# Patient Record
Sex: Female | Born: 1960 | Race: Black or African American | Hispanic: No | Marital: Married
Health system: Northeastern US, Academic
[De-identification: ages and names within clinical notes are randomized; demographics above are authoritative.]

## PROBLEM LIST (undated history)

## (undated) DIAGNOSIS — I10 Essential (primary) hypertension: Secondary | ICD-10-CM

## (undated) DIAGNOSIS — E119 Type 2 diabetes mellitus without complications: Secondary | ICD-10-CM

## (undated) DIAGNOSIS — G43909 Migraine, unspecified, not intractable, without status migrainosus: Secondary | ICD-10-CM

## (undated) DIAGNOSIS — J45909 Unspecified asthma, uncomplicated: Secondary | ICD-10-CM

## (undated) DIAGNOSIS — R0600 Dyspnea, unspecified: Secondary | ICD-10-CM

## (undated) HISTORY — PX: ABDOMINAL HYSTERECTOMY: SHX81

## (undated) HISTORY — PX: COLONOSCOPY: SHX5424

## (undated) HISTORY — PX: TOTAL ABDOMINAL HYSTERECTOMY: SHX209

## (undated) HISTORY — PX: CYSTECTOMY: SUR359

---

## 2014-08-25 ENCOUNTER — Encounter: Payer: Self-pay | Admitting: Emergency Medicine

## 2014-08-25 ENCOUNTER — Emergency Department
Admission: EM | Admit: 2014-08-25 | Discharge: 2014-08-25 | Disposition: A | Discharge status: Home / Self Care | Payer: 59 | Attending: Emergency Medicine | Admitting: Emergency Medicine

## 2014-08-25 ENCOUNTER — Emergency Department: Payer: 59

## 2014-08-25 ENCOUNTER — Other Ambulatory Visit: Payer: Self-pay

## 2014-08-25 DIAGNOSIS — R079 Chest pain, unspecified: Secondary | ICD-10-CM | POA: Diagnosis not present

## 2014-08-25 DIAGNOSIS — F329 Major depressive disorder, single episode, unspecified: Secondary | ICD-10-CM | POA: Diagnosis not present

## 2014-08-25 DIAGNOSIS — Z79899 Other long term (current) drug therapy: Secondary | ICD-10-CM | POA: Insufficient documentation

## 2014-08-25 DIAGNOSIS — I10 Essential (primary) hypertension: Secondary | ICD-10-CM | POA: Insufficient documentation

## 2014-08-25 DIAGNOSIS — E119 Type 2 diabetes mellitus without complications: Secondary | ICD-10-CM | POA: Diagnosis not present

## 2014-08-25 DIAGNOSIS — Z87891 Personal history of nicotine dependence: Secondary | ICD-10-CM | POA: Diagnosis not present

## 2014-08-25 HISTORY — DX: Type 2 diabetes mellitus without complications: E11.9

## 2014-08-25 HISTORY — DX: Essential (primary) hypertension: I10

## 2014-08-25 HISTORY — DX: Migraine, unspecified, not intractable, without status migrainosus: G43.909

## 2014-08-25 LAB — CBC
HEMATOCRIT: 36.7 % (ref 35.0–47.0)
Hemoglobin: 11.6 g/dL — ABNORMAL LOW (ref 12.0–16.0)
MCH: 26.8 pg (ref 26.0–34.0)
MCHC: 31.6 g/dL — ABNORMAL LOW (ref 32.0–36.0)
MCV: 84.7 fL (ref 80.0–100.0)
Platelets: 257 10*3/uL (ref 150–440)
RBC: 4.34 MIL/uL (ref 3.80–5.20)
RDW: 14.8 % — AB (ref 11.5–14.5)
WBC: 6.7 10*3/uL (ref 3.6–11.0)

## 2014-08-25 LAB — BASIC METABOLIC PANEL
ANION GAP: 8 (ref 5–15)
BUN: 19 mg/dL (ref 6–20)
CO2: 27 mmol/L (ref 22–32)
Calcium: 9 mg/dL (ref 8.9–10.3)
Chloride: 105 mmol/L (ref 101–111)
Creatinine, Ser: 0.99 mg/dL (ref 0.44–1.00)
GFR calc non Af Amer: >60 mL/min (ref >60)
Glucose, Bld: 167 mg/dL — ABNORMAL HIGH (ref 65–99)
POTASSIUM: 3.1 mmol/L — AB (ref 3.5–5.1)
Sodium: 140 mmol/L (ref 135–145)

## 2014-08-25 LAB — TROPONIN I

## 2014-08-25 MED ORDER — METOPROLOL TARTRATE 25 MG PO TABS
ORAL_TABLET | ORAL | Status: AC
  Administered 2014-08-25: 25 mg via ORAL
  Filled 2014-08-25: qty 1

## 2014-08-25 MED ORDER — METOPROLOL TARTRATE 25 MG PO TABS
25.0000 mg | ORAL_TABLET | Freq: Once | ORAL | Status: AC
  Administered 2014-08-25: 25 mg via ORAL
  Filled 2014-08-25: qty 1

## 2014-08-25 MED ORDER — METOPROLOL TARTRATE 25 MG PO TABS: 25.0000 mg | ORAL_TABLET | Freq: Two times a day (BID) | ORAL | Status: DC

## 2014-08-25 NOTE — ED Notes (Signed)
Pt reports that she developed mid-sternal chest pain yesterday. She states that it feels like an elephant sitting on her chest. Denies N/V, SOB or Diaphoresis. She has had problems with her K+.

## 2014-08-25 NOTE — Discharge Instructions (Signed)
No certain cause was found for your chest pain however your exam and evaluation are reassuring. Potassium is slightly low sodium, continue your potassium supplementation. Due to the you not being able to tolerate the metoprolol dose at 50 mg, you're being prescribed the metoprolol 25 mg twice a day dose. Discussed this with your current primary care doctor, or your new primary care doctor and you're referred to see the [facility]. With resected chest pain, I have recommended calling the cardiologist office here, or your previous cardiologist for appointment in 1-2 days for reevaluation for chest pain. Return to the emergency room for any new or worsening chest pain, nausea, shortness breath, trouble breathing, weakness, numbness, passing out, or any other symptoms concerning to you.  Chest Pain (Nonspecific) It is often hard to give a specific diagnosis for the cause of chest pain. There is always a chance that your pain could be related to something serious, such as a heart attack or a blood clot in the lungs. You need to follow up with your health care provider for further evaluation. CAUSES   Heartburn.  Pneumonia or bronchitis.  Anxiety or stress.  Inflammation around your heart (pericarditis) or lung (pleuritis or pleurisy).  A blood clot in the lung.  A collapsed lung (pneumothorax). It can develop suddenly on its own (spontaneous pneumothorax) or from trauma to the chest.  Shingles infection (herpes zoster virus). The chest wall is composed of bones, muscles, and cartilage. Any of these can be the source of the pain.  The bones can be bruised by injury.  The muscles or cartilage can be strained by coughing or overwork.  The cartilage can be affected by inflammation and become sore (costochondritis). DIAGNOSIS  Lab tests or other studies may be needed to find the cause of your pain. Your health care provider may have you take a test called an ambulatory electrocardiogram (ECG).  An ECG records your heartbeat patterns over a 24-hour period. You may also have other tests, such as:  Transthoracic echocardiogram (TTE). During echocardiography, sound waves are used to evaluate how blood flows through your heart.  Transesophageal echocardiogram (TEE).  Cardiac monitoring. This allows your health care provider to monitor your heart rate and rhythm in real time.  Holter monitor. This is a portable device that records your heartbeat and can help diagnose heart arrhythmias. It allows your health care provider to track your heart activity for several days, if needed.  Stress tests by exercise or by giving medicine that makes the heart beat faster. TREATMENT   Treatment depends on what may be causing your chest pain. Treatment may include:  Acid blockers for heartburn.  Anti-inflammatory medicine.  Pain medicine for inflammatory conditions.  Antibiotics if an infection is present.  You may be advised to change lifestyle habits. This includes stopping smoking and avoiding alcohol, caffeine, and chocolate.  You may be advised to keep your head raised (elevated) when sleeping. This reduces the chance of acid going backward from your stomach into your esophagus. Most of the time, nonspecific chest pain will improve within 2-3 days with rest and mild pain medicine.  HOME CARE INSTRUCTIONS   If antibiotics were prescribed, take them as directed. Finish them even if you start to feel better.  For the next few days, avoid physical activities that bring on chest pain. Continue physical activities as directed.  Do not use any tobacco products, including cigarettes, chewing tobacco, or electronic cigarettes.  Avoid drinking alcohol.  Only take medicine as  directed by your health care provider.  Follow your health care provider's suggestions for further testing if your chest pain does not go away.  Keep any follow-up appointments you made. If you do not go to an  appointment, you could develop lasting (chronic) problems with pain. If there is any problem keeping an appointment, call to reschedule. SEEK MEDICAL CARE IF:   Your chest pain does not go away, even after treatment.  You have a rash with blisters on your chest.  You have a fever. SEEK IMMEDIATE MEDICAL CARE IF:   You have increased chest pain or pain that spreads to your arm, neck, jaw, back, or abdomen.  You have shortness of breath.  You have an increasing cough, or you cough up blood.  You have severe back or abdominal pain.  You feel nauseous or vomit.  You have severe weakness.  You faint.  You have chills. This is an emergency. Do not wait to see if the pain will go away. Get medical help at once. Call your local emergency services (911 in [place] Do not drive yourself to the hospital. MAKE SURE YOU:   Understand these instructions.  Will watch your condition.  Will get help right away if you are not doing well or get worse. Document Released: 12/13/2004 Document Revised: 03/10/2013 Document Reviewed: 10/09/2007 [facility] Patient Information 2015 [place], [place]. This information is not intended to replace advice given to you by your health care provider. Make sure you discuss any questions you have with your health care provider.

## 2014-08-25 NOTE — ED Provider Notes (Signed)
[facility] Emergency Department Provider Note   ____________________________________________  Time seen: 6:45 PM I have reviewed the triage vital signs and the triage nursing note.  HISTORY  Chief Complaint Chest Pain   Historian Patient and significant other  HPI Lacey Schaefer is a 54 y.o. female who is been experiencing ongoing central chest pressure which is mild for about 2 days now. She has been seen by a cardiologist and her previous city of [facility] including a negative stress test about 4-5 months ago per the patient. She states she's felt like this before when she was low on potassium. She took a packet of potassium this morning. She has been under a lot of stress with taking care of her sister meaning to get her nursing home, and a recent death of her onto with numerous trips back and forth to the [facility]. She has had a little bit of depression, however she is not had any suicidal thoughts. She's had no shortness of breath or trouble breathing. She does have problems with gas and indigestion at times however she does not necessarily feel like this is due to indigestion. She had no nausea, sweating, or pleuritic chest pain. She has just recently moved back to [facility] and her primary doctor and cardiologist on [place] however she is looking to get referrals to doctors in this area. She was recently taken off of her Norvasc due to peripheral edema, and changed to metoprolol 50 mg twice daily. This was the second day and she did not like the way to metoprolol 50 mg May her feel she thought it was "too strong. She is interested in taking 25 mg twice a day.   Past Medical History  Diagnosis Date  . Diabetes mellitus without complication   . Hypertension   . Migraines     There are no active problems to display for this patient.   Past Surgical History  Procedure Laterality Date  . Abdominal hysterectomy      Current Outpatient Rx   Name  Route  Sig  Dispense  Refill  . metoprolol tartrate (LOPRESSOR) 25 MG tablet   Oral   Take 1 tablet (25 mg total) by mouth 2 (two) times daily.   28 tablet   0     Allergies Sulfa antibiotics  History reviewed. No pertinent family history.  Social History History  Substance Use Topics  . Smoking status: Former Research scientist (life sciences)  . Smokeless tobacco: Not on file  . Alcohol Use: No    Review of Systems  Constitutional: Negative for fever. Eyes: Negative for visual changes. ENT: Negative for sore throat. Cardiovascular: Negative for palpitations, exertional chest pain, or pleuritic chest pain Respiratory: Negative for shortness of breath. Gastrointestinal: Negative for abdominal pain, vomiting and diarrhea. Genitourinary: Negative for dysuria. Musculoskeletal: Negative for back pain. Skin: Negative for rash. Neurological: Negative for headaches, focal weakness or numbness.  ____________________________________________   PHYSICAL EXAM:  VITAL SIGNS: ED Triage Vitals  Enc Vitals Group     BP 08/25/14 1714 179/95 mmHg     Pulse Rate 08/25/14 1714 76     Resp 08/25/14 1714 18     Temp 08/25/14 1714 98.3 F (36.8 C)     Temp Source 08/25/14 1714 Oral     SpO2 08/25/14 1714 96 %     Weight 08/25/14 1714 189 lb (85.73 kg)     Height 08/25/14 1714 5\' 5"  (1.651 m)     Head Cir --  Peak Flow --      Pain Score 08/25/14 1715 9     Pain Loc --      Pain Edu? --      Excl. in [place]? --      Constitutional: Alert and oriented. Well appearing and in no distress. Eyes: Conjunctivae are normal. PERRL. Normal extraocular movements. ENT   Head: Normocephalic and atraumatic.   Nose: No congestion/rhinnorhea.   Mouth/Throat: Mucous membranes are moist.   Neck: No stridor. Cardiovascular: Normal rate, regular rhythm.  No murmurs, rubs, or gallops. Nontender to chest wall palpation Respiratory: Normal respiratory effort without tachypnea nor retractions. Breath  sounds are clear and equal bilaterally. No wheezes/rales/rhonchi. Gastrointestinal: Soft. No distention, no guarding, no rebound. Nontender  Genitourinary/rectal: Deferred Musculoskeletal: Nontender with normal range of motion in all extremities. No joint effusions.  No lower extremity tenderness nor edema. Neurologic:  Normal speech and language. No gross focal neurologic deficits are appreciated. Skin:  Skin is warm, dry and intact. No rash noted. Psychiatric: Mood and affect are normal. Speech and behavior are normal. Patient exhibits appropriate insight and judgment.  ____________________________________________   EKG  I, Lisa Roca, MD, the attending physician have personally viewed and interpreted this ECG.   76 bpm. Narrow QRS. Normal sinus rhythm. Normal axis. Normal ST and T-wave. ____________________________________________  LABS (pertinent positives/negatives)  White blood count normal at 6.7, hemoglobin 11.6 Medical panel significant only for slightly low potassium at 3.1 and a glucose of 157 Troponin less than 0.03  ____________________________________________  RADIOLOGY Radiologist results reviewed  Chest x-ray: Negative __________________________________________  PROCEDURES  Procedure(s) performed: None Critical Care performed: None  ____________________________________________   ED COURSE / ASSESSMENT AND PLAN  Pertinent labs & imaging results that were available during my care of the patient were reviewed by me and considered in my medical decision making (see chart for details).  Atypical chest pain without any associated symptoms and ongoing for 2 days with an normal EKG and negative troponin, I do not feel this is cardiac related. Patient was most concerned about having her potassium rechecked and it was a little bit low, however she is doing potassium supplementation on her own. I did refill a prescription for metoprolol at the lower dose which is  which was tried for blood pressure since back control. She did not take her Toprol today due to the fact that she felt like 50 mg was too much.  She has been under a lot of stress recently with some mild depression, however no need for emergency psychiatric evaluation. I will refer her to primary care here. She also is trying to decide if she is going to continue to follow with her primary care doctor in [place]. She is also referred to follow-up with a cardiologist either here or in [facility].    ___________________________________________   FINAL CLINICAL IMPRESSION(S) / ED DIAGNOSES   Final diagnoses:  Chest pain, unspecified chest pain type      Lisa Roca, MD 08/25/14 [id number]

## 2014-08-25 NOTE — ED Notes (Signed)
Pt states chest pain in mid chest for 2 days, no radiation, pt states no cardiac history, pt awake and alert during assessment

## 2014-09-26 ENCOUNTER — Emergency Department
Admission: EM | Admit: 2014-09-26 | Discharge: 2014-09-26 | Disposition: A | Discharge status: Home / Self Care | Payer: 59 | Attending: Student | Admitting: Student

## 2014-09-26 ENCOUNTER — Emergency Department: Payer: 59

## 2014-09-26 DIAGNOSIS — Y998 Other external cause status: Secondary | ICD-10-CM | POA: Insufficient documentation

## 2014-09-26 DIAGNOSIS — Y92512 Supermarket, store or market as the place of occurrence of the external cause: Secondary | ICD-10-CM | POA: Insufficient documentation

## 2014-09-26 DIAGNOSIS — Z87891 Personal history of nicotine dependence: Secondary | ICD-10-CM | POA: Insufficient documentation

## 2014-09-26 DIAGNOSIS — E119 Type 2 diabetes mellitus without complications: Secondary | ICD-10-CM | POA: Insufficient documentation

## 2014-09-26 DIAGNOSIS — W231XXA Caught, crushed, jammed, or pinched between stationary objects, initial encounter: Secondary | ICD-10-CM | POA: Insufficient documentation

## 2014-09-26 DIAGNOSIS — Z79899 Other long term (current) drug therapy: Secondary | ICD-10-CM | POA: Insufficient documentation

## 2014-09-26 DIAGNOSIS — I10 Essential (primary) hypertension: Secondary | ICD-10-CM | POA: Insufficient documentation

## 2014-09-26 DIAGNOSIS — S60222A Contusion of left hand, initial encounter: Secondary | ICD-10-CM | POA: Insufficient documentation

## 2014-09-26 DIAGNOSIS — Y9389 Activity, other specified: Secondary | ICD-10-CM | POA: Insufficient documentation

## 2014-09-26 MED ORDER — AMLODIPINE BESYLATE 10 MG PO TABS: 10.0000 mg | ORAL_TABLET | Freq: Every day | ORAL | Status: DC

## 2014-09-26 NOTE — ED Notes (Signed)
AAOx3.  Skin warm and dry.  No acute distress.  D/C home

## 2014-09-26 NOTE — Discharge Instructions (Signed)
Hand Contusion A hand contusion is a deep bruise on your hand area. Contusions are the result of an injury that caused bleeding under the skin. The contusion may turn blue, purple, or yellow. Minor injuries will give you a painless contusion, but more severe contusions may stay painful and swollen for a few weeks. CAUSES  A contusion is usually caused by a blow, trauma, or direct force to an area of the body. SYMPTOMS   Swelling and redness of the injured area.  Discoloration of the injured area.  Tenderness and soreness of the injured area.  Pain. DIAGNOSIS  The diagnosis can be made by taking a history and performing a physical exam. An X-ray, CT scan, or MRI may be needed to determine if there were any associated injuries, such as broken bones (fractures). TREATMENT  Often, the best treatment for a hand contusion is resting, elevating, icing, and applying cold compresses to the injured area. Over-the-counter medicines may also be recommended for pain control. HOME CARE INSTRUCTIONS   Put ice on the injured area.  Put ice in a plastic bag.  Place a towel between your skin and the bag.  Leave the ice on for 15-20 minutes, 03-04 times a day.  Only take over-the-counter or prescription medicines as directed by your caregiver. Your caregiver may recommend avoiding anti-inflammatory medicines (aspirin, ibuprofen, and naproxen) for 48 hours because these medicines may increase bruising.  If told, use an elastic wrap as directed. This can help reduce swelling. You may remove the wrap for sleeping, showering, and bathing. If your fingers become numb, cold, or blue, take the wrap off and reapply it more loosely.  Elevate your hand with pillows to reduce swelling.  Avoid overusing your hand if it is painful. SEEK IMMEDIATE MEDICAL CARE IF:   You have increased redness, swelling, or pain in your hand.  Your swelling or pain is not relieved with medicines.  You have loss of feeling in  your hand or are unable to move your fingers.  Your hand turns cold or blue.  You have pain when you move your fingers.  Your hand becomes warm to the touch.  Your contusion does not improve in 2 days. MAKE SURE YOU:   Understand these instructions.  Will watch your condition.  Will get help right away if you are not doing well or get worse. Document Released: 08/25/2001 Document Revised: 11/28/2011 Document Reviewed: 08/27/2011 [facility] Patient Information 2015 [place], [place]. This information is not intended to replace advice given to you by your health care provider. Make sure you discuss any questions you have with your health care provider.  Your exam and x-ray are normal today.  You have a minor contusion to the hands and minor strain to the fingers. Continue to take ibuprofen as needed. Ice the hand or soak in warm water as needed for comfort. See your provider as needed for follow-up.

## 2014-09-26 NOTE — ED Provider Notes (Signed)
[facility] Emergency Department Provider Note ____________________________________________  Time seen: 8527  I have reviewed the triage vital signs and the nursing notes.  HISTORY  Chief Complaint  Finger Injury  HPI Lacey Schaefer is a 54 y.o. female, right-handed, treat to the distal fingertips of the second third and fourth digits of her left hand. She describes that she was at the grocery store yesterday reaching into the commercial freezer, when she went to close the freezer door, her hand inadvertently slipped and was pinched under the handle of the freezer door. There was some pressure in the fingertips distally, but she was able to remove her hand from the door handle and the store manager applied ice pack to it. She is here today with c/o achiness to the distal fingertips. She reports normal movement, normal grip, and no distal paresthesias. She denies any other injury from her accident yesterday.He has a 7 out of 10 at triage.  Past Medical History  Diagnosis Date  . Diabetes mellitus without complication   . Hypertension   . Migraines     There are no active problems to display for this patient.   Past Surgical History  Procedure Laterality Date  . Abdominal hysterectomy      Current Outpatient Rx  Name  Route  Sig  Dispense  Refill  . metoprolol tartrate (LOPRESSOR) 25 MG tablet   Oral   Take 1 tablet (25 mg total) by mouth 2 (two) times daily.   28 tablet   0     Allergies Sulfa antibiotics  History reviewed. No pertinent family history.  Social History History  Substance Use Topics  . Smoking status: Former Research scientist (life sciences)  . Smokeless tobacco: Not on file  . Alcohol Use: No   Review of Systems  Constitutional: Negative for fever. Eyes: Negative for visual changes. ENT: Negative for sore throat. Cardiovascular: Negative for chest pain. Respiratory: Negative for shortness of breath. Gastrointestinal: Negative for abdominal pain,  vomiting and diarrhea. Genitourinary: Negative for dysuria. Musculoskeletal: Negative for back pain. Skin: Negative for rash. Left hand pain as above. Neurological: Negative for headaches, focal weakness or numbness. ____________________________________________  PHYSICAL EXAM:  VITAL SIGNS: ED Triage Vitals  Enc Vitals Group     BP 09/26/14 1511 189/95 mmHg     Pulse Rate 09/26/14 1511 74     Resp --      Temp --      Temp src --      SpO2 09/26/14 1511 99 %     Weight 09/26/14 1511 177 lb (80.287 kg)     Height 09/26/14 1511 5\' 4"  (1.626 m)     Head Cir --      Peak Flow --      Pain Score 09/26/14 1514 7     Pain Loc --      Pain Edu? --      Excl. in [place]? --    Constitutional: Alert and oriented. Well appearing and in no distress. Eyes: Normocephalic and atraumatic. Conjunctivae are normal. PERRL. Normal extraocular movements. No congestion/rhinnorhea. Mucous membranes are moist. Cardiovascular: Normal distal pulses Respiratory: Normal respiratory effort.  Musculoskeletal: Left hand and fingers without deformity, swelling, laceration, or abrasion. Nontender with normal range of motion in all fingers. Normal composite fists.  Neurologic:  Normal gross sensation. Normal intrinsic & opposition testing. Normal speech and language. No gross focal neurologic deficits are appreciated. Skin:  Skin is warm, dry and intact. No rash noted. Psychiatric: Mood and affect  are normal. Patient exhibits appropriate insight and judgment. ____________________________________________   RADIOLOGY  Left Hand IMPRESSION: Negative.  I, Tiffanie Blassingame, Dannielle Karvonen, personally viewed and evaluated these images as part of my medical decision making.  ____________________________________________  INITIAL IMPRESSION / ASSESSMENT AND PLAN / ED COURSE  Left hand contusion without evidence of fracture or neuromuscular deficit.  Treatment with ice and antiinflammatories.  Follow-up with primary provider  as needed. Provided the patient with a refill of her previous Norvasc, as she claims the current metoprolol causes hair thinning. She will follow-up with her PCP or [facility] as needed. ____________________________________________  FINAL CLINICAL IMPRESSION(S) / ED DIAGNOSES  Final diagnoses:  Hand contusion, left, initial encounter     Melvenia Needles, PA-C 09/26/14 1647  Joanne Gavel, MD 09/27/14 [phone]

## 2014-09-26 NOTE — ED Notes (Signed)
Pt from home c/o left hand finger pain following injury in freezer door.

## 2014-11-14 ENCOUNTER — Emergency Department: Payer: 59

## 2014-11-14 ENCOUNTER — Emergency Department
Admission: EM | Admit: 2014-11-14 | Discharge: 2014-11-14 | Disposition: A | Discharge status: Home / Self Care | Payer: 59 | Attending: Emergency Medicine | Admitting: Emergency Medicine

## 2014-11-14 ENCOUNTER — Encounter: Payer: Self-pay | Admitting: *Deleted

## 2014-11-14 DIAGNOSIS — Y998 Other external cause status: Secondary | ICD-10-CM | POA: Insufficient documentation

## 2014-11-14 DIAGNOSIS — Y92019 Unspecified place in single-family (private) house as the place of occurrence of the external cause: Secondary | ICD-10-CM | POA: Diagnosis not present

## 2014-11-14 DIAGNOSIS — I10 Essential (primary) hypertension: Secondary | ICD-10-CM | POA: Diagnosis not present

## 2014-11-14 DIAGNOSIS — E119 Type 2 diabetes mellitus without complications: Secondary | ICD-10-CM | POA: Insufficient documentation

## 2014-11-14 DIAGNOSIS — S4991XA Unspecified injury of right shoulder and upper arm, initial encounter: Secondary | ICD-10-CM | POA: Diagnosis present

## 2014-11-14 DIAGNOSIS — Y9389 Activity, other specified: Secondary | ICD-10-CM | POA: Diagnosis not present

## 2014-11-14 DIAGNOSIS — M19011 Primary osteoarthritis, right shoulder: Secondary | ICD-10-CM | POA: Insufficient documentation

## 2014-11-14 DIAGNOSIS — Z79899 Other long term (current) drug therapy: Secondary | ICD-10-CM | POA: Insufficient documentation

## 2014-11-14 DIAGNOSIS — Z87891 Personal history of nicotine dependence: Secondary | ICD-10-CM | POA: Diagnosis not present

## 2014-11-14 DIAGNOSIS — W1839XA Other fall on same level, initial encounter: Secondary | ICD-10-CM | POA: Insufficient documentation

## 2014-11-14 MED ORDER — TRAMADOL HCL 50 MG PO TABS: 50.0000 mg | ORAL_TABLET | Freq: Four times a day (QID) | ORAL | Status: DC | PRN

## 2014-11-14 MED ORDER — TRAMADOL HCL 50 MG PO TABS
50.0000 mg | ORAL_TABLET | Freq: Once | ORAL | Status: AC
  Administered 2014-11-14: 50 mg via ORAL
  Filled 2014-11-14: qty 1

## 2014-11-14 MED ORDER — MELOXICAM 15 MG PO TABS: 15.0000 mg | ORAL_TABLET | Freq: Every day | ORAL | Status: DC

## 2014-11-14 MED ORDER — NAPROXEN 500 MG PO TABS
ORAL_TABLET | ORAL | Status: AC
  Administered 2014-11-14: 500 mg via ORAL
  Filled 2014-11-14: qty 1

## 2014-11-14 MED ORDER — NAPROXEN 500 MG PO TABS
500.0000 mg | ORAL_TABLET | Freq: Two times a day (BID) | ORAL | Status: DC
  Administered 2014-11-14: 500 mg via ORAL

## 2014-11-14 NOTE — ED Notes (Signed)
Pt reports falling and injuring right shoulder

## 2014-11-14 NOTE — Discharge Instructions (Signed)

## 2014-11-14 NOTE — ED Provider Notes (Signed)
[facility] Emergency Department Provider Note ____________________________________________  Time seen: Approximately 11:15 AM  I have reviewed the triage vital signs and the nursing notes.   HISTORY  Chief Complaint Shoulder Pain   HPI Lacey Schaefer is a 54 y.o. female who presents to the emergency department for evaluation of right shoulder pain. She states that she fell on Friday on a carpeted floor at her house and landed on her right side. Pain did not start until last night. Pain is mainly with attempting to raise her right arm above her head. She denies previous shoulder injury or pain. She took Tylenol without any relief. She then took ibuprofen 600 mg with some relief.   Past Medical History  Diagnosis Date  . Diabetes mellitus without complication   . Hypertension   . Migraines     There are no active problems to display for this patient.   Past Surgical History  Procedure Laterality Date  . Abdominal hysterectomy      Current Outpatient Rx  Name  Route  Sig  Dispense  Refill  . amLODipine (NORVASC) 10 MG tablet   Oral   Take 1 tablet (10 mg total) by mouth daily.   30 tablet   0   . meloxicam (MOBIC) 15 MG tablet   Oral   Take 1 tablet (15 mg total) by mouth daily.   30 tablet   2   . metoprolol tartrate (LOPRESSOR) 25 MG tablet   Oral   Take 1 tablet (25 mg total) by mouth 2 (two) times daily.   28 tablet   0   . traMADol (ULTRAM) 50 MG tablet   Oral   Take 1 tablet (50 mg total) by mouth every 6 (six) hours as needed.   9 tablet   0     Allergies Sulfa antibiotics  No family history on file.  Social History Social History  Substance Use Topics  . Smoking status: Former Research scientist (life sciences)  . Smokeless tobacco: None  . Alcohol Use: No    Review of Systems Constitutional: No recent illness. Eyes: No visual changes. ENT: No sore throat. Cardiovascular: Denies chest pain or palpitations. Respiratory: Denies shortness of  breath. Gastrointestinal: No abdominal pain.  Genitourinary: Negative for dysuria. Musculoskeletal: Pain in right shoulder Skin: Negative for rash. Neurological: Negative for headaches, focal weakness or numbness. 10-point ROS otherwise negative.  ____________________________________________   PHYSICAL EXAM:  VITAL SIGNS: ED Triage Vitals  Enc Vitals Group     BP 11/14/14 1059 155/86 mmHg     Pulse Rate 11/14/14 1059 88     Resp --      Temp 11/14/14 1059 98 F (36.7 C)     Temp Source 11/14/14 1059 Oral     SpO2 11/14/14 1059 96 %     Weight 11/14/14 1059 179 lb (81.194 kg)     Height 11/14/14 1059 5\' 4"  (1.626 m)     Head Cir --      Peak Flow --      Pain Score 11/14/14 1054 7     Pain Loc --      Pain Edu? --      Excl. in [place]? --     Constitutional: Alert and oriented. Well appearing and in no acute distress. Eyes: Conjunctivae are normal. EOMI. Head: Atraumatic. Nose: No congestion/rhinnorhea. Neck: No stridor.  Respiratory: Normal respiratory effort.   Musculoskeletal: Unable to abduct the right arm due to pain. No stepdown or obvious deformity of  the right shoulder. Neurologic:  Normal speech and language. No gross focal neurologic deficits are appreciated. Speech is normal. No gait instability. Skin:  Skin is warm, dry and intact. Atraumatic. Psychiatric: Mood and affect are normal. Speech and behavior are normal.  ____________________________________________   LABS (all labs ordered are listed, but only abnormal results are displayed)  Labs Reviewed - No data to display ____________________________________________  RADIOLOGY  Right shoulder x-ray negative for acute bony abnormality. ____________________________________________   PROCEDURES  Procedure(s) performed: Sling applied by ER tech.   ____________________________________________   INITIAL IMPRESSION / ASSESSMENT AND PLAN / ED COURSE  Pertinent labs & imaging results that were  available during my care of the patient were reviewed by me and considered in my medical decision making (see chart for details).  Visit was advised to follow-up with orthopedics for symptoms that are not improving over the next few days. She was advised to return to emergency department for symptoms that change or worsen if she is unable schedule an appointment. ____________________________________________   FINAL CLINICAL IMPRESSION(S) / ED DIAGNOSES  Final diagnoses:  Primary osteoarthritis of right shoulder       Victorino Dike, FNP 11/14/14 1329  Daymon Larsen, MD 11/14/14 1345

## 2015-08-12 DIAGNOSIS — M79643 Pain in unspecified hand: Secondary | ICD-10-CM | POA: Insufficient documentation

## 2015-08-31 IMAGING — CR DG SHOULDER 2+V*R*
1 series · 3 of 3 positions shown · non-contrast
Comparison: None.

CLINICAL DATA: Fall 2 weeks ago, landing on right side.
Progressively worsening right shoulder pain.

EXAM:
RIGHT SHOULDER - 2+ VIEW

[Series 1: w shoulder external right · 0.14mm/px · 3 of 3 slices shown]
[im 1/3]
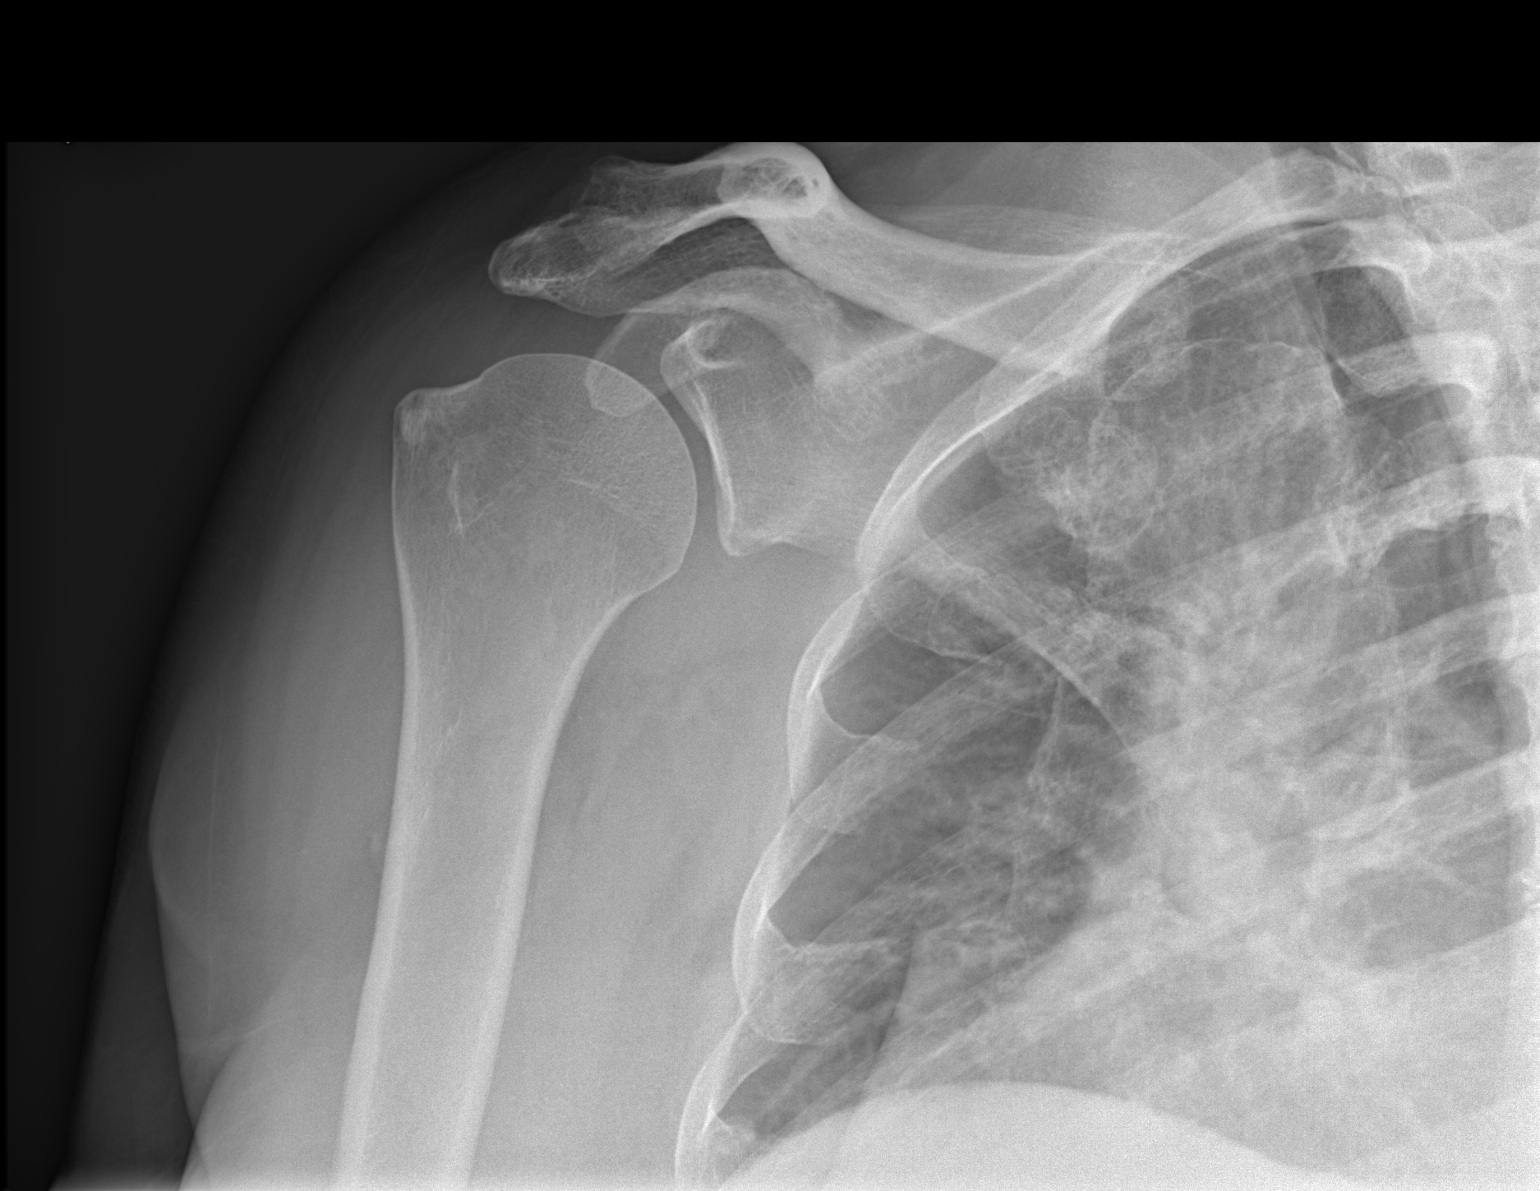
[im 2/3]
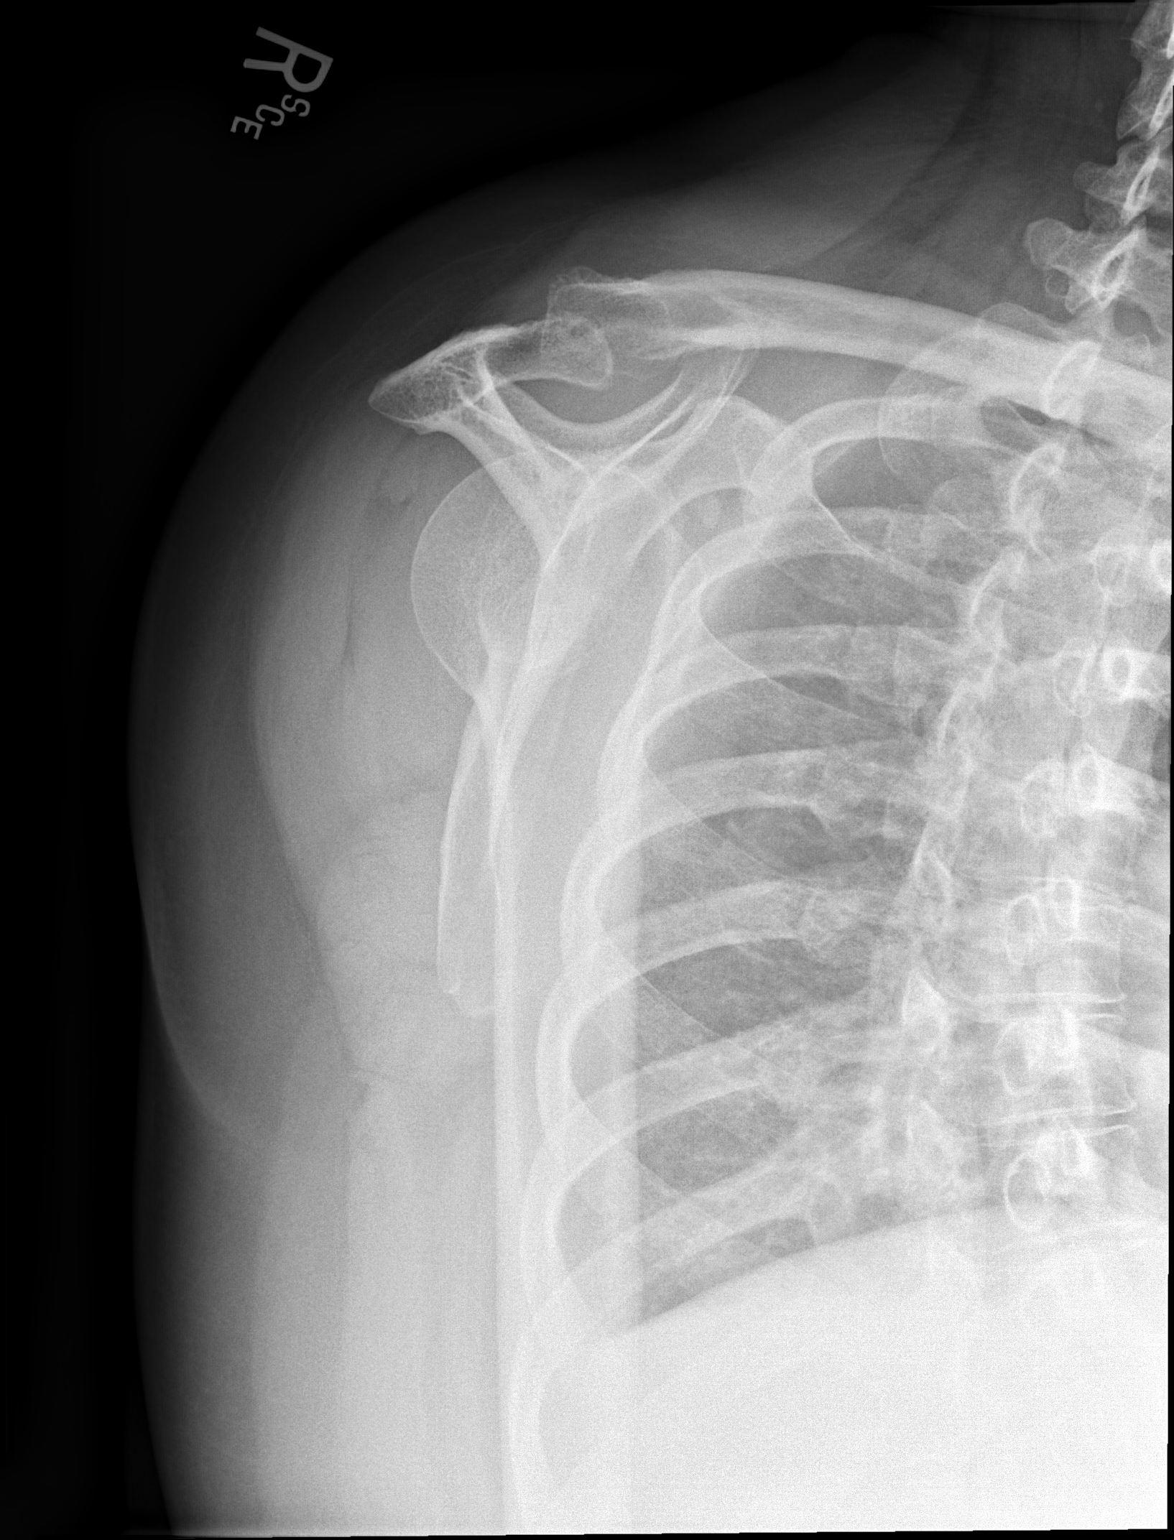
[im 3/3]
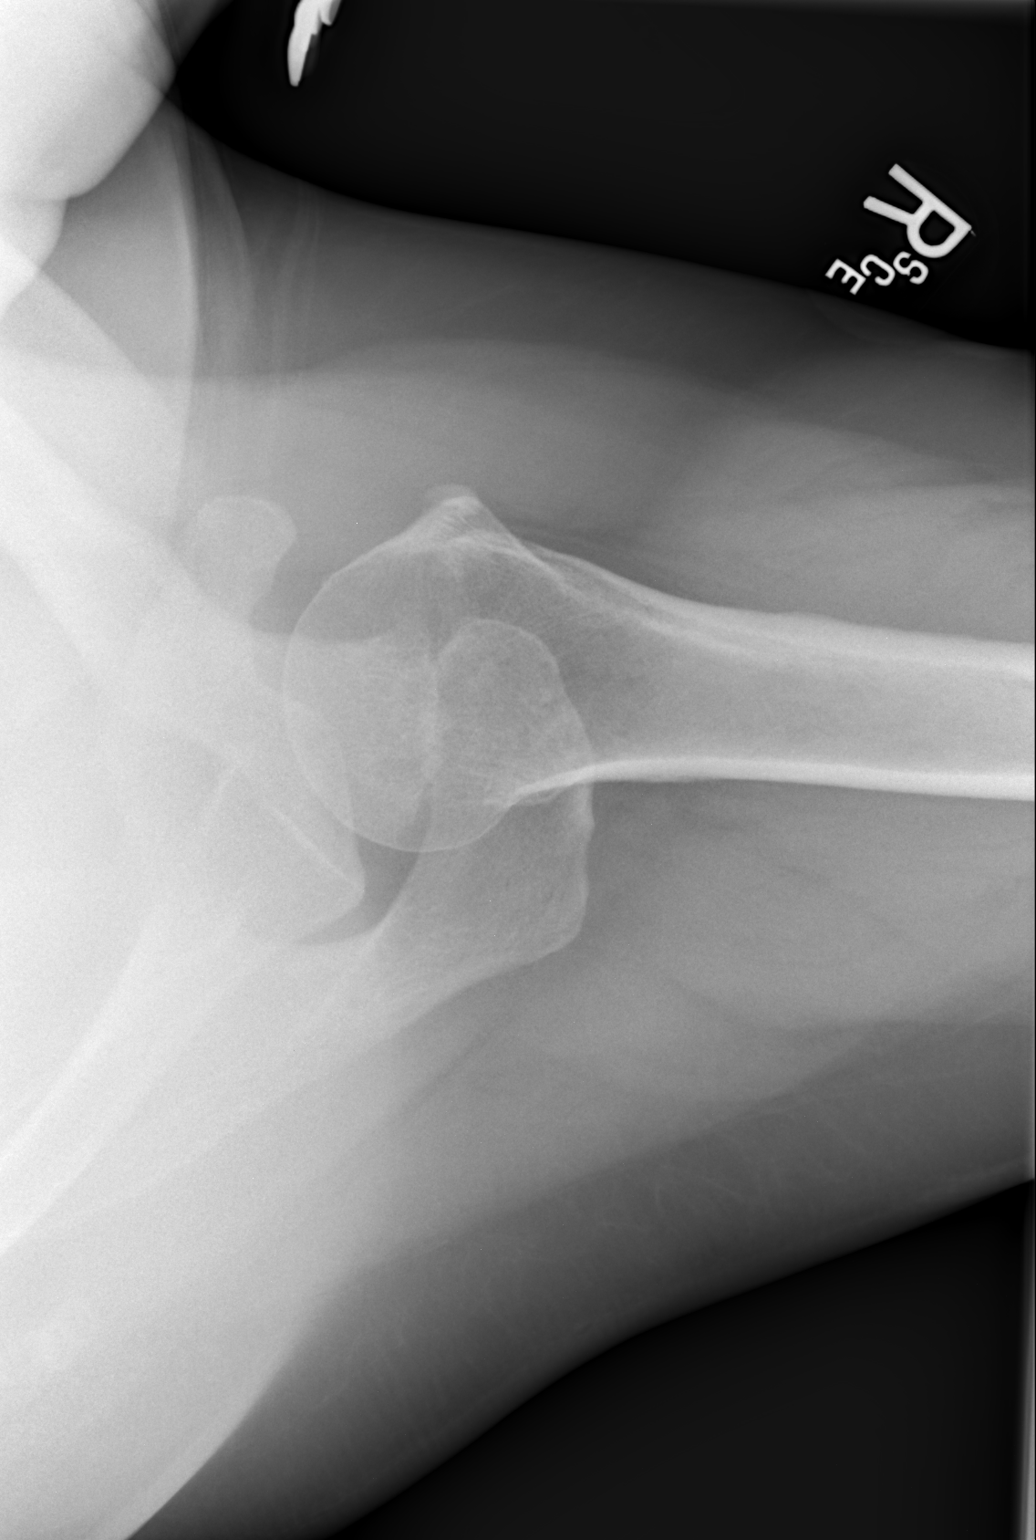

[3 of 3 positions shown; findings below may reference images not displayed]

FINDINGS: Degenerative changes in the right AC joint. Glenohumeral joint is
intact. No acute bony abnormality. Specifically, no fracture,
subluxation, or dislocation. Soft tissues are intact.
IMPRESSION: No acute bony abnormality.

## 2016-07-21 LAB — HM HIV SCREENING LAB: HM HIV Screening: NEGATIVE

## 2016-10-24 DIAGNOSIS — M545 Low back pain, unspecified: Secondary | ICD-10-CM | POA: Insufficient documentation

## 2016-10-24 DIAGNOSIS — G8929 Other chronic pain: Secondary | ICD-10-CM | POA: Insufficient documentation

## 2018-08-18 DIAGNOSIS — I208 Other forms of angina pectoris: Secondary | ICD-10-CM | POA: Insufficient documentation

## 2018-08-18 DIAGNOSIS — R079 Chest pain, unspecified: Secondary | ICD-10-CM | POA: Insufficient documentation

## 2018-08-19 ENCOUNTER — Ambulatory Visit: Payer: Self-pay | Admitting: Family Medicine

## 2018-08-21 ENCOUNTER — Encounter: Payer: Self-pay | Admitting: Family Medicine

## 2018-08-21 ENCOUNTER — Other Ambulatory Visit: Payer: Self-pay

## 2018-08-21 ENCOUNTER — Ambulatory Visit (INDEPENDENT_AMBULATORY_CARE_PROVIDER_SITE_OTHER): Payer: Self-pay | Admitting: Family Medicine

## 2018-08-21 VITALS — BP 140/78 | HR 90 | Temp 99.2°F | Ht 63.0 in | Wt 194.0 lb

## 2018-08-21 DIAGNOSIS — I10 Essential (primary) hypertension: Secondary | ICD-10-CM

## 2018-08-21 DIAGNOSIS — E1159 Type 2 diabetes mellitus with other circulatory complications: Secondary | ICD-10-CM

## 2018-08-21 DIAGNOSIS — I152 Hypertension secondary to endocrine disorders: Secondary | ICD-10-CM

## 2018-08-21 DIAGNOSIS — E119 Type 2 diabetes mellitus without complications: Secondary | ICD-10-CM

## 2018-08-21 DIAGNOSIS — R079 Chest pain, unspecified: Secondary | ICD-10-CM

## 2018-08-21 DIAGNOSIS — Z7689 Persons encountering health services in other specified circumstances: Secondary | ICD-10-CM

## 2018-08-21 NOTE — Progress Notes (Signed)
BP 140/78   Pulse 90   Temp 99.2 F (37.3 C) (Oral)   Ht 5\' 3"  (1.6 m)   Wt 194 lb (88 kg)   SpO2 98%   BMI 34.37 kg/m    Subjective:    Patient ID: Lacey Schaefer, female    DOB: 04/05/1960, 58 y.o.   MRN: [id number]  HPI: Lacey Schaefer is a 58 y.o. female  Chief Complaint  Patient presents with  . Establish Care    pt would like to discuss about BP   Had some chest pains over the weekend that have since resolved. Went to UC for this where EKG and labs did not reveal any acute issues. Patient thinks it may have been gas pains. Was previously followed by Cardiology for angina back in 2016 when she lived in the area and again by another Cardiologist while living in [place]. Wanting to re-establish since she's moved back. Denies diaphoresis, nausea, arm pain, SOB.   HTN - currently on hydralazine BID, amlodipine, and HCTZ (alternates full tab and half tab every other day due to urge incontinence side effects). Home BP readings have been around 130s/70s- 150s/80s. Denies CP, SOB, dizziness, HAs.   DM - Was taken off glimepiride due to hypoglycemic episodes. Currently taking janumet, which she cuts in half and takes twice daily due to stomach upset with full dose of metformin. Last A1C was at 6.5 about 4-5 months ago per patient. Home BSs are running around 90-120 range. Tries hard to eat well and stay active. No low blood sugar spells since d/c of glimeperide.   Relevant past medical, surgical, family and social history reviewed and updated as indicated. Interim medical history since our last visit reviewed. Allergies and medications reviewed and updated.  Review of Systems  Per HPI unless specifically indicated above     Objective:    BP 140/78   Pulse 90   Temp 99.2 F (37.3 C) (Oral)   Ht 5\' 3"  (1.6 m)   Wt 194 lb (88 kg)   SpO2 98%   BMI 34.37 kg/m   Wt Readings from Last 3 Encounters:  08/21/18 194 lb (88 kg)  11/14/14 179 lb (81.2 kg)  09/26/14 177 lb (80.3 kg)     Physical Exam Vitals signs and nursing note reviewed.  Constitutional:      Appearance: Normal appearance. She is not ill-appearing.  HENT:     Head: Atraumatic.  Eyes:     Extraocular Movements: Extraocular movements intact.     Conjunctiva/sclera: Conjunctivae normal.  Neck:     Musculoskeletal: Normal range of motion and neck supple.  Cardiovascular:     Rate and Rhythm: Normal rate and regular rhythm.     Heart sounds: Normal heart sounds.  Pulmonary:     Effort: Pulmonary effort is normal.     Breath sounds: Normal breath sounds.  Musculoskeletal: Normal range of motion.  Skin:    General: Skin is warm and dry.  Neurological:     Mental Status: She is alert and oriented to person, place, and time.  Psychiatric:        Mood and Affect: Mood normal.        Thought Content: Thought content normal.        Judgment: Judgment normal.     Results for orders placed or performed during the hospital encounter of 08/25/14  CBC  Result Value Ref Range   WBC 6.7 3.6 - 11.0 K/uL   RBC 4.34 3.80 -  5.20 MIL/uL   Hemoglobin 11.6 (L) 12.0 - 16.0 g/dL   HCT 36.7 35.0 - 47.0 %   MCV 84.7 80.0 - 100.0 fL   MCH 26.8 26.0 - 34.0 pg   MCHC 31.6 (L) 32.0 - 36.0 g/dL   RDW 14.8 (H) 11.5 - 14.5 %   Platelets 257 150 - 440 K/uL  Basic metabolic panel  Result Value Ref Range   Sodium 140 135 - 145 mmol/L   Potassium 3.1 (L) 3.5 - 5.1 mmol/L   Chloride 105 101 - 111 mmol/L   CO2 27 22 - 32 mmol/L   Glucose, Bld 167 (H) 65 - 99 mg/dL   BUN 19 6 - 20 mg/dL   Creatinine, Ser 0.99 0.44 - 1.00 mg/dL   Calcium 9.0 8.9 - 10.3 mg/dL   GFR calc non Af Amer >60 >60 mL/min   GFR calc Af Amer >60 >60 mL/min   Anion gap 8 5 - 15  Troponin I  Result Value Ref Range   Troponin I <0.03 <0.031 ng/mL      Assessment & Plan:   Problem List Items Addressed This Visit      Cardiovascular and Mediastinum   Hypertension associated with diabetes (HCC)    Increase HCTZ to 25 mg daily. Continue  current regimen as is otherwise. If incontinence becomes too significant, will d/c and start a different medication. Log home readings, DASH diet, exercise      Relevant Medications   hydrALAZINE (APRESOLINE) 50 MG tablet   sitaGLIPtin-metformin (JANUMET) 50-1000 MG tablet   hydrochlorothiazide (HYDRODIURIL) 25 MG tablet     Endocrine   Diabetes mellitus without complication ([place])    Stay off glimeperide due to hx of hypoglycemia. Continue janumet and lifestyle modifications. Will recheck labs in 1 month      Relevant Medications   sitaGLIPtin-metformin (JANUMET) 50-1000 MG tablet    Other Visit Diagnoses    Chest pain, unspecified type    -  Primary   UC workup neg for acute cardiac cause, sxs resolved. Referral back to Cardiology placed per her request. Return precautions given   Relevant Orders   Ambulatory referral to Cardiology   Encounter to establish care           Follow up plan: Return in about 4 weeks (around 09/18/2018) for BP, DM, Lipid.

## 2018-08-22 DIAGNOSIS — E1159 Type 2 diabetes mellitus with other circulatory complications: Secondary | ICD-10-CM | POA: Insufficient documentation

## 2018-08-22 DIAGNOSIS — E1165 Type 2 diabetes mellitus with hyperglycemia: Secondary | ICD-10-CM | POA: Insufficient documentation

## 2018-08-22 DIAGNOSIS — E1169 Type 2 diabetes mellitus with other specified complication: Secondary | ICD-10-CM | POA: Insufficient documentation

## 2018-08-22 DIAGNOSIS — E119 Type 2 diabetes mellitus without complications: Secondary | ICD-10-CM | POA: Insufficient documentation

## 2018-08-22 DIAGNOSIS — I152 Hypertension secondary to endocrine disorders: Secondary | ICD-10-CM | POA: Insufficient documentation

## 2018-08-22 NOTE — Assessment & Plan Note (Signed)
Stay off glimeperide due to hx of hypoglycemia. Continue janumet and lifestyle modifications. Will recheck labs in 1 month

## 2018-08-22 NOTE — Assessment & Plan Note (Signed)
Increase HCTZ to 25 mg daily. Continue current regimen as is otherwise. If incontinence becomes too significant, will d/c and start a different medication. Log home readings, DASH diet, exercise

## 2018-08-26 ENCOUNTER — Telehealth: Payer: Self-pay | Admitting: Family Medicine

## 2018-08-26 NOTE — Telephone Encounter (Signed)
I did in fact listen to her heart and lungs during her OV, but plan to do so at her upcoming follow up as well.   Copied from [place] [phone]. Topic: General - Other >> Aug 21, 2018  4:52 PM Parke Poisson wrote: Reason for CRM: Pt states that while she was in office you did not listen to her lungs.She wants to know if that needs to be done If not she will see you at next visit.She also wanted to let you know it was nice meeting you.You are very nice >> Aug 26, 2018 10:34 AM Don Perking M wrote: Verbal message was relayed to PCP on 08/21/18.

## 2018-09-08 ENCOUNTER — Telehealth: Payer: Self-pay | Admitting: Family Medicine

## 2018-09-08 MED ORDER — HYDROCHLOROTHIAZIDE 25 MG PO TABS: 25.0000 mg | ORAL_TABLET | Freq: Every day | ORAL | 1 refills | Status: DC

## 2018-09-08 MED ORDER — ALBUTEROL SULFATE HFA 108 (90 BASE) MCG/ACT IN AERS: 2.0000 | INHALATION_SPRAY | Freq: Four times a day (QID) | RESPIRATORY_TRACT | 1 refills | Status: DC | PRN

## 2018-09-08 NOTE — Telephone Encounter (Signed)
Rx's sent - not sure what she wanted a call back about but please see if she has additional questions and let her know there are no samples

## 2018-09-08 NOTE — Telephone Encounter (Signed)
REFILL hydrochlorothiazide (HYDRODIURIL) 25 [facility]  [place] [address] [place] [phone] (Phone) [phone] (Fax)

## 2018-09-08 NOTE — Telephone Encounter (Signed)
Relation to pt: self  Call back number: [phone] Pharmacy: [place], [place] - [place] [phone] (Phone) [phone] (Fax)     Reason for call:  Patient states she's completely out of hydrochlorothiazide (HYDRODIURIL) 25 MG tablet and  pro-air, patient informed please allow 48 hour turn around time. Patient states she would like to speak with nurse today. Patient also asked if there's any samples of BP medication

## 2018-09-09 NOTE — Telephone Encounter (Signed)
Called and left a detailed message for patient.  

## 2018-09-24 ENCOUNTER — Ambulatory Visit: Payer: Self-pay | Admitting: Family Medicine

## 2019-07-20 ENCOUNTER — Encounter: Admit: 2019-07-20 | Payer: PRIVATE HEALTH INSURANCE | Attending: Adult Health | Primary: Family Medicine

## 2019-07-20 MED ORDER — POTASSIUM CHLORIDE 20 MEQ ORAL PACKET
20 mEq | PACK | 4 refills | Status: AC
Start: 2019-07-20 — End: 2019-08-24

## 2019-07-21 ENCOUNTER — Telehealth: Payer: Self-pay | Admitting: Family Medicine

## 2019-07-21 NOTE — Telephone Encounter (Signed)
Copied from [place] [phone]. Topic: Appointment Scheduling - Scheduling Inquiry for Clinic >> Jul 17, 2019  3:52 PM Sheran Luz wrote: Patient would like to know if she could establish care with Dr. Wynetta Emery, as she states her husband is a current patient. Please advise. >> Jul 21, 2019  2:18 PM Stark Klein wrote: Forwarding crm for approval.

## 2019-07-21 NOTE — Telephone Encounter (Signed)
Looks like she already established with Apolonio Schneiders

## 2019-07-21 NOTE — Telephone Encounter (Signed)
Lvm for pt to call back. Pt scheduled with Apolonio Schneiders tomorrow

## 2019-07-22 ENCOUNTER — Other Ambulatory Visit: Payer: Self-pay

## 2019-07-22 ENCOUNTER — Ambulatory Visit: Payer: Self-pay | Admitting: Family Medicine

## 2019-07-22 ENCOUNTER — Telehealth: Payer: Self-pay | Admitting: Family Medicine

## 2019-07-22 ENCOUNTER — Encounter: Payer: Self-pay | Admitting: Family Medicine

## 2019-07-22 ENCOUNTER — Ambulatory Visit (INDEPENDENT_AMBULATORY_CARE_PROVIDER_SITE_OTHER): Payer: Self-pay | Admitting: Family Medicine

## 2019-07-22 VITALS — BP 130/88 | HR 85 | Temp 99.0°F | Ht 64.09 in | Wt 192.0 lb

## 2019-07-22 DIAGNOSIS — E1169 Type 2 diabetes mellitus with other specified complication: Secondary | ICD-10-CM

## 2019-07-22 DIAGNOSIS — E785 Hyperlipidemia, unspecified: Secondary | ICD-10-CM

## 2019-07-22 DIAGNOSIS — I10 Essential (primary) hypertension: Secondary | ICD-10-CM

## 2019-07-22 DIAGNOSIS — E1159 Type 2 diabetes mellitus with other circulatory complications: Secondary | ICD-10-CM

## 2019-07-22 DIAGNOSIS — I152 Hypertension secondary to endocrine disorders: Secondary | ICD-10-CM

## 2019-07-22 DIAGNOSIS — E119 Type 2 diabetes mellitus without complications: Secondary | ICD-10-CM

## 2019-07-22 MED ORDER — ALBUTEROL SULFATE HFA 108 (90 BASE) MCG/ACT IN AERS: 2.0000 | INHALATION_SPRAY | Freq: Four times a day (QID) | RESPIRATORY_TRACT | 1 refills | Status: DC | PRN

## 2019-07-22 MED ORDER — SITAGLIPTIN PHOS-METFORMIN HCL 50-1000 MG PO TABS: 1.0000 | ORAL_TABLET | Freq: Every day | ORAL | 5 refills | Status: DC

## 2019-07-22 MED ORDER — SITAGLIPTIN PHOS-METFORMIN HCL 50-1000 MG PO TABS: 1.0000 | ORAL_TABLET | Freq: Two times a day (BID) | ORAL | 5 refills | Status: DC

## 2019-07-22 NOTE — Telephone Encounter (Signed)
Patient called back to say that she will be in the neighborhood and will stop by the office to get her heart and lungs listened to if possible. Any questions please call patient

## 2019-07-22 NOTE — Progress Notes (Signed)
BP 130/88   Pulse 85   Temp 99 F (37.2 C)   Ht 5' 4.09" (1.628 m)   Wt 192 lb (87.1 kg)   SpO2 98%   BMI 32.86 kg/m    Subjective:    Patient ID: Lacey Schaefer, female    DOB: June 11, 1960, 59 y.o.   MRN: [id number]  HPI: Lacey Schaefer is a 59 y.o. female  Chief Complaint  Patient presents with  . Diabetes   Here today for overdue chronic condition f/u.   Home BPs running 120-130/80s pretty consistently. Taking medicines faithfully without side effects. Denies CP, SOB, HAs, dizziness.   DM - Taking janumet faithfully and trying to eat well and stay active. Home BSs running from 120 - 185 range typically. No true hypoglycemic episodes but has forgotten to eat at times and can feel fatigue and hunger that tips her off .   HLD - on pravastatin, tolerating well. Denies claudication, myalgias.   Relevant past medical, surgical, family and social history reviewed and updated as indicated. Interim medical history since our last visit reviewed. Allergies and medications reviewed and updated.  Review of Systems  Per HPI unless specifically indicated above     Objective:    BP 130/88   Pulse 85   Temp 99 F (37.2 C)   Ht 5' 4.09" (1.628 m)   Wt 192 lb (87.1 kg)   SpO2 98%   BMI 32.86 kg/m   Wt Readings from Last 3 Encounters:  07/22/19 192 lb (87.1 kg)  08/21/18 194 lb (88 kg)  11/14/14 179 lb (81.2 kg)    Physical Exam Vitals and nursing note reviewed.  Constitutional:      Appearance: Normal appearance. She is not ill-appearing.  HENT:     Head: Atraumatic.  Eyes:     Extraocular Movements: Extraocular movements intact.     Conjunctiva/sclera: Conjunctivae normal.  Cardiovascular:     Rate and Rhythm: Normal rate and regular rhythm.     Heart sounds: Normal heart sounds.  Pulmonary:     Effort: Pulmonary effort is normal.     Breath sounds: Normal breath sounds.  Musculoskeletal:        General: Normal range of motion.     Cervical back: Normal range of  motion and neck supple.  Skin:    General: Skin is warm and dry.  Neurological:     Mental Status: She is alert and oriented to person, place, and time.  Psychiatric:        Mood and Affect: Mood normal.        Thought Content: Thought content normal.        Judgment: Judgment normal.     Results for orders placed or performed in visit on 07/22/19  HM HIV SCREENING LAB  Result Value Ref Range   HM HIV Screening Negative - Patient reported   Comprehensive metabolic panel  Result Value Ref Range   Glucose 197 (H) 65 - 99 mg/dL   BUN 15 8 - 27 mg/dL   Creatinine, Ser 0.76 0.57 - 1.00 mg/dL   GFR calc non Af Amer 86 >59 mL/min/1.73   GFR calc Af Amer 99 >59 mL/min/1.73   BUN/Creatinine Ratio 20 12 - 28   Sodium 140 134 - 144 mmol/L   Potassium 3.8 3.5 - 5.2 mmol/L   Chloride 99 96 - 106 mmol/L   CO2 26 20 - 29 mmol/L   Calcium 9.9 8.7 - 10.3 mg/dL   Total Protein  7.2 6.0 - 8.5 g/dL   Albumin 4.6 3.8 - 4.9 g/dL   Globulin, Total 2.6 1.5 - 4.5 g/dL   Albumin/Globulin Ratio 1.8 1.2 - 2.2   Bilirubin Total 0.3 0.0 - 1.2 mg/dL   Alkaline Phosphatase 72 39 - 117 IU/L   AST 14 0 - 40 IU/L   ALT 19 0 - 32 IU/L  HgB A1c  Result Value Ref Range   Hgb A1c MFr Bld 8.1 (H) 4.8 - 5.6 %   Est. average glucose Bld gHb Est-mCnc 186 mg/dL  Lipid Panel w/o Chol/HDL Ratio  Result Value Ref Range   Cholesterol, Total 220 (H) 100 - 199 mg/dL   Triglycerides 153 (H) 0 - 149 mg/dL   HDL 59 >39 mg/dL   VLDL Cholesterol Cal 27 5 - 40 mg/dL   LDL Chol Calc (NIH) 134 (H) 0 - 99 mg/dL      Assessment & Plan:   Problem List Items Addressed This Visit      Cardiovascular and Mediastinum   Hypertension associated with diabetes ([place]) - Primary    BPs typically stable and WNL, continue current regimen and lifestyle modifications      Relevant Medications   amLODipine (NORVASC) 10 MG tablet   carvedilol (COREG) 3.125 MG tablet   sitaGLIPtin-metformin (JANUMET) 50-1000 MG tablet   Other  Relevant Orders   Comprehensive metabolic panel (Completed)     Endocrine   Diabetes mellitus without complication (HCC)    Recheck lipids, adjust as needed. Continue current regimen      Relevant Medications   sitaGLIPtin-metformin (JANUMET) 50-1000 MG tablet   Other Relevant Orders   HgB A1c (Completed)   Hyperlipidemia associated with type 2 diabetes mellitus (HCC)    Recheck lipids, adjust as needed. Continue current regimen and lifestyle modifications      Relevant Medications   sitaGLIPtin-metformin (JANUMET) 50-1000 MG tablet   Other Relevant Orders   Lipid Panel w/o Chol/HDL Ratio (Completed)       Follow up plan: Return in about 6 months (around 01/22/2020) for 6 month f/u.

## 2019-07-22 NOTE — Telephone Encounter (Signed)
Pt was seen again in office at 4:15, examined and more questions answered

## 2019-07-22 NOTE — Telephone Encounter (Signed)
Copied from [place] [id number]. Topic: General - Inquiry >> Jul 22, 2019  2:07 PM Lacey Schaefer wrote: Reason for CRM: pt called in and stated she was seen today.  She stated that her heart and lungs were not listened to and would like them checked .  Please advise  Best number is [phone] Pt was seen today 07/22/2019 at 10:00

## 2019-07-23 ENCOUNTER — Other Ambulatory Visit: Payer: Self-pay | Admitting: Family Medicine

## 2019-07-23 LAB — COMPREHENSIVE METABOLIC PANEL
ALT: 19 IU/L (ref 0–32)
AST: 14 IU/L (ref 0–40)
Albumin/Globulin Ratio: 1.8 (ref 1.2–2.2)
Albumin: 4.6 g/dL (ref 3.8–4.9)
Alkaline Phosphatase: 72 IU/L (ref 39–117)
BUN/Creatinine Ratio: 20 (ref 12–28)
BUN: 15 mg/dL (ref 8–27)
Bilirubin Total: 0.3 mg/dL (ref 0.0–1.2)
CO2: 26 mmol/L (ref 20–29)
Calcium: 9.9 mg/dL (ref 8.7–10.3)
Chloride: 99 mmol/L (ref 96–106)
Creatinine, Ser: 0.76 mg/dL (ref 0.57–1.00)
GFR calc Af Amer: 99 mL/min/{1.73_m2} (ref >59)
GFR calc non Af Amer: 86 mL/min/{1.73_m2} (ref >59)
Globulin, Total: 2.6 g/dL (ref 1.5–4.5)
Glucose: 197 mg/dL — ABNORMAL HIGH (ref 65–99)
Potassium: 3.8 mmol/L (ref 3.5–5.2)
Sodium: 140 mmol/L (ref 134–144)
Total Protein: 7.2 g/dL (ref 6.0–8.5)

## 2019-07-23 LAB — LIPID PANEL W/O CHOL/HDL RATIO
Cholesterol, Total: 220 mg/dL — ABNORMAL HIGH (ref 100–199)
HDL: 59 mg/dL (ref >39)
LDL Chol Calc (NIH): 134 mg/dL — ABNORMAL HIGH (ref 0–99)
Triglycerides: 153 mg/dL — ABNORMAL HIGH (ref 0–149)
VLDL Cholesterol Cal: 27 mg/dL (ref 5–40)

## 2019-07-23 LAB — HEMOGLOBIN A1C
Est. average glucose Bld gHb Est-mCnc: 186 mg/dL
Hgb A1c MFr Bld: 8.1 % — ABNORMAL HIGH (ref 4.8–5.6)

## 2019-07-23 MED ORDER — PRAVASTATIN SODIUM 40 MG PO TABS: 40.0000 mg | ORAL_TABLET | Freq: Every day | ORAL | 1 refills | Status: DC

## 2019-07-28 ENCOUNTER — Telehealth: Payer: Self-pay | Admitting: Family Medicine

## 2019-07-28 NOTE — Telephone Encounter (Signed)
Pt called in and stated Apolonio Schneiders was suppose to send in a med for her because her A1C was up.  She is  not sure the name of the med.  She would like to check and see what med it is and how much it is going to be out of pocket?  She stated she is going to have to pay for this med out of pocket.   She stated it was ok to leave a message.   Best number [phone]

## 2019-07-29 MED ORDER — PIOGLITAZONE HCL 15 MG PO TABS: 15.0000 mg | ORAL_TABLET | Freq: Every day | ORAL | 0 refills | Status: DC

## 2019-07-29 NOTE — Telephone Encounter (Signed)
Excellent, will send it in now for her and let's see her back in 3 months to recheck as scheduled

## 2019-07-29 NOTE — Telephone Encounter (Signed)
Called and spoke to patient. She states that she is ok with starting on the Actos and would like it sent to [facility].

## 2019-07-29 NOTE — Telephone Encounter (Signed)
In the message I got back on result note it said she wanted to hold off. The medication we discussed was actos, which she would take once daily. If she wants me to send it in I am happy to but she specifically said she didn't want it at this time.

## 2019-08-02 NOTE — Assessment & Plan Note (Signed)
Recheck lipids, adjust as needed. Continue current regimen and lifestyle modifications 

## 2019-08-02 NOTE — Assessment & Plan Note (Signed)
Recheck lipids, adjust as needed. Continue current regimen 

## 2019-08-02 NOTE — Assessment & Plan Note (Signed)
BPs typically stable and WNL, continue current regimen and lifestyle modifications

## 2019-08-23 ENCOUNTER — Encounter: Admit: 2019-08-23 | Payer: PRIVATE HEALTH INSURANCE | Attending: Cardiovascular Disease | Primary: Family Medicine

## 2019-08-24 DIAGNOSIS — R9431 Abnormal electrocardiogram [ECG] [EKG]: Secondary | ICD-10-CM | POA: Insufficient documentation

## 2019-08-24 DIAGNOSIS — R002 Palpitations: Secondary | ICD-10-CM | POA: Insufficient documentation

## 2019-08-24 DIAGNOSIS — E785 Hyperlipidemia, unspecified: Secondary | ICD-10-CM | POA: Insufficient documentation

## 2019-08-24 MED ORDER — KLOR-CON 20 MEQ ORAL PACKET
20 mEq | 1 refills | Status: AC
Start: 2019-08-24 — End: 2019-10-05

## 2019-08-24 NOTE — Telephone Encounter
Refilled KCL.  X one, no refillsShe has not keep follow up appointments with me, and cancelled both echo and stress test.  Needs follow up in office

## 2019-09-07 ENCOUNTER — Ambulatory Visit: Admit: 2019-09-07 | Payer: PRIVATE HEALTH INSURANCE | Attending: Adult Health | Primary: Family Medicine

## 2019-09-09 ENCOUNTER — Encounter: Payer: Self-pay | Admitting: Family Medicine

## 2019-09-09 ENCOUNTER — Other Ambulatory Visit: Payer: Self-pay

## 2019-09-09 ENCOUNTER — Ambulatory Visit (INDEPENDENT_AMBULATORY_CARE_PROVIDER_SITE_OTHER): Payer: 59 | Admitting: Family Medicine

## 2019-09-09 VITALS — BP 159/93 | HR 82 | Temp 99.0°F | Wt 186.0 lb

## 2019-09-09 DIAGNOSIS — R21 Rash and other nonspecific skin eruption: Secondary | ICD-10-CM

## 2019-09-09 DIAGNOSIS — Z1211 Encounter for screening for malignant neoplasm of colon: Secondary | ICD-10-CM

## 2019-09-09 DIAGNOSIS — M542 Cervicalgia: Secondary | ICD-10-CM | POA: Diagnosis not present

## 2019-09-09 DIAGNOSIS — M25511 Pain in right shoulder: Secondary | ICD-10-CM

## 2019-09-09 DIAGNOSIS — Z1231 Encounter for screening mammogram for malignant neoplasm of breast: Secondary | ICD-10-CM

## 2019-09-09 DIAGNOSIS — G8929 Other chronic pain: Secondary | ICD-10-CM

## 2019-09-09 MED ORDER — KETOCONAZOLE 2 % EX CREA: 1.0000 "application " | TOPICAL_CREAM | Freq: Every day | CUTANEOUS | 0 refills | Status: DC

## 2019-09-09 NOTE — Progress Notes (Signed)
BP (!) 159/93   Pulse 82   Temp 99 F (37.2 C) (Oral)   Wt 186 lb (84.4 kg)   SpO2 99%   BMI 31.83 kg/m    Subjective:    Patient ID: Lacey Schaefer, female    DOB: September 12, 1960, 59 y.o.   MRN: [id number]  HPI: Lacey Schaefer is a 59 y.o. female  Chief Complaint  Patient presents with  . Rash    bilateral legs  . Neck Pain    left side over a month  . Referral    OBGYN   Darkened patchy rash still present b/l lower legs about halfway up calf. Tried holding amlodipine which did not seem to help. Still does not itch, hurt, or seem to change much in appearance. Would like Dermatology referral. Not currently using anything OTC on areas.   Also dealing with ongoing right neck pain, stiffness, and popping which has been ongoing for over a month. Sometimes also having pain in the right shoulder in a similar manner. No injury noted to either. Not currently trying anything OTC for sxs. Denies radiation of pain down right arm, numbness, tingling, swelling, discoloration.     Relevant past medical, surgical, family and social history reviewed and updated as indicated. Interim medical history since our last visit reviewed. Allergies and medications reviewed and updated.  Review of Systems  Per HPI unless specifically indicated above     Objective:    BP (!) 159/93   Pulse 82   Temp 99 F (37.2 C) (Oral)   Wt 186 lb (84.4 kg)   SpO2 99%   BMI 31.83 kg/m   Wt Readings from Last 3 Encounters:  09/09/19 186 lb (84.4 kg)  07/22/19 192 lb (87.1 kg)  08/21/18 194 lb (88 kg)    Physical Exam Vitals and nursing note reviewed.  Constitutional:      Appearance: Normal appearance. She is not ill-appearing.  HENT:     Head: Atraumatic.  Eyes:     Extraocular Movements: Extraocular movements intact.     Conjunctiva/sclera: Conjunctivae normal.  Cardiovascular:     Rate and Rhythm: Normal rate and regular rhythm.     Heart sounds: Normal heart sounds.  Pulmonary:     Effort:  Pulmonary effort is normal.     Breath sounds: Normal breath sounds.  Musculoskeletal:        General: No swelling or tenderness. Normal range of motion.     Cervical back: Normal range of motion and neck supple.     Comments: Mild right trapezius ttp and spasm  Skin:    General: Skin is warm and dry.     Findings: Rash (patchy macular hyperpigmentation b/l lower legs. nontender) present.  Neurological:     Mental Status: She is alert and oriented to person, place, and time.     Sensory: No sensory deficit.     Motor: No weakness.     Gait: Gait normal.  Psychiatric:        Mood and Affect: Mood normal.        Thought Content: Thought content normal.        Judgment: Judgment normal.     Results for orders placed or performed in visit on 07/22/19  HM HIV SCREENING LAB  Result Value Ref Range   HM HIV Screening Negative - Patient reported   Comprehensive metabolic panel  Result Value Ref Range   Glucose 197 (H) 65 - 99 mg/dL   BUN 15 8 -  27 mg/dL   Creatinine, Ser 0.76 0.57 - 1.00 mg/dL   GFR calc non Af Amer 86 >59 mL/min/1.73   GFR calc Af Amer 99 >59 mL/min/1.73   BUN/Creatinine Ratio 20 12 - 28   Sodium 140 134 - 144 mmol/L   Potassium 3.8 3.5 - 5.2 mmol/L   Chloride 99 96 - 106 mmol/L   CO2 26 20 - 29 mmol/L   Calcium 9.9 8.7 - 10.3 mg/dL   Total Protein 7.2 6.0 - 8.5 g/dL   Albumin 4.6 3.8 - 4.9 g/dL   Globulin, Total 2.6 1.5 - 4.5 g/dL   Albumin/Globulin Ratio 1.8 1.2 - 2.2   Bilirubin Total 0.3 0.0 - 1.2 mg/dL   Alkaline Phosphatase 72 39 - 117 IU/L   AST 14 0 - 40 IU/L   ALT 19 0 - 32 IU/L  HgB A1c  Result Value Ref Range   Hgb A1c MFr Bld 8.1 (H) 4.8 - 5.6 %   Est. average glucose Bld gHb Est-mCnc 186 mg/dL  Lipid Panel w/o Chol/HDL Ratio  Result Value Ref Range   Cholesterol, Total 220 (H) 100 - 199 mg/dL   Triglycerides 153 (H) 0 - 149 mg/dL   HDL 59 >39 mg/dL   VLDL Cholesterol Cal 27 5 - 40 mg/dL   LDL Chol Calc (NIH) 134 (H) 0 - 99 mg/dL        Assessment & Plan:   Problem List Items Addressed This Visit    None    Visit Diagnoses    Rash    -  Primary   Ketoconazole cream given in case tinea versicolor. Dermatology referral placed for further evaluation   Relevant Orders   Ambulatory referral to Dermatology   Chronic right shoulder pain       X-ray ordered, suspect arthritic in nature. Discussed NSAIDs, tylenol, exercise   Relevant Medications   aspirin 81 MG EC tablet   Other Relevant Orders   DG Shoulder Right   Chronic neck pain       Suspect arthritic, obtain x-ray. NSAIDs, maintaining good core strength and posture, healthy body weight. F/u if worsening   Relevant Medications   aspirin 81 MG EC tablet   Other Relevant Orders   DG Cervical Spine Complete   Colon cancer screening       Relevant Orders   Ambulatory referral to Gastroenterology   Encounter for screening mammogram for malignant neoplasm of breast       Relevant Orders   MM DIGITAL SCREENING BILATERAL       Follow up plan: Return for as scheduled.

## 2019-09-09 NOTE — Patient Instructions (Addendum)
Please call at this number [facility]) to schedule your mammogram. [phone]  Voltaren gel - get that at any drug store  [facility] for the x-ray - you do not need an appointment you can walk in M - T 8-5

## 2019-09-20 ENCOUNTER — Encounter: Payer: Self-pay | Admitting: Emergency Medicine

## 2019-09-20 ENCOUNTER — Emergency Department
Admission: EM | Admit: 2019-09-20 | Discharge: 2019-09-20 | Disposition: A | Discharge status: Home / Self Care | Payer: 59 | Attending: Emergency Medicine | Admitting: Emergency Medicine

## 2019-09-20 ENCOUNTER — Emergency Department: Payer: 59

## 2019-09-20 ENCOUNTER — Other Ambulatory Visit: Payer: Self-pay

## 2019-09-20 DIAGNOSIS — Z87891 Personal history of nicotine dependence: Secondary | ICD-10-CM | POA: Diagnosis not present

## 2019-09-20 DIAGNOSIS — Z79899 Other long term (current) drug therapy: Secondary | ICD-10-CM | POA: Insufficient documentation

## 2019-09-20 DIAGNOSIS — E119 Type 2 diabetes mellitus without complications: Secondary | ICD-10-CM | POA: Insufficient documentation

## 2019-09-20 DIAGNOSIS — Z794 Long term (current) use of insulin: Secondary | ICD-10-CM | POA: Diagnosis not present

## 2019-09-20 DIAGNOSIS — I10 Essential (primary) hypertension: Secondary | ICD-10-CM | POA: Diagnosis not present

## 2019-09-20 DIAGNOSIS — R079 Chest pain, unspecified: Secondary | ICD-10-CM | POA: Diagnosis not present

## 2019-09-20 LAB — BASIC METABOLIC PANEL
Anion gap: 11 (ref 5–15)
BUN: 13 mg/dL (ref 6–20)
CO2: 28 mmol/L (ref 22–32)
Calcium: 9.2 mg/dL (ref 8.9–10.3)
Chloride: 100 mmol/L (ref 98–111)
Creatinine, Ser: 0.76 mg/dL (ref 0.44–1.00)
GFR calc Af Amer: >60 mL/min (ref >60)
GFR calc non Af Amer: >60 mL/min (ref >60)
Glucose, Bld: 183 mg/dL — ABNORMAL HIGH (ref 70–99)
Potassium: 3.1 mmol/L — ABNORMAL LOW (ref 3.5–5.1)
Sodium: 139 mmol/L (ref 135–145)

## 2019-09-20 LAB — URINALYSIS, COMPLETE (UACMP) WITH MICROSCOPIC
Bacteria, UA: NONE SEEN
Bilirubin Urine: NEGATIVE
Glucose, UA: NEGATIVE mg/dL
Hgb urine dipstick: NEGATIVE
Ketones, ur: NEGATIVE mg/dL
Leukocytes,Ua: NEGATIVE
Nitrite: NEGATIVE
Protein, ur: NEGATIVE mg/dL
Specific Gravity, Urine: 1.009 (ref 1.005–1.030)
WBC, UA: NONE SEEN WBC/hpf (ref 0–5)
pH: 6 (ref 5.0–8.0)

## 2019-09-20 LAB — HEPATIC FUNCTION PANEL
ALT: 18 U/L (ref 0–44)
AST: 12 U/L — ABNORMAL LOW (ref 15–41)
Albumin: 3.8 g/dL (ref 3.5–5.0)
Alkaline Phosphatase: 60 U/L (ref 38–126)
Bilirubin, Direct: <0.1 mg/dL (ref 0.0–0.2)
Total Bilirubin: 0.6 mg/dL (ref 0.3–1.2)
Total Protein: 7.4 g/dL (ref 6.5–8.1)

## 2019-09-20 LAB — CBC
HCT: 37.6 % (ref 36.0–46.0)
Hemoglobin: 12.2 g/dL (ref 12.0–15.0)
MCH: 26.9 pg (ref 26.0–34.0)
MCHC: 32.4 g/dL (ref 30.0–36.0)
MCV: 82.8 fL (ref 80.0–100.0)
Platelets: 318 10*3/uL (ref 150–400)
RBC: 4.54 MIL/uL (ref 3.87–5.11)
RDW: 14.5 % (ref 11.5–15.5)
WBC: 6.1 10*3/uL (ref 4.0–10.5)
nRBC: 0 % (ref 0.0–0.2)

## 2019-09-20 LAB — LIPASE, BLOOD: Lipase: 38 U/L (ref 11–51)

## 2019-09-20 LAB — TROPONIN I (HIGH SENSITIVITY)
Troponin I (High Sensitivity): 7 ng/L (ref <18)
Troponin I (High Sensitivity): 7 ng/L (ref <18)

## 2019-09-20 MED ORDER — POTASSIUM CHLORIDE CRYS ER 20 MEQ PO TBCR
40.0000 meq | EXTENDED_RELEASE_TABLET | Freq: Once | ORAL | Status: AC
  Administered 2019-09-20: 40 meq via ORAL
  Filled 2019-09-20: qty 2

## 2019-09-20 MED ORDER — SODIUM CHLORIDE 0.9% FLUSH: 3.0000 mL | Freq: Once | INTRAVENOUS | Status: DC

## 2019-09-20 MED ORDER — ACETAMINOPHEN 325 MG PO TABS
650.0000 mg | ORAL_TABLET | Freq: Once | ORAL | Status: AC
  Administered 2019-09-20: 650 mg via ORAL
  Filled 2019-09-20: qty 2

## 2019-09-20 NOTE — ED Provider Notes (Signed)
[facility] Emergency Department Provider Note  ____________________________________________   First MD Initiated Contact with Patient 09/20/19 [phone]     (approximate)  I have reviewed the triage vital signs and the nursing notes.   HISTORY  Chief Complaint Chest Pain    HPI Lacey Schaefer is a 59 y.o. female with diabetes, HTN, migraines who comes in with chest pain.  Patient reports having some pain on the left side of her chest.  Is a sharp stabbing sensation.  It has been there since yesterday, constant, better with some aspirin she took, nothing makes it worse.  She denies any shortness of breath, leg swelling, risk factors for PE.  She states that she has been doing some heavy lifting as he does not notice musculoskeletal in nature.  Otherwise she has felt well.  Patient is seen by Dr. Clayborn Bigness for intermittent chest pain and palpitations.  She states that she is had reassuring work-up thus far with a plan to do a nuclear medicine stress test.  Patient states her pain is very minimal with at this time.          Past Medical History:  Diagnosis Date  . Diabetes mellitus without complication ([place])   . Hypertension   . Migraines     Patient Active Problem List   Diagnosis Date Noted  . Hyperlipidemia associated with type 2 diabetes mellitus ([place]) 07/22/2019  . Diabetes mellitus without complication ([place]) 17/51/0258  . Hypertension associated with diabetes ([place]) 08/22/2018    Past Surgical History:  Procedure Laterality Date  . ABDOMINAL HYSTERECTOMY      Prior to Admission medications   Medication Sig Start Date End Date Taking? Authorizing Provider  albuterol (VENTOLIN HFA) 108 (90 Base) MCG/ACT inhaler Inhale 2 puffs into the lungs every 6 (six) hours as needed for wheezing or shortness of breath. 07/22/19   Volney American, PA-C  amLODipine (NORVASC) 10 MG tablet Take 10 mg by mouth daily.    [provider]  aspirin 81 MG EC  tablet Take by mouth daily.     [provider]  carvedilol (COREG) 3.125 MG tablet Take 3.125 mg by mouth 2 (two) times daily with a meal.    [provider]  hydrALAZINE (APRESOLINE) 50 MG tablet Take 100 mg by mouth 2 (two) times daily. 05/29/18   [provider]  hydrochlorothiazide (HYDRODIURIL) 25 MG tablet Take 1 tablet (25 mg total) by mouth daily. 09/08/18   Volney American, PA-C  ketoconazole (NIZORAL) 2 % cream Apply 1 application topically daily. 09/09/19   Volney American, PA-C  potassium chloride (KLOR-CON) 20 MEQ packet Take 20 mEq by mouth daily.    [provider]  pravastatin (PRAVACHOL) 40 MG tablet Take 1 tablet (40 mg total) by mouth daily. 07/23/19   Volney American, PA-C  sitaGLIPtin-metformin (JANUMET) 50-1000 MG tablet Take 1 tablet by mouth 2 (two) times daily with a meal. 07/22/19   Volney American, PA-C    Allergies Sulfasalazine, Metoprolol, Sulfa antibiotics, Sulfur, and Lisinopril  Family History  Problem Relation Age of Onset  . Diabetes Mother   . Prostate cancer Father   . Heart disease Sister   . Prostate cancer Brother     Social History Social History   Tobacco Use  . Smoking status: Former Research scientist (life sciences)  . Smokeless tobacco: Never Used  Substance Use Topics  . Alcohol use: No  . Drug use: No      Review of  Systems Constitutional: No fever/chills Eyes: No visual changes. ENT: No sore throat. Cardiovascular: Positive chest pain Respiratory: Denies shortness of breath. Gastrointestinal: No abdominal pain.  No nausea, no vomiting.  No diarrhea.  No constipation. Genitourinary: Negative for dysuria. Musculoskeletal: Negative for back pain. Skin: Negative for rash. Neurological: Negative for headaches, focal weakness or numbness. All other ROS negative ____________________________________________   PHYSICAL EXAM:  VITAL SIGNS: ED Triage Vitals  Enc Vitals Group     BP 09/20/19 0950  (!) 144/91     Pulse Rate 09/20/19 0950 80     Resp 09/20/19 0950 18     Temp 09/20/19 0950 99.2 F (37.3 C)     Temp src --      SpO2 09/20/19 0950 98 %     Weight 09/20/19 0948 186 lb (84.4 kg)     Height 09/20/19 0948 5\' 5"  (1.651 m)     Head Circumference --      Peak Flow --      Pain Score 09/20/19 0947 6     Pain Loc --      Pain Edu? --      Excl. in [place]? --     Constitutional: Alert and oriented. Well appearing and in no acute distress. Eyes: Conjunctivae are normal. EOMI. Head: Atraumatic. Nose: No congestion/rhinnorhea. Mouth/Throat: Mucous membranes are moist.   Neck: No stridor. Trachea Midline. FROM Cardiovascular: Normal rate, regular rhythm. Grossly normal heart sounds.  Good peripheral circulation. Respiratory: Normal respiratory effort.  No retractions. Lungs CTAB. Gastrointestinal: Soft and nontender. No distention. No abdominal bruits.  Musculoskeletal: No lower extremity tenderness nor edema.  No joint effusions. Neurologic:  Normal speech and language. No gross focal neurologic deficits are appreciated.  Skin:  Skin is warm, dry and intact. No rash noted. Psychiatric: Mood and affect are normal. Speech and behavior are normal. GU: Deferred   ____________________________________________   LABS (all labs ordered are listed, but only abnormal results are displayed)  Labs Reviewed  BASIC METABOLIC PANEL - Abnormal; Notable for the following components:      Result Value   Potassium 3.1 (*)    Glucose, Bld 183 (*)    All other components within normal limits  HEPATIC FUNCTION PANEL - Abnormal; Notable for the following components:   AST 12 (*)    All other components within normal limits  URINALYSIS, COMPLETE (UACMP) WITH MICROSCOPIC - Abnormal; Notable for the following components:   Color, Urine STRAW (*)    APPearance CLEAR (*)    All other components within normal limits  CBC  LIPASE, BLOOD  TROPONIN I (HIGH SENSITIVITY)  TROPONIN I (HIGH  SENSITIVITY)   ____________________________________________   ED ECG REPORT I, Vanessa Glen Osborne, the attending physician, personally viewed and interpreted this ECG.  Normal sinus rate 83, no ST elevation, no T wave inversions, QTC of 491 ____________________________________________  RADIOLOGY Robert Bellow, personally viewed and evaluated these images (plain radiographs) as part of my medical decision making, as well as reviewing the written report by the radiologist.  ED MD interpretation: No pneumonia  Official radiology report(s): DG Chest 2 View  Result Date: 09/20/2019 CLINICAL DATA:  Left-sided chest pain. EXAM: CHEST - 2 VIEW COMPARISON:  08/25/2014 FINDINGS: Lungs are adequately inflated and otherwise clear. Cardiomediastinal silhouette and remainder of the exam is unchanged. IMPRESSION: No active cardiopulmonary disease. Electronically Signed   By: Marin Olp M.D.   On: 09/20/2019 10:22    ____________________________________________   PROCEDURES  Procedure(s)  performed (including Critical Care):  Procedures   ____________________________________________   INITIAL IMPRESSION / ASSESSMENT AND PLAN / ED COURSE   Lacey Schaefer was evaluated in Emergency Department on 09/20/2019 for the symptoms described in the history of present illness. She was evaluated in the context of the global COVID-19 pandemic, which necessitated consideration that the patient might be at risk for infection with the SARS-CoV-2 virus that causes COVID-19. Institutional protocols and algorithms that pertain to the evaluation of patients at risk for COVID-19 are in a state of rapid change based on information released by regulatory bodies including the CDC and federal and state organizations. These policies and algorithms were followed during the patient's care in the ED.    Most Likely DDx:  -MSK (atypical chest pain) but will get cardiac markers to evaluate for ACS given risk factors/age   DDx  that was also considered d/t potential to cause harm, but was found less likely based on history and physical (as detailed above): -PNA (no fevers, cough but CXR to evaluate) -PNX (reassured with equal b/l breath sounds, CXR to evaluate) -Symptomatic anemia (will get H&H) -Pulmonary embolism as no sob at rest, not pleuritic in nature, no hypoxia -Aortic Dissection as no tearing pain and no radiation to the mid back, pulses equal -Pericarditis no rub on exam, EKG changes or hx to suggest dx -Tamponade (no notable SOB, tachycardic, hypotensive) -Esophageal rupture (no h/o diffuse vomitting/no crepitus)  Cardiac markers are negative x2.  No signs of anemia.  K slightly low at 3.1.  Given some oral repletion.  Chest x-ray no pneumonia, EKG was reassuring.  Discussed with patient continuing Tylenol.  She states that she has resolution of her symptoms since then.  She has her cardiologist Dr. Clayborn Bigness that she can follow-up with and she can return to the ER if she develops worsening chest pain or any other concerns.  Patient expressed understanding felt comfortable with this plan      ____________________________________________   FINAL CLINICAL IMPRESSION(S) / ED DIAGNOSES   Final diagnoses:  Chest pain, unspecified type     MEDICATIONS GIVEN DURING THIS VISIT:  Medications  sodium chloride flush (NS) 0.9 % injection 3 mL (3 mLs Intravenous Not Given 09/20/19 1041)  acetaminophen (TYLENOL) tablet 650 mg (650 mg Oral Given 09/20/19 1144)  potassium chloride SA (KLOR-CON) CR tablet 40 mEq (40 mEq Oral Given 09/20/19 1144)     ED Discharge Orders    None       Note:  This document was prepared using Dragon voice recognition software and may include unintentional dictation errors.   Vanessa Badin, MD 09/20/19 1236

## 2019-09-20 NOTE — Discharge Instructions (Addendum)
Take the Tylenol 1 g every 8 hours to help with your pain.  Follow-up with cardiology for your chest pain.  Return the ER for worsening chest pain, shortness of breath any other concerns

## 2019-09-20 NOTE — ED Notes (Signed)
Pt in X ray

## 2019-09-20 NOTE — ED Triage Notes (Signed)
Pt to ER with c/o left sided chest pain that started yesterday and go worse last night.  Pt states pain continues today, but is less than last night.  Pt denies n/v, SHOB.

## 2019-09-25 ENCOUNTER — Telehealth: Payer: 59

## 2019-10-01 ENCOUNTER — Telehealth (INDEPENDENT_AMBULATORY_CARE_PROVIDER_SITE_OTHER): Payer: Self-pay | Admitting: Gastroenterology

## 2019-10-01 ENCOUNTER — Other Ambulatory Visit: Payer: Self-pay

## 2019-10-01 DIAGNOSIS — Z1211 Encounter for screening for malignant neoplasm of colon: Secondary | ICD-10-CM

## 2019-10-01 MED ORDER — NA SULFATE-K SULFATE-MG SULF 17.5-3.13-1.6 GM/177ML PO SOLN: 1.0000 | Freq: Once | ORAL | 0 refills | Status: AC

## 2019-10-01 NOTE — Progress Notes (Signed)
Gastroenterology Pre-Procedure Review  Request Date: 11/27/19 Requesting Physician: Dr. Bonna Gains  PATIENT REVIEW QUESTIONS: The patient responded to the following health history questions as indicated:    1. Are you having any GI issues? no 2. Do you have a personal history of Polyps? no 3. Do you have a family history of Colon Cancer or Polyps? no 4. Diabetes Mellitus? yes (type 2) 5. Joint replacements in the past 12 months?no 6. Major health problems in the past 3 months?yes (chest pain ER Visit 09/20/19 cardiac clearance to be sent to Dr. Marianna Payment.  Pt states she has experienced palpitations but testing was normal.) 7. Any artificial heart valves, MVP, or defibrillator?no    MEDICATIONS & ALLERGIES:    Patient reports the following regarding taking any anticoagulation/antiplatelet therapy:   Plavix, Coumadin, Eliquis, Xarelto, Lovenox, Pradaxa, Brilinta, or Effient? no Aspirin? yes (aspirin 81 mg)  Patient confirms/reports the following medications:  Current Outpatient Medications  Medication Sig Dispense Refill  . albuterol (VENTOLIN HFA) 108 (90 Base) MCG/ACT inhaler Inhale 2 puffs into the lungs every 6 (six) hours as needed for wheezing or shortness of breath. 18 g 1  . amLODipine (NORVASC) 10 MG tablet Take 10 mg by mouth daily.    Marland Kitchen aspirin 81 MG EC tablet Take by mouth daily.     . carvedilol (COREG) 3.125 MG tablet Take 3.125 mg by mouth 2 (two) times daily with a meal.    . hydrALAZINE (APRESOLINE) 50 MG tablet Take 100 mg by mouth 2 (two) times daily.    . hydrochlorothiazide (HYDRODIURIL) 25 MG tablet Take 1 tablet (25 mg total) by mouth daily. 90 tablet 1  . potassium chloride (KLOR-CON) 20 MEQ packet Take 20 mEq by mouth daily.    . pravastatin (PRAVACHOL) 40 MG tablet Take 1 tablet (40 mg total) by mouth daily. 90 tablet 1  . sitaGLIPtin-metformin (JANUMET) 50-1000 MG tablet Take 1 tablet by mouth 2 (two) times daily with a meal. 60 tablet 5  . Na Sulfate-K  Sulfate-Mg Sulf 17.5-3.13-1.6 GM/177ML SOLN Take 1 kit by mouth once for 1 dose. 354 mL 0   No current facility-administered medications for this visit.    Patient confirms/reports the following allergies:  Allergies  Allergen Reactions  . Sulfasalazine Hives  . Metoprolol Other (See Comments)  . Sulfa Antibiotics Hives  . Sulfur Other (See Comments)  . Lisinopril Cough    Orders Placed This Encounter  Procedures  . Procedural/ Surgical Case Request: COLONOSCOPY WITH PROPOFOL    Standing Status:   Standing    Number of Occurrences:   1    Order Specific Question:   Pre-op diagnosis    Answer:   screening colonoscopy    Order Specific Question:   CPT Code    Answer:   82993    AUTHORIZATION INFORMATION Primary Insurance: 1D#: Group #:  Secondary Insurance: 1D#: Group #:  SCHEDULE INFORMATION: Date: 11/27/19 Time: Location:[facility]

## 2019-10-04 ENCOUNTER — Encounter: Admit: 2019-10-04 | Payer: PRIVATE HEALTH INSURANCE | Attending: Adult Health | Primary: Family Medicine

## 2019-10-05 MED ORDER — KLOR-CON 20 MEQ ORAL PACKET
20 mEq | 1 refills | Status: AC
Start: 2019-10-05 — End: 2020-08-01

## 2019-10-05 NOTE — Telephone Encounter
Refilled KCL X one only.  She has not kept any follow up appointments with me, cancelled her stress test and echo, and per [facility], she has an appointment with [facility] in [place] on 10/29/2019

## 2019-10-21 ENCOUNTER — Ambulatory Visit: Payer: Self-pay | Admitting: Family Medicine

## 2019-11-03 ENCOUNTER — Ambulatory Visit (INDEPENDENT_AMBULATORY_CARE_PROVIDER_SITE_OTHER): Payer: 59 | Admitting: Dermatology

## 2019-11-03 ENCOUNTER — Other Ambulatory Visit: Payer: Self-pay

## 2019-11-03 DIAGNOSIS — D239 Other benign neoplasm of skin, unspecified: Secondary | ICD-10-CM

## 2019-11-03 DIAGNOSIS — Y93G3 Activity, cooking and baking: Secondary | ICD-10-CM

## 2019-11-03 DIAGNOSIS — T2020XA Burn of second degree of head, face, and neck, unspecified site, initial encounter: Secondary | ICD-10-CM | POA: Diagnosis not present

## 2019-11-03 DIAGNOSIS — T23271A Burn of second degree of right wrist, initial encounter: Secondary | ICD-10-CM

## 2019-11-03 DIAGNOSIS — X102XXA Contact with fats and cooking oils, initial encounter: Secondary | ICD-10-CM | POA: Diagnosis not present

## 2019-11-03 DIAGNOSIS — I872 Venous insufficiency (chronic) (peripheral): Secondary | ICD-10-CM

## 2019-11-03 DIAGNOSIS — E119 Type 2 diabetes mellitus without complications: Secondary | ICD-10-CM

## 2019-11-03 DIAGNOSIS — L853 Xerosis cutis: Secondary | ICD-10-CM

## 2019-11-03 DIAGNOSIS — D2372 Other benign neoplasm of skin of left lower limb, including hip: Secondary | ICD-10-CM

## 2019-11-03 DIAGNOSIS — D2371 Other benign neoplasm of skin of right lower limb, including hip: Secondary | ICD-10-CM

## 2019-11-03 DIAGNOSIS — D2361 Other benign neoplasm of skin of right upper limb, including shoulder: Secondary | ICD-10-CM

## 2019-11-03 MED ORDER — MOMETASONE FUROATE 0.1 % EX CREA: TOPICAL_CREAM | CUTANEOUS | 1 refills | Status: DC

## 2019-11-03 MED ORDER — MUPIROCIN 2 % EX OINT: TOPICAL_OINTMENT | CUTANEOUS | 1 refills | Status: DC

## 2019-11-03 NOTE — Progress Notes (Signed)
   New Patient Visit  Subjective  Lacey Schaefer is a 59 y.o. female who presents for the following: Rash (lower legs x 6+ months. Tried ketoconazole 2% cream as prescribed by another doctor. Had to d/c due to burning.). Had an oil burn to face when frying fish a couple days ago.  It has blistered up.  Pt is diabetic.  She also has an itchy back.   The following portions of the chart were reviewed this encounter and updated as appropriate:      Review of Systems:  No other skin or systemic complaints except as noted in HPI or Assessment and Plan.  Objective  Well appearing patient in no apparent distress; mood and affect are within normal limits.  A focused examination was performed including face, neck, chest and back. Relevant physical exam findings are noted in the Assessment and Plan.  Objective  Bilateral Lower Legs: Trace pitting edema of lower legs at ankles, yellow/brown hyperpigmented macules/patches c/w stasis changes.  Objective  Left and Right pretibia, R wrist: Firm brown papulenodule with dimple sign.   Objective  Left Perioral at chin, L lat cheek, R wrist: Crusted eroded patch on left perioral at chin, smaller crusted erosions on right wrist and left lateral cheek.   Assessment & Plan  Stasis dermatitis of both legs Bilateral Lower Legs  Discuseed chronic condition.  Recommend compression socks daily. Advised patient some discoloration on lower legs may not fade.  Start mometasone cream Apply to affected areas legs QD/BID x 2-4 weeks  Topical steroids (such as triamcinolone, fluocinolone, fluocinonide, mometasone, clobetasol, halobetasol, betamethasone, hydrocortisone) can cause thinning and lightening of the skin if they are used for too long in the same area. Your physician has selected the right strength medicine for your problem and area affected on the body. Please use your medication only as directed by your physician to prevent side effects.     mometasone (ELOCON) 0.1 % cream - Bilateral Lower Legs  Partial thickness burn of face and head, initial encounter  Dermatofibroma Left and Right pretibia, R wrist  Benign, observe.    Second degree burn of face, initial encounter Left Perioral at chin, L lat cheek, R wrist  Start mupirocin 2% ointment Apply to affected areas twice daily and cover until healed.  Discussed photoprotection. Discoloration will fade over time.  mupirocin ointment (BACTROBAN) 2 % - Left Perioral at chin, L lat cheek, R wrist   Xerosis - diffuse xerotic patches back, legs - recommend gentle, hydrating skin care, may use mometasone cream qd/bid prn itch - gentle skin care handout given   Return in about 2 months (around 01/03/2020) for Stasis Derm.   IJamesetta Orleans, CMA, am acting as scribe for Brendolyn Patty, MD .  Documentation: I have reviewed the above documentation for accuracy and completeness, and I agree with the above.  Brendolyn Patty MD

## 2019-11-03 NOTE — Patient Instructions (Addendum)
Mometasone Cream - Apply to lower legs 1-2 times daily for 2-4 weeks and as needed for flares.  May also use 1-2 times daily to itchy areas on back as needed.   Topical steroids (such as triamcinolone, fluocinolone, fluocinonide, mometasone, clobetasol, halobetasol, betamethasone, hydrocortisone) can cause thinning and lightening of the skin if they are used for too long in the same area. Your physician has selected the right strength medicine for your problem and area affected on the body. Please use your medication only as directed by your physician to prevent side effects.   Recommend compression socks daily.  Gentle Skin Care Guide  1. Bathe no more than once a day.  2. Avoid bathing in hot water  3. Use a mild soap like Dove, Vanicream, Cetaphil, CeraVe. Can use Lever 2000 or Cetaphil antibacterial soap  4. Use soap only where you need it. On most days, use it under your arms, between your legs, and on your feet. Let the water rinse other areas unless visibly dirty.  5. When you get out of the bath/shower, use a towel to gently blot your skin dry, don't rub it.  6. While your skin is still a little damp, apply a moisturizing cream such as Vanicream, CeraVe, Cetaphil, Eucerin, Sarna lotion or plain Vaseline Jelly. For hands apply Neutrogena [place] Hand Cream or Excipial Hand Cream.  7. Reapply moisturizer any time you start to itch or feel dry.  8. Sometimes using free and clear laundry detergents can be helpful. Fabric softener sheets should be avoided. Downy Free & Gentle liquid, or any liquid fabric softener that is free of dyes and perfumes, it acceptable to use  9. If your doctor has given you prescription creams you may apply moisturizers over them

## 2019-11-25 ENCOUNTER — Other Ambulatory Visit: Admission: RE | Admit: 2019-11-25 | Payer: 59 | Source: Ambulatory Visit

## 2019-11-27 ENCOUNTER — Ambulatory Visit: Admission: RE | Admit: 2019-11-27 | Payer: 59 | Source: Home / Self Care | Admitting: Gastroenterology

## 2019-11-27 ENCOUNTER — Encounter: Admission: RE | Payer: Self-pay | Source: Home / Self Care

## 2019-11-27 SURGERY — COLONOSCOPY WITH PROPOFOL
Anesthesia: General

## 2019-12-27 ENCOUNTER — Encounter: Payer: Self-pay | Admitting: Nurse Practitioner

## 2020-01-01 ENCOUNTER — Ambulatory Visit: Payer: Self-pay | Admitting: Nurse Practitioner

## 2020-01-04 ENCOUNTER — Ambulatory Visit: Payer: 59 | Admitting: Dermatology

## 2020-01-13 ENCOUNTER — Other Ambulatory Visit: Payer: Self-pay | Admitting: Family Medicine

## 2020-01-18 ENCOUNTER — Encounter: Payer: Self-pay | Admitting: Family Medicine

## 2020-01-18 ENCOUNTER — Other Ambulatory Visit: Payer: Self-pay

## 2020-01-18 ENCOUNTER — Ambulatory Visit (INDEPENDENT_AMBULATORY_CARE_PROVIDER_SITE_OTHER): Payer: 59 | Admitting: Family Medicine

## 2020-01-18 VITALS — BP 146/78 | HR 86 | Temp 98.9°F | Wt 184.8 lb

## 2020-01-18 DIAGNOSIS — M25511 Pain in right shoulder: Secondary | ICD-10-CM

## 2020-01-18 DIAGNOSIS — E1159 Type 2 diabetes mellitus with other circulatory complications: Secondary | ICD-10-CM | POA: Diagnosis not present

## 2020-01-18 DIAGNOSIS — G8929 Other chronic pain: Secondary | ICD-10-CM

## 2020-01-18 DIAGNOSIS — I152 Hypertension secondary to endocrine disorders: Secondary | ICD-10-CM

## 2020-01-18 DIAGNOSIS — E1169 Type 2 diabetes mellitus with other specified complication: Secondary | ICD-10-CM

## 2020-01-18 DIAGNOSIS — E785 Hyperlipidemia, unspecified: Secondary | ICD-10-CM

## 2020-01-18 DIAGNOSIS — E1165 Type 2 diabetes mellitus with hyperglycemia: Secondary | ICD-10-CM | POA: Diagnosis not present

## 2020-01-18 DIAGNOSIS — Z1211 Encounter for screening for malignant neoplasm of colon: Secondary | ICD-10-CM

## 2020-01-18 MED ORDER — SITAGLIPTIN PHOS-METFORMIN HCL 50-1000 MG PO TABS: 1.0000 | ORAL_TABLET | Freq: Two times a day (BID) | ORAL | 1 refills | Status: DC

## 2020-01-18 MED ORDER — PRAVASTATIN SODIUM 40 MG PO TABS: 40.0000 mg | ORAL_TABLET | Freq: Every day | ORAL | 1 refills | Status: DC

## 2020-01-18 MED ORDER — HYDROCHLOROTHIAZIDE 25 MG PO TABS: 25.0000 mg | ORAL_TABLET | Freq: Every day | ORAL | 1 refills | Status: DC

## 2020-01-18 NOTE — Progress Notes (Signed)
BP (!) 146/78 (BP Location: Left Arm, Cuff Size: Normal)   Pulse 86   Temp 98.9 F (37.2 C) (Oral)   Wt 184 lb 12.8 oz (83.8 kg)   SpO2 99%   BMI 30.75 kg/m    Subjective:    Patient ID: Lacey Schaefer, female    DOB: [birth year], 59 y.o.   MRN: [id number]  HPI: Lacey Schaefer is a 59 y.o. female  Chief Complaint  Patient presents with  . Diabetes    pt states she had a eye exam scheduled but had to cancel due to a death in the family   . Hyperlipidemia  . Hypertension   Had a fall at work about a month ago. Saw the walk in. Did not have any x-rays. She was having some issues with her neck and has some known arthritis. Her neck acts up with the change in the weather.   SHOULDER PAIN Duration: about 4-5 months Involved shoulder: right Mechanism of injury: unknown Location: superior Onset: unknown Severity: moderate  Quality:  aching Frequency: intermittent Radiation: into her neck Aggravating factors: movement  Alleviating factors: heat  Status: worse Treatments attempted: tylenol tiger balm, rest, ice and heat  Relief with NSAIDs?:  No NSAIDs Taken Weakness: no Numbness: no Decreased grip strength: no Redness: no Swelling: no Bruising: no Fevers: no  DIABETES Hypoglycemic episodes:no Polydipsia/polyuria: no Visual disturbance: no Chest pain: no Paresthesias: no Glucose Monitoring: yes  Accucheck frequency: 3x a week, 124, 161, 151 Taking Insulin?: no Blood Pressure Monitoring: a few times a week Retinal Examination: Not up to Date Foot Exam: Done today Diabetic Education: Completed Pneumovax: Up to Date Influenza: Declined Aspirin: yes  HYPERTENSION / HYPERLIPIDEMIA Satisfied with current treatment? yes Duration of hypertension: chronic BP monitoring frequency: a few times a week BP range: 120s-130s/70s BP medication side effects: no Past BP meds: HCTZ, hydralazine, carvedilol, amlodipine Duration of hyperlipidemia: chronic Cholesterol  medication side effects: no Cholesterol supplements: none Past cholesterol medications: pravastatin Medication compliance: excellent compliance Aspirin: yes Recent stressors: no Recurrent headaches: no Visual changes: no Palpitations: no Dyspnea: no Chest pain: no Lower extremity edema: no Dizzy/lightheaded: no  Relevant past medical, surgical, family and social history reviewed and updated as indicated. Interim medical history since our last visit reviewed. Allergies and medications reviewed and updated.  Review of Systems  Per HPI unless specifically indicated above     Objective:    BP (!) 146/78 (BP Location: Left Arm, Cuff Size: Normal)   Pulse 86   Temp 98.9 F (37.2 C) (Oral)   Wt 184 lb 12.8 oz (83.8 kg)   SpO2 99%   BMI 30.75 kg/m   Wt Readings from Last 3 Encounters:  01/18/20 184 lb 12.8 oz (83.8 kg)  09/20/19 186 lb (84.4 kg)  09/09/19 186 lb (84.4 kg)    Physical Exam Vitals and nursing note reviewed.  Constitutional:      General: She is not in acute distress.    Appearance: Normal appearance. She is not ill-appearing, toxic-appearing or diaphoretic.  HENT:     Head: Normocephalic and atraumatic.     Right Ear: External ear normal.     Left Ear: External ear normal.     Nose: Nose normal.     Mouth/Throat:     Mouth: Mucous membranes are moist.     Pharynx: Oropharynx is clear.  Eyes:     General: No scleral icterus.       Right eye: No discharge.  Left eye: No discharge.     Extraocular Movements: Extraocular movements intact.     Conjunctiva/sclera: Conjunctivae normal.     Pupils: Pupils are equal, round, and reactive to light.  Cardiovascular:     Rate and Rhythm: Normal rate and regular rhythm.     Pulses: Normal pulses.     Heart sounds: Normal heart sounds. No murmur heard.  No friction rub. No gallop.   Pulmonary:     Effort: Pulmonary effort is normal. No respiratory distress.     Breath sounds: Normal breath sounds. No  stridor. No wheezing, rhonchi or rales.  Chest:     Chest wall: No tenderness.  Musculoskeletal:        General: Normal range of motion.     Cervical back: Normal range of motion and neck supple.  Skin:    General: Skin is warm and dry.     Capillary Refill: Capillary refill takes less than 2 seconds.     Coloration: Skin is not jaundiced or pale.     Findings: No bruising, erythema, lesion or rash.  Neurological:     General: No focal deficit present.     Mental Status: She is alert and oriented to person, place, and time. Mental status is at baseline.  Psychiatric:        Mood and Affect: Mood normal.        Behavior: Behavior normal.        Thought Content: Thought content normal.        Judgment: Judgment normal.     Results for orders placed or performed in visit on 01/18/20  Microscopic Examination   Urine  Result Value Ref Range   WBC, UA 0-5 0 - 5 /hpf   RBC 0-2 0 - 2 /hpf   Epithelial Cells (non renal) 0-10 0 - 10 /hpf   Mucus, UA Present Not Estab.   Bacteria, UA Few (A) None seen/Few  Bayer DCA Hb A1c Waived  Result Value Ref Range   HB A1C (BAYER DCA - WAIVED) 7.1 (H) <7.0 %  CBC with Differential/Platelet  Result Value Ref Range   WBC 6.8 3.4 - 10.8 x10E3/uL   RBC 4.58 3.77 - 5.28 x10E6/uL   Hemoglobin 12.3 11.1 - 15.9 g/dL   Hematocrit 38.5 34.0 - 46.6 %   MCV 84 79 - 97 fL   MCH 26.9 26.6 - 33.0 pg   MCHC 31.9 31 - 35 g/dL   RDW 13.9 11.7 - 15.4 %   Platelets 368 150 - 450 x10E3/uL   Neutrophils 60 Not Estab. %   Lymphs 31 Not Estab. %   Monocytes 7 Not Estab. %   Eos 1 Not Estab. %   Basos 1 Not Estab. %   Neutrophils Absolute 4.0 1.40 - 7.00 x10E3/uL   Lymphocytes Absolute 2.1 0 - 3 x10E3/uL   Monocytes Absolute 0.5 0 - 0 x10E3/uL   EOS (ABSOLUTE) 0.1 0.0 - 0.4 x10E3/uL   Basophils Absolute 0.1 0 - 0 x10E3/uL   Immature Granulocytes 0 Not Estab. %   Immature Grans (Abs) 0.0 0.0 - 0.1 x10E3/uL  Comprehensive metabolic panel  Result Value Ref  Range   Glucose 107 (H) 65 - 99 mg/dL   BUN 16 8 - 27 mg/dL   Creatinine, Ser 0.81 0.57 - 1.00 mg/dL   GFR calc non Af Amer 79 >59 mL/min/1.73   GFR calc Af Amer 91 >59 mL/min/1.73   BUN/Creatinine Ratio 20 12 - 28  Sodium 142 134 - 144 mmol/L   Potassium 3.3 (L) 3.5 - 5.2 mmol/L   Chloride 102 96 - 106 mmol/L   CO2 27 20 - 29 mmol/L   Calcium 9.8 8.7 - 10.3 mg/dL   Total Protein 6.8 6.0 - 8.5 g/dL   Albumin 4.3 3.8 - 4.9 g/dL   Globulin, Total 2.5 1.5 - 4.5 g/dL   Albumin/Globulin Ratio 1.7 1.2 - 2.2   Bilirubin Total 0.3 0.0 - 1.2 mg/dL   Alkaline Phosphatase 65 44 - 121 IU/L   AST 9 0 - 40 IU/L   ALT 13 0 - 32 IU/L  Lipid Panel w/o Chol/HDL Ratio  Result Value Ref Range   Cholesterol, Total 198 100 - 199 mg/dL   Triglycerides 124 0 - 149 mg/dL   HDL 58 >39 mg/dL   VLDL Cholesterol Cal 22 5 - 40 mg/dL   LDL Chol Calc (NIH) 118 (H) 0 - 99 mg/dL  Microalbumin, Urine Waived  Result Value Ref Range   Microalb, Ur Waived 80 (H) 0 - 19 mg/L   Creatinine, Urine Waived 200 10 - 300 mg/dL   Microalb/Creat Ratio 30-300 (H) <30 mg/g  Urinalysis, Routine w reflex microscopic  Result Value Ref Range   Specific Gravity, UA 1.020 1.005 - 1.030   pH, UA 6.0 5.0 - 7.5   Color, UA Yellow Yellow   Appearance Ur Clear Clear   Leukocytes,UA Negative Negative   Protein,UA 1+ (A) Negative/Trace   Glucose, UA Negative Negative   Ketones, UA Trace (A) Negative   RBC, UA Negative Negative   Bilirubin, UA Negative Negative   Urobilinogen, Ur 0.2 0.2 - 1.0 mg/dL   Nitrite, UA Negative Negative   Microscopic Examination See below:       Assessment & Plan:   Problem List Items Addressed This Visit      Cardiovascular and Mediastinum   Hypertension associated with diabetes ([place]) - Primary    Under good control on current regimen. Continue current regimen. Continue to monitor. Call with any concerns. Refills given. Labs drawn today.         Relevant Medications   pravastatin  (PRAVACHOL) 40 MG tablet   sitaGLIPtin-metformin (JANUMET) 50-1000 MG tablet   hydrochlorothiazide (HYDRODIURIL) 25 MG tablet   Other Relevant Orders   CBC with Differential/Platelet (Completed)   Comprehensive metabolic panel (Completed)   Microalbumin, Urine Waived (Completed)     Endocrine   Type 2 diabetes mellitus with hyperglycemia (HCC)    Doing better with A1c down to 7.1 from 8.1- will continue to work on diet and exercise and recheck 3 months. Call with any concerns.       Relevant Medications   pravastatin (PRAVACHOL) 40 MG tablet   sitaGLIPtin-metformin (JANUMET) 50-1000 MG tablet   Other Relevant Orders   Bayer DCA Hb A1c Waived (Completed)   CBC with Differential/Platelet (Completed)   Comprehensive metabolic panel (Completed)   Microalbumin, Urine Waived (Completed)   Urinalysis, Routine w reflex microscopic (Completed)   Hyperlipidemia associated with type 2 diabetes mellitus ([place])    Under good control on current regimen. Continue current regimen. Continue to monitor. Call with any concerns. Refills given. Labs drawn today.        Relevant Medications   pravastatin (PRAVACHOL) 40 MG tablet   sitaGLIPtin-metformin (JANUMET) 50-1000 MG tablet   Other Relevant Orders   CBC with Differential/Platelet (Completed)   Comprehensive metabolic panel (Completed)   Lipid Panel w/o Chol/HDL Ratio (Completed)    Other  Visit Diagnoses    Chronic right shoulder pain       Referral to PT made today. Call with any concerns. Continue to monitor.    Relevant Orders   Ambulatory referral to Physical Therapy   Screening for colon cancer       Referral to GI placed today- will try to get records from previous colonoscopy in [place].    Relevant Orders   Ambulatory referral to Gastroenterology       Follow up plan: Return in about 3 months (around 04/19/2020) for records release please .

## 2020-01-19 ENCOUNTER — Encounter: Payer: Self-pay | Admitting: Family Medicine

## 2020-01-19 LAB — URINALYSIS, ROUTINE W REFLEX MICROSCOPIC
Bilirubin, UA: NEGATIVE
Glucose, UA: NEGATIVE
Leukocytes,UA: NEGATIVE
Nitrite, UA: NEGATIVE
RBC, UA: NEGATIVE
Specific Gravity, UA: 1.02 (ref 1.005–1.030)
Urobilinogen, Ur: 0.2 mg/dL (ref 0.2–1.0)
pH, UA: 6 (ref 5.0–7.5)

## 2020-01-19 LAB — CBC WITH DIFFERENTIAL/PLATELET
Basophils Absolute: 0.1 10*3/uL (ref 0.0–0.2)
Basos: 1 %
EOS (ABSOLUTE): 0.1 10*3/uL (ref 0.0–0.4)
Eos: 1 %
Hematocrit: 38.5 % (ref 34.0–46.6)
Hemoglobin: 12.3 g/dL (ref 11.1–15.9)
Immature Grans (Abs): 0 10*3/uL (ref 0.0–0.1)
Immature Granulocytes: 0 %
Lymphocytes Absolute: 2.1 10*3/uL (ref 0.7–3.1)
Lymphs: 31 %
MCH: 26.9 pg (ref 26.6–33.0)
MCHC: 31.9 g/dL (ref 31.5–35.7)
MCV: 84 fL (ref 79–97)
Monocytes Absolute: 0.5 10*3/uL (ref 0.1–0.9)
Monocytes: 7 %
Neutrophils Absolute: 4 10*3/uL (ref 1.4–7.0)
Neutrophils: 60 %
Platelets: 368 10*3/uL (ref 150–450)
RBC: 4.58 x10E6/uL (ref 3.77–5.28)
RDW: 13.9 % (ref 11.7–15.4)
WBC: 6.8 10*3/uL (ref 3.4–10.8)

## 2020-01-19 LAB — MICROALBUMIN, URINE WAIVED
Creatinine, Urine Waived: 200 mg/dL (ref 10–300)
Microalb, Ur Waived: 80 mg/L — ABNORMAL HIGH (ref 0–19)

## 2020-01-19 LAB — COMPREHENSIVE METABOLIC PANEL
ALT: 13 IU/L (ref 0–32)
AST: 9 IU/L (ref 0–40)
Albumin/Globulin Ratio: 1.7 (ref 1.2–2.2)
Albumin: 4.3 g/dL (ref 3.8–4.9)
Alkaline Phosphatase: 65 IU/L (ref 44–121)
BUN/Creatinine Ratio: 20 (ref 12–28)
BUN: 16 mg/dL (ref 8–27)
Bilirubin Total: 0.3 mg/dL (ref 0.0–1.2)
CO2: 27 mmol/L (ref 20–29)
Calcium: 9.8 mg/dL (ref 8.7–10.3)
Chloride: 102 mmol/L (ref 96–106)
Creatinine, Ser: 0.81 mg/dL (ref 0.57–1.00)
GFR calc Af Amer: 91 mL/min/{1.73_m2} (ref >59)
GFR calc non Af Amer: 79 mL/min/{1.73_m2} (ref >59)
Globulin, Total: 2.5 g/dL (ref 1.5–4.5)
Glucose: 107 mg/dL — ABNORMAL HIGH (ref 65–99)
Potassium: 3.3 mmol/L — ABNORMAL LOW (ref 3.5–5.2)
Sodium: 142 mmol/L (ref 134–144)
Total Protein: 6.8 g/dL (ref 6.0–8.5)

## 2020-01-19 LAB — MICROSCOPIC EXAMINATION

## 2020-01-19 LAB — LIPID PANEL W/O CHOL/HDL RATIO
Cholesterol, Total: 198 mg/dL (ref 100–199)
HDL: 58 mg/dL (ref >39)
LDL Chol Calc (NIH): 118 mg/dL — ABNORMAL HIGH (ref 0–99)
Triglycerides: 124 mg/dL (ref 0–149)
VLDL Cholesterol Cal: 22 mg/dL (ref 5–40)

## 2020-01-19 LAB — BAYER DCA HB A1C WAIVED: HB A1C (BAYER DCA - WAIVED): 7.1 % — ABNORMAL HIGH (ref <7.0)

## 2020-01-19 NOTE — Assessment & Plan Note (Signed)
Under good control on current regimen. Continue current regimen. Continue to monitor. Call with any concerns. Refills given. Labs drawn today.   

## 2020-01-19 NOTE — Assessment & Plan Note (Signed)
Doing better with A1c down to 7.1 from 8.1- will continue to work on diet and exercise and recheck 3 months. Call with any concerns.

## 2020-01-20 ENCOUNTER — Ambulatory Visit: Payer: Self-pay | Admitting: Family Medicine

## 2020-01-28 ENCOUNTER — Encounter: Payer: Self-pay | Admitting: *Deleted

## 2020-03-01 ENCOUNTER — Other Ambulatory Visit: Payer: Self-pay | Admitting: Nurse Practitioner

## 2020-03-01 ENCOUNTER — Telehealth: Payer: Self-pay

## 2020-03-01 DIAGNOSIS — Z1231 Encounter for screening mammogram for malignant neoplasm of breast: Secondary | ICD-10-CM

## 2020-03-01 DIAGNOSIS — G8929 Other chronic pain: Secondary | ICD-10-CM

## 2020-03-01 NOTE — Telephone Encounter (Signed)
Does pt need an appt in regards to x-ray of shoulder and mammo. Please advise.

## 2020-03-01 NOTE — Telephone Encounter (Signed)
I placed mammogram order, she will need to call [place] to schedule.  Shoulder imaging order in, she can obtain at [facility].  Thank you.  Reviewed Dr. Wynetta Emery recent note where pain discussed.

## 2020-03-01 NOTE — Telephone Encounter (Signed)
Noted, thank you for checking on this:)

## 2020-03-01 NOTE — Telephone Encounter (Signed)
FYI

## 2020-03-01 NOTE — Telephone Encounter (Signed)
Spoke with patient. Was under the impression she did not want referral anymore. She does still want referral. [facility] has referral and all she needs to do is call and schedule. She has number and will call today to schedule. Sorry for the confusion.

## 2020-03-01 NOTE — Telephone Encounter (Signed)
Referral for physical therapy shows that pt refused. Called pt she states that she did not refuse pt. Evelena Peat closed referral 02/29/20 after speaking with with pt. Will forward message to see about mammo x ray and patches. Please advise if pt needs appt.   Copied from [place] [id number]. Topic: General - Other >> Mar 01, 2020 11:25 AM Rainey Pines A wrote: Patient would like an order placed for mammogram annd xray on shoulder and hasnt heard anything for the physical therapy  and was advised to contact pcp if she hadnt heard anything back. Patient also wants to know if Dr .Wynetta Emery can prescribe the patches for her pain. Please advise (Best contact [phone]) [phone]

## 2020-03-02 ENCOUNTER — Other Ambulatory Visit: Payer: Self-pay | Admitting: Nurse Practitioner

## 2020-03-02 MED ORDER — LIDOCAINE 5 % EX PTCH: 1.0000 | MEDICATED_PATCH | CUTANEOUS | 0 refills | Status: DC

## 2020-03-02 NOTE — Telephone Encounter (Signed)
Patient notified, would like to know if she can have a script for lidocaine patches.  Kristopher Oppenheim

## 2020-03-02 NOTE — Telephone Encounter (Signed)
Patches have sent.

## 2020-03-16 ENCOUNTER — Telehealth: Payer: Self-pay

## 2020-03-16 NOTE — Telephone Encounter (Signed)
Please advise 

## 2020-03-16 NOTE — Telephone Encounter (Signed)
Copied from CRM [id number]. Topic: General - Inquiry >> Mar 16, 2020  3:30 PM Adrian Prince D wrote: Reason for CRM: Patient called because the patches that were prescribed to her were not covered by her insurance and she couldn't afford to get them. She would like for you to call her in some pain medication for her shoulder pain. She can be reached at [phone]. Also she would like a order to get a mammogram. Please advise

## 2020-03-16 NOTE — Telephone Encounter (Signed)
She can pick up OTC lidocaine patches

## 2020-04-11 ENCOUNTER — Other Ambulatory Visit: Payer: Self-pay | Admitting: Family Medicine

## 2020-04-11 MED ORDER — AMLODIPINE BESYLATE 10 MG PO TABS
10.0000 mg | ORAL_TABLET | Freq: Every day | ORAL | 0 refills | Status: DC
Start: 2020-04-11 — End: 2020-04-21

## 2020-04-11 NOTE — Telephone Encounter (Signed)
Medication: amLODipine (NORVASC) 10 MG tablet  Has the pt contacted their pharmacy? Yes  Preferred pharmacy: [id number] [address], [place]  Please be advised refills may take up to 3 business days.  We ask that you follow up with your pharmacy.

## 2020-04-11 NOTE — Telephone Encounter (Signed)
   Notes to clinic:  medication filled by historical provider Review for refill   Requested Prescriptions  Pending Prescriptions Disp Refills   amLODipine (NORVASC) 10 MG tablet      Sig: Take 1 tablet (10 mg total) by mouth daily.      Cardiovascular:  Calcium Channel Blockers Failed - 04/11/2020 11:54 AM      Failed - Last BP in normal range    BP Readings from Last 1 Encounters:  01/18/20 (!) 146/78          Passed - Valid encounter within last 6 months    Recent Outpatient Visits           2 months ago Hypertension associated with diabetes [facility])   [place], [place]   7 months ago [place], [place]   8 months ago Hypertension associated with diabetes [facility])   [facility], [place]   1 year ago Chest pain, unspecified type   [facility], Lilia Argue, [place]       Future Appointments             In 1 week Wynetta Emery, Barb Merino, DO [employer], [facility]

## 2020-04-11 NOTE — Telephone Encounter (Signed)
Routing to provider  

## 2020-04-13 ENCOUNTER — Telehealth: Payer: Self-pay

## 2020-04-13 NOTE — Telephone Encounter (Signed)
Called pt to schedule appt no answer left vm  Copied from [place] [id number]. Topic: General - Other >> Apr 13, 2020 11:30 AM Leward Quan A wrote: Reason for CRM: Patient called to say that she is not feeling well and need to see Dr Wynetta Emery before her visit on 04/22/20. Please advise if there is anything that can be done, can be reached at  Ph# [phone]

## 2020-04-19 ENCOUNTER — Telehealth: Payer: 59 | Admitting: Family Medicine

## 2020-04-21 NOTE — Progress Notes (Signed)
BP (!) 157/85 (BP Location: Right Arm, Cuff Size: Normal)   Pulse 86   Temp 99.1 F (37.3 C) (Oral)   Ht '5\' 4"'  (1.626 m)   Wt 185 lb 12.8 oz (84.3 kg)   SpO2 97%   BMI 31.89 kg/m    Subjective:    Patient ID: Lacey Schaefer, female    DOB: 1960-11-26, 60 y.o.   MRN: [id number]  HPI: Lacey Schaefer is a 60 y.o. female  Chief Complaint  Patient presents with  . Diabetes    Pt is aware that she is due for an eye exam, requesting referral for eye exam.   . Hypertension   Patient has ongoing shoulder pain that comes and goes.  Patient has upcoming physical therapy planned.   DIABETES Hypoglycemic episodes:no Polydipsia/polyuria: no Visual disturbance: no Chest pain: no Paresthesias: no Glucose Monitoring: yes  Accucheck frequency: 2x a week: 125-160 Taking Insulin?: no Blood Pressure Monitoring: a few times a week Retinal Examination: Referral given at appointment today Foot Exam: Up To Date Diabetic Education: Completed Pneumovax: Up to Date Influenza: Declined Aspirin: yes  HYPERTENSION / HYPERLIPIDEMIA Satisfied with current treatment? yes Duration of hypertension: chronic BP monitoring frequency: a few times a week BP range: 120s-130s/70s.  Patient states that blood pressure is always elevated when she goes to the doctors. BP medication side effects: no Past BP meds: HCTZ, hydralazine, carvedilol, amlodipine Duration of hyperlipidemia: chronic Cholesterol medication side effects: no Cholesterol supplements: none Past cholesterol medications: pravastatin Medication compliance: excellent compliance Aspirin: yes Recent stressors: no Recurrent headaches: no Visual changes: no Palpitations: no Dyspnea: no Chest pain: no Lower extremity edema: no Dizzy/lightheaded: no  Relevant past medical, surgical, family and social history reviewed and updated as indicated. Interim medical history since our last visit reviewed. Allergies and medications reviewed and  updated.  Review of Systems  All other systems reviewed and are negative.   Per HPI unless specifically indicated above     Objective:    BP (!) 157/85 (BP Location: Right Arm, Cuff Size: Normal)   Pulse 86   Temp 99.1 F (37.3 C) (Oral)   Ht '5\' 4"'  (1.626 m)   Wt 185 lb 12.8 oz (84.3 kg)   SpO2 97%   BMI 31.89 kg/m   Wt Readings from Last 3 Encounters:  04/22/20 185 lb 12.8 oz (84.3 kg)  01/18/20 184 lb 12.8 oz (83.8 kg)  09/20/19 186 lb (84.4 kg)    Physical Exam Vitals and nursing note reviewed.  Constitutional:      General: She is not in acute distress.    Appearance: Normal appearance. She is not ill-appearing, toxic-appearing or diaphoretic.  HENT:     Head: Normocephalic and atraumatic.     Right Ear: External ear normal.     Left Ear: External ear normal.     Nose: Nose normal.     Mouth/Throat:     Mouth: Mucous membranes are moist.     Pharynx: Oropharynx is clear.  Eyes:     General: No scleral icterus.       Right eye: No discharge.        Left eye: No discharge.     Extraocular Movements: Extraocular movements intact.     Conjunctiva/sclera: Conjunctivae normal.     Pupils: Pupils are equal, round, and reactive to light.  Cardiovascular:     Rate and Rhythm: Normal rate and regular rhythm.     Pulses: Normal pulses.  Heart sounds: Normal heart sounds. No murmur heard. No friction rub. No gallop.   Pulmonary:     Effort: Pulmonary effort is normal. No respiratory distress.     Breath sounds: Normal breath sounds. No stridor. No wheezing, rhonchi or rales.  Chest:     Chest wall: No tenderness.  Musculoskeletal:        General: Normal range of motion.     Cervical back: Normal range of motion and neck supple.  Skin:    General: Skin is warm and dry.     Capillary Refill: Capillary refill takes less than 2 seconds.     Coloration: Skin is not jaundiced or pale.     Findings: No bruising, erythema, lesion or rash.  Neurological:     General:  No focal deficit present.     Mental Status: She is alert and oriented to person, place, and time. Mental status is at baseline.  Psychiatric:        Mood and Affect: Mood normal.        Behavior: Behavior normal.        Thought Content: Thought content normal.        Judgment: Judgment normal.     Results for orders placed or performed in visit on 01/18/20  Microscopic Examination   Urine  Result Value Ref Range   WBC, UA 0-5 0 - 5 /hpf   RBC 0-2 0 - 2 /hpf   Epithelial Cells (non renal) 0-10 0 - 10 /hpf   Mucus, UA Present Not Estab.   Bacteria, UA Few (A) None seen/Few  Bayer DCA Hb A1c Waived  Result Value Ref Range   HB A1C (BAYER DCA - WAIVED) 7.1 (H) <7.0 %  CBC with Differential/Platelet  Result Value Ref Range   WBC 6.8 3.4 - 10.8 x10E3/uL   RBC 4.58 3.77 - 5.28 x10E6/uL   Hemoglobin 12.3 11.1 - 15.9 g/dL   Hematocrit 38.5 34.0 - 46.6 %   MCV 84 79 - 97 fL   MCH 26.9 26.6 - 33.0 pg   MCHC 31.9 31.5 - 35.7 g/dL   RDW 13.9 11.7 - 15.4 %   Platelets 368 150 - 450 x10E3/uL   Neutrophils 60 Not Estab. %   Lymphs 31 Not Estab. %   Monocytes 7 Not Estab. %   Eos 1 Not Estab. %   Basos 1 Not Estab. %   Neutrophils Absolute 4.0 1.4 - 7.0 x10E3/uL   Lymphocytes Absolute 2.1 0.7 - 3.1 x10E3/uL   Monocytes Absolute 0.5 0.1 - 0.9 x10E3/uL   EOS (ABSOLUTE) 0.1 0.0 - 0.4 x10E3/uL   Basophils Absolute 0.1 0.0 - 0.2 x10E3/uL   Immature Granulocytes 0 Not Estab. %   Immature Grans (Abs) 0.0 0.0 - 0.1 x10E3/uL  Comprehensive metabolic panel  Result Value Ref Range   Glucose 107 (H) 65 - 99 mg/dL   BUN 16 8 - 27 mg/dL   Creatinine, Ser 0.81 0.57 - 1.00 mg/dL   GFR calc non Af Amer 79 >59 mL/min/1.73   GFR calc Af Amer 91 >59 mL/min/1.73   BUN/Creatinine Ratio 20 12 - 28   Sodium 142 134 - 144 mmol/L   Potassium 3.3 (L) 3.5 - 5.2 mmol/L   Chloride 102 96 - 106 mmol/L   CO2 27 20 - 29 mmol/L   Calcium 9.8 8.7 - 10.3 mg/dL   Total Protein 6.8 6.0 - 8.5 g/dL   Albumin 4.3  3.8 - 4.9 g/dL   Globulin,  Total 2.5 1.5 - 4.5 g/dL   Albumin/Globulin Ratio 1.7 1.2 - 2.2   Bilirubin Total 0.3 0.0 - 1.2 mg/dL   Alkaline Phosphatase 65 44 - 121 IU/L   AST 9 0 - 40 IU/L   ALT 13 0 - 32 IU/L  Lipid Panel w/o Chol/HDL Ratio  Result Value Ref Range   Cholesterol, Total 198 100 - 199 mg/dL   Triglycerides 124 0 - 149 mg/dL   HDL 58 >39 mg/dL   VLDL Cholesterol Cal 22 5 - 40 mg/dL   LDL Chol Calc (NIH) 118 (H) 0 - 99 mg/dL  Microalbumin, Urine Waived  Result Value Ref Range   Microalb, Ur Waived 80 (H) 0 - 19 mg/L   Creatinine, Urine Waived 200 10 - 300 mg/dL   Microalb/Creat Ratio 30-300 (H) <30 mg/g  Urinalysis, Routine w reflex microscopic  Result Value Ref Range   Specific Gravity, UA 1.020 1.005 - 1.030   pH, UA 6.0 5.0 - 7.5   Color, UA Yellow Yellow   Appearance Ur Clear Clear   Leukocytes,UA Negative Negative   Protein,UA 1+ (A) Negative/Trace   Glucose, UA Negative Negative   Ketones, UA Trace (A) Negative   RBC, UA Negative Negative   Bilirubin, UA Negative Negative   Urobilinogen, Ur 0.2 0.2 - 1.0 mg/dL   Nitrite, UA Negative Negative   Microscopic Examination See below:       Assessment & Plan:   Problem List Items Addressed This Visit      Cardiovascular and Mediastinum   Hypertension associated with diabetes ([place]) - Primary    Under good control on current regimen. Advised patient to continue to monitor BP at home and call the office if BP is >140/90.  Continue current regimen. Continue to monitor. Call with any concerns. Refills given. Labs drawn today.         Relevant Medications   amLODipine (NORVASC) 10 MG tablet   hydrochlorothiazide (HYDRODIURIL) 25 MG tablet   pravastatin (PRAVACHOL) 40 MG tablet   sitaGLIPtin-metformin (JANUMET) 50-1000 MG tablet   Other Relevant Orders   Comp Met (CMET)     Endocrine   Type 2 diabetes mellitus with hyperglycemia (HCC)    Doing better with A1c up to 7.6 from 7.1.  Counseled patient in  resuming exercise routine and decreasing carbohydrate intake.  Will recheck in 3 months and adjust medications at that time if necessary.Call with any concerns.       Relevant Medications   pravastatin (PRAVACHOL) 40 MG tablet   sitaGLIPtin-metformin (JANUMET) 50-1000 MG tablet   Other Relevant Orders   Bayer DCA Hb A1c Waived   Ambulatory referral to Ophthalmology   Hyperlipidemia associated with type 2 diabetes mellitus ([place])    Under good control on current regimen. Continue current regimen. Continue to monitor. Call with any concerns. Refills given. Labs drawn today.        Relevant Medications   pravastatin (PRAVACHOL) 40 MG tablet   sitaGLIPtin-metformin (JANUMET) 50-1000 MG tablet    Other Visit Diagnoses    Encounter for gynecological examination without abnormal finding       Relevant Orders   Ambulatory referral to Gynecology       Follow up plan: Return in about 3 months (around 07/20/2020) for HTN, HLD, DM2 FU.

## 2020-04-22 ENCOUNTER — Ambulatory Visit: Payer: 59 | Admitting: Family Medicine

## 2020-04-22 ENCOUNTER — Other Ambulatory Visit: Payer: Self-pay

## 2020-04-22 ENCOUNTER — Encounter: Payer: Self-pay | Admitting: Nurse Practitioner

## 2020-04-22 ENCOUNTER — Ambulatory Visit (INDEPENDENT_AMBULATORY_CARE_PROVIDER_SITE_OTHER): Payer: 59 | Admitting: Nurse Practitioner

## 2020-04-22 VITALS — BP 157/85 | HR 86 | Temp 99.1°F | Ht 64.0 in | Wt 185.8 lb

## 2020-04-22 DIAGNOSIS — E1159 Type 2 diabetes mellitus with other circulatory complications: Secondary | ICD-10-CM | POA: Diagnosis not present

## 2020-04-22 DIAGNOSIS — I152 Hypertension secondary to endocrine disorders: Secondary | ICD-10-CM

## 2020-04-22 DIAGNOSIS — E1169 Type 2 diabetes mellitus with other specified complication: Secondary | ICD-10-CM | POA: Diagnosis not present

## 2020-04-22 DIAGNOSIS — E785 Hyperlipidemia, unspecified: Secondary | ICD-10-CM

## 2020-04-22 DIAGNOSIS — Z01419 Encounter for gynecological examination (general) (routine) without abnormal findings: Secondary | ICD-10-CM | POA: Diagnosis not present

## 2020-04-22 DIAGNOSIS — E1165 Type 2 diabetes mellitus with hyperglycemia: Secondary | ICD-10-CM

## 2020-04-22 LAB — BAYER DCA HB A1C WAIVED: HB A1C (BAYER DCA - WAIVED): 7.6 % — ABNORMAL HIGH (ref <7.0)

## 2020-04-22 MED ORDER — AMLODIPINE BESYLATE 10 MG PO TABS: 10.0000 mg | ORAL_TABLET | Freq: Every day | ORAL | 1 refills | Status: DC

## 2020-04-22 MED ORDER — PRAVASTATIN SODIUM 40 MG PO TABS: 40.0000 mg | ORAL_TABLET | Freq: Every day | ORAL | 1 refills | Status: DC

## 2020-04-22 MED ORDER — HYDROCHLOROTHIAZIDE 25 MG PO TABS: 25.0000 mg | ORAL_TABLET | Freq: Every day | ORAL | 1 refills | Status: DC

## 2020-04-22 MED ORDER — SITAGLIPTIN PHOS-METFORMIN HCL 50-1000 MG PO TABS: 1.0000 | ORAL_TABLET | Freq: Two times a day (BID) | ORAL | 1 refills | Status: DC

## 2020-04-22 NOTE — Assessment & Plan Note (Signed)
Under good control on current regimen. Continue current regimen. Continue to monitor. Call with any concerns. Refills given. Labs drawn today.   

## 2020-04-22 NOTE — Assessment & Plan Note (Addendum)
Doing better with A1c up to 7.6 from 7.1.  Counseled patient in resuming exercise routine and decreasing carbohydrate intake.  Will recheck in 3 months and adjust medications at that time if necessary.Call with any concerns.

## 2020-04-22 NOTE — Assessment & Plan Note (Addendum)
Under good control on current regimen. Advised patient to continue to monitor BP at home and call the office if BP is >140/90.  Continue current regimen. Continue to monitor. Call with any concerns. Refills given. Labs drawn today.

## 2020-04-22 NOTE — Patient Instructions (Signed)
Hypertension, Adult Hypertension is another name for high blood pressure. High blood pressure forces your heart to work harder to pump blood. This can cause problems over time. There are two numbers in a blood pressure reading. There is a top number (systolic) over a bottom number (diastolic). It is best to have a blood pressure that is below 120/80. Healthy choices can help lower your blood pressure, or you may need medicine to help lower it. What are the causes? The cause of this condition is not known. Some conditions may be related to high blood pressure. What increases the risk?  Smoking.  Having type 2 diabetes mellitus, high cholesterol, or both.  Not getting enough exercise or physical activity.  Being overweight.  Having too much fat, sugar, calories, or salt (sodium) in your diet.  Drinking too much alcohol.  Having long-term (chronic) kidney disease.  Having a family history of high blood pressure.  Age. Risk increases with age.  Race. You may be at higher risk if you are African American.  Gender. Men are at higher risk than women before age 45. After age 65, women are at higher risk than men.  Having obstructive sleep apnea.  Stress. What are the signs or symptoms?  High blood pressure may not cause symptoms. Very high blood pressure (hypertensive crisis) may cause: ? Headache. ? Feelings of worry or nervousness (anxiety). ? Shortness of breath. ? Nosebleed. ? A feeling of being sick to your stomach (nausea). ? Throwing up (vomiting). ? Changes in how you see. ? Very bad chest pain. ? Seizures. How is this treated?  This condition is treated by making healthy lifestyle changes, such as: ? Eating healthy foods. ? Exercising more. ? Drinking less alcohol.  Your health care provider may prescribe medicine if lifestyle changes are not enough to get your blood pressure under control, and if: ? Your top number is above 130. ? Your bottom number is above  80.  Your personal target blood pressure may vary. Follow these instructions at home: Eating and drinking  If told, follow the DASH eating plan. To follow this plan: ? Fill one half of your plate at each meal with fruits and vegetables. ? Fill one fourth of your plate at each meal with whole grains. Whole grains include whole-wheat pasta, brown rice, and whole-grain bread. ? Eat or drink low-fat dairy products, such as skim milk or low-fat yogurt. ? Fill one fourth of your plate at each meal with low-fat (lean) proteins. Low-fat proteins include fish, chicken without skin, eggs, beans, and tofu. ? Avoid fatty meat, cured and processed meat, or chicken with skin. ? Avoid pre-made or processed food.  Eat less than 1,500 mg of salt each day.  Do not drink alcohol if: ? Your doctor tells you not to drink. ? You are pregnant, may be pregnant, or are planning to become pregnant.  If you drink alcohol: ? Limit how much you use to:  0-1 drink a day for women.  0-2 drinks a day for men. ? Be aware of how much alcohol is in your drink. In the [place] one drink equals one 12 oz bottle of beer (355 mL), one 5 oz glass of wine (148 mL), or one 1 oz glass of hard liquor (44 mL).   Lifestyle  Work with your doctor to stay at a healthy weight or to lose weight. Ask your doctor what the best weight is for you.  Get at least 30 minutes of exercise most   days of the week. This may include walking, swimming, or biking.  Get at least 30 minutes of exercise that strengthens your muscles (resistance exercise) at least 3 days a week. This may include lifting weights or doing Pilates.  Do not use any products that contain nicotine or tobacco, such as cigarettes, e-cigarettes, and chewing tobacco. If you need help quitting, ask your doctor.  Check your blood pressure at home as told by your doctor.  Keep all follow-up visits as told by your doctor. This is important.   Medicines  Take over-the-counter  and prescription medicines only as told by your doctor. Follow directions carefully.  Do not skip doses of blood pressure medicine. The medicine does not work as well if you skip doses. Skipping doses also puts you at risk for problems.  Ask your doctor about side effects or reactions to medicines that you should watch for. Contact a doctor if you:  Think you are having a reaction to the medicine you are taking.  Have headaches that keep coming back (recurring).  Feel dizzy.  Have swelling in your ankles.  Have trouble with your vision. Get help right away if you:  Get a very bad headache.  Start to feel mixed up (confused).  Feel weak or numb.  Feel faint.  Have very bad pain in your: ? Chest. ? Belly (abdomen).  Throw up more than once.  Have trouble breathing. Summary  Hypertension is another name for high blood pressure.  High blood pressure forces your heart to work harder to pump blood.  For most people, a normal blood pressure is less than 120/80.  Making healthy choices can help lower blood pressure. If your blood pressure does not get lower with healthy choices, you may need to take medicine. This information is not intended to replace advice given to you by your health care provider. Make sure you discuss any questions you have with your health care provider. Document Revised: 11/13/2017 Document Reviewed: 11/13/2017 Elsevier Patient Education  2021 [employer]. Diabetes Mellitus and Nutrition, Adult When you have diabetes, or diabetes mellitus, it is very important to have healthy eating habits because your blood sugar (glucose) levels are greatly affected by what you eat and drink. Eating healthy foods in the right amounts, at about the same times every day, can help you:  Control your blood glucose.  Lower your risk of heart disease.  Improve your blood pressure.  Reach or maintain a healthy weight. What can affect my meal plan? Every person with  diabetes is different, and each person has different needs for a meal plan. Your health care provider may recommend that you work with a dietitian to make a meal plan that is best for you. Your meal plan may vary depending on factors such as:  The calories you need.  The medicines you take.  Your weight.  Your blood glucose, blood pressure, and cholesterol levels.  Your activity level.  Other health conditions you have, such as heart or kidney disease. How do carbohydrates affect me? Carbohydrates, also called carbs, affect your blood glucose level more than any other type of food. Eating carbs naturally raises the amount of glucose in your blood. Carb counting is a method for keeping track of how many carbs you eat. Counting carbs is important to keep your blood glucose at a healthy level, especially if you use insulin or take certain oral diabetes medicines. It is important to know how many carbs you can safely have in  each meal. This is different for every person. Your dietitian can help you calculate how many carbs you should have at each meal and for each snack. How does alcohol affect me? Alcohol can cause a sudden decrease in blood glucose (hypoglycemia), especially if you use insulin or take certain oral diabetes medicines. Hypoglycemia can be a life-threatening condition. Symptoms of hypoglycemia, such as sleepiness, dizziness, and confusion, are similar to symptoms of having too much alcohol.  Do not drink alcohol if: ? Your health care provider tells you not to drink. ? You are pregnant, may be pregnant, or are planning to become pregnant.  If you drink alcohol: ? Do not drink on an empty stomach. ? Limit how much you use to:  0-1 drink a day for women.  0-2 drinks a day for men. ? Be aware of how much alcohol is in your drink. In the [place] one drink equals one 12 oz bottle of beer (355 mL), one 5 oz glass of wine (148 mL), or one 1 oz glass of hard liquor (44 mL). ? Keep  yourself hydrated with water, diet soda, or unsweetened iced tea.  Keep in mind that regular soda, juice, and other mixers may contain a lot of sugar and must be counted as carbs. What are tips for following this plan? Reading food labels  Start by checking the serving size on the "Nutrition Facts" label of packaged foods and drinks. The amount of calories, carbs, fats, and other nutrients listed on the label is based on one serving of the item. Many items contain more than one serving per package.  Check the total grams (g) of carbs in one serving. You can calculate the number of servings of carbs in one serving by dividing the total carbs by 15. For example, if a food has 30 g of total carbs per serving, it would be equal to 2 servings of carbs.  Check the number of grams (g) of saturated fats and trans fats in one serving. Choose foods that have a low amount or none of these fats.  Check the number of milligrams (mg) of salt (sodium) in one serving. Most people should limit total sodium intake to less than 2,300 mg per day.  Always check the nutrition information of foods labeled as "low-fat" or "nonfat." These foods may be higher in added sugar or refined carbs and should be avoided.  Talk to your dietitian to identify your daily goals for nutrients listed on the label. Shopping  Avoid buying canned, pre-made, or processed foods. These foods tend to be high in fat, sodium, and added sugar.  Shop around the outside edge of the grocery store. This is where you will most often find fresh fruits and vegetables, bulk grains, fresh meats, and fresh dairy. Cooking  Use low-heat cooking methods, such as baking, instead of high-heat cooking methods like deep frying.  Cook using healthy oils, such as olive, canola, or sunflower oil.  Avoid cooking with butter, cream, or high-fat meats. Meal planning  Eat meals and snacks regularly, preferably at the same times every day. Avoid going long  periods of time without eating.  Eat foods that are high in fiber, such as fresh fruits, vegetables, beans, and whole grains. Talk with your dietitian about how many servings of carbs you can eat at each meal.  Eat 4-6 oz (112-168 g) of lean protein each day, such as lean meat, chicken, fish, eggs, or tofu. One ounce (oz) of lean protein is equal  to: ? 1 oz (28 g) of meat, chicken, or fish. ? 1 egg. ?  cup (62 g) of tofu.  Eat some foods each day that contain healthy fats, such as avocado, nuts, seeds, and fish.   What foods should I eat? Fruits Berries. Apples. Oranges. Peaches. Apricots. Plums. Grapes. Mango. Papaya. Pomegranate. Kiwi. Cherries. Vegetables Lettuce. Spinach. Leafy greens, including kale, chard, collard greens, and mustard greens. Beets. Cauliflower. Cabbage. Broccoli. Carrots. Green beans. Tomatoes. Peppers. Onions. Cucumbers. Brussels sprouts. Grains Whole grains, such as whole-wheat or whole-grain bread, crackers, tortillas, cereal, and pasta. Unsweetened oatmeal. Quinoa. Brown or wild rice. Meats and other proteins Seafood. Poultry without skin. Lean cuts of poultry and beef. Tofu. Nuts. Seeds. Dairy Low-fat or fat-free dairy products such as milk, yogurt, and cheese. The items listed above may not be a complete list of foods and beverages you can eat. Contact a dietitian for more information. What foods should I avoid? Fruits Fruits canned with syrup. Vegetables Canned vegetables. Frozen vegetables with butter or cream sauce. Grains Refined white flour and flour products such as bread, pasta, snack foods, and cereals. Avoid all processed foods. Meats and other proteins Fatty cuts of meat. Poultry with skin. Breaded or fried meats. Processed meat. Avoid saturated fats. Dairy Full-fat yogurt, cheese, or milk. Beverages Sweetened drinks, such as soda or iced tea. The items listed above may not be a complete list of foods and beverages you should avoid. Contact a  dietitian for more information. Questions to ask a health care provider  Do I need to meet with a diabetes educator?  Do I need to meet with a dietitian?  What number can I call if I have questions?  When are the best times to check my blood glucose? Where to find more information:  American Diabetes Association: diabetes.org  Academy of Nutrition and Dietetics: www.eatright.[employer] of Diabetes and Digestive and Kidney Diseases: [employer]  Association of Diabetes Care and Education Specialists: www.diabeteseducator.org Summary  It is important to have healthy eating habits because your blood sugar (glucose) levels are greatly affected by what you eat and drink.  A healthy meal plan will help you control your blood glucose and maintain a healthy lifestyle.  Your health care provider may recommend that you work with a dietitian to make a meal plan that is best for you.  Keep in mind that carbohydrates (carbs) and alcohol have immediate effects on your blood glucose levels. It is important to count carbs and to use alcohol carefully. This information is not intended to replace advice given to you by your health care provider. Make sure you discuss any questions you have with your health care provider. Document Revised: 02/10/2019 Document Reviewed: 02/10/2019 Elsevier Patient Education  2021 [employer].

## 2020-04-23 LAB — COMPREHENSIVE METABOLIC PANEL
ALT: 19 IU/L (ref 0–32)
AST: 15 IU/L (ref 0–40)
Albumin/Globulin Ratio: 1.8 (ref 1.2–2.2)
Albumin: 4.2 g/dL (ref 3.8–4.8)
Alkaline Phosphatase: 62 IU/L (ref 44–121)
BUN/Creatinine Ratio: 18 (ref 12–28)
BUN: 13 mg/dL (ref 8–27)
Bilirubin Total: 0.3 mg/dL (ref 0.0–1.2)
CO2: 26 mmol/L (ref 20–29)
Calcium: 9.4 mg/dL (ref 8.7–10.3)
Chloride: 102 mmol/L (ref 96–106)
Creatinine, Ser: 0.71 mg/dL (ref 0.57–1.00)
GFR calc Af Amer: 106 mL/min/{1.73_m2} (ref >59)
GFR calc non Af Amer: 92 mL/min/{1.73_m2} (ref >59)
Globulin, Total: 2.3 g/dL (ref 1.5–4.5)
Glucose: 189 mg/dL — ABNORMAL HIGH (ref 65–99)
Potassium: 3.2 mmol/L — ABNORMAL LOW (ref 3.5–5.2)
Sodium: 141 mmol/L (ref 134–144)
Total Protein: 6.5 g/dL (ref 6.0–8.5)

## 2020-04-25 ENCOUNTER — Telehealth: Payer: Self-pay

## 2020-04-25 NOTE — Telephone Encounter (Signed)
[facility] referring for Gyn visit. Called and left voicemail for patient to call back to be scheduled.

## 2020-04-26 NOTE — Telephone Encounter (Signed)
Called and left voicemail for patient to call back to be scheduled. 

## 2020-05-13 ENCOUNTER — Encounter: Payer: 59 | Admitting: Advanced Practice Midwife

## 2020-07-06 ENCOUNTER — Telehealth: Payer: Self-pay

## 2020-07-06 NOTE — Telephone Encounter (Signed)
Lvm to schedule appt   Copied from [place] [id number]. Topic: General - Other >> Jul 06, 2020  4:29 PM Tessa Lerner A wrote: Reason for CRM: Patient has made contact requesting for their PCP to submit orders for them to have a chest x ray  The chest Xray is required for patient's employment  The Patient is also interested in knowing when the mobile imaging unit will be available for their mammogram as well  Please contact to further advise when possible

## 2020-07-22 ENCOUNTER — Ambulatory Visit: Payer: 59 | Admitting: Nurse Practitioner

## 2020-07-22 NOTE — Progress Notes (Deleted)
There were no vitals taken for this visit.   Subjective:    Patient ID: Lacey Schaefer, female    DOB: April 28, 1960, 60 y.o.   MRN: [id number]  HPI: Lacey Schaefer is a 60 y.o. female  No chief complaint on file.  Patient has ongoing shoulder pain that comes and goes.  Patient has upcoming physical therapy planned.   DIABETES Hypoglycemic episodes:no Polydipsia/polyuria: no Visual disturbance: no Chest pain: no Paresthesias: no Glucose Monitoring: yes  Accucheck frequency: 2x a week: 125-160 Taking Insulin?: no Blood Pressure Monitoring: a few times a week Retinal Examination: Referral given at appointment today Foot Exam: Up To Date Diabetic Education: Completed Pneumovax: Up to Date Influenza: Declined Aspirin: yes  HYPERTENSION / HYPERLIPIDEMIA Satisfied with current treatment? yes Duration of hypertension: chronic BP monitoring frequency: a few times a week BP range: 120s-130s/70s.  Patient states that blood pressure is always elevated when she goes to the doctors. BP medication side effects: no Past BP meds: HCTZ, hydralazine, carvedilol, amlodipine Duration of hyperlipidemia: chronic Cholesterol medication side effects: no Cholesterol supplements: none Past cholesterol medications: pravastatin Medication compliance: excellent compliance Aspirin: yes Recent stressors: no Recurrent headaches: no Visual changes: no Palpitations: no Dyspnea: no Chest pain: no Lower extremity edema: no Dizzy/lightheaded: no  Relevant past medical, surgical, family and social history reviewed and updated as indicated. Interim medical history since our last visit reviewed. Allergies and medications reviewed and updated.  Review of Systems  All other systems reviewed and are negative.   Per HPI unless specifically indicated above     Objective:    There were no vitals taken for this visit.  Wt Readings from Last 3 Encounters:  04/22/20 185 lb 12.8 oz (84.3 kg)   01/18/20 184 lb 12.8 oz (83.8 kg)  09/20/19 186 lb (84.4 kg)    Physical Exam Vitals and nursing note reviewed.  Constitutional:      General: She is not in acute distress.    Appearance: Normal appearance. She is not ill-appearing, toxic-appearing or diaphoretic.  HENT:     Head: Normocephalic and atraumatic.     Right Ear: External ear normal.     Left Ear: External ear normal.     Nose: Nose normal.     Mouth/Throat:     Mouth: Mucous membranes are moist.     Pharynx: Oropharynx is clear.  Eyes:     General: No scleral icterus.       Right eye: No discharge.        Left eye: No discharge.     Extraocular Movements: Extraocular movements intact.     Conjunctiva/sclera: Conjunctivae normal.     Pupils: Pupils are equal, round, and reactive to light.  Cardiovascular:     Rate and Rhythm: Normal rate and regular rhythm.     Pulses: Normal pulses.     Heart sounds: Normal heart sounds. No murmur heard. No friction rub. No gallop.   Pulmonary:     Effort: Pulmonary effort is normal. No respiratory distress.     Breath sounds: Normal breath sounds. No stridor. No wheezing, rhonchi or rales.  Chest:     Chest wall: No tenderness.  Musculoskeletal:        General: Normal range of motion.     Cervical back: Normal range of motion and neck supple.  Skin:    General: Skin is warm and dry.     Capillary Refill: Capillary refill takes less than 2 seconds.  Coloration: Skin is not jaundiced or pale.     Findings: No bruising, erythema, lesion or rash.  Neurological:     General: No focal deficit present.     Mental Status: She is alert and oriented to person, place, and time. Mental status is at baseline.  Psychiatric:        Mood and Affect: Mood normal.        Behavior: Behavior normal.        Thought Content: Thought content normal.        Judgment: Judgment normal.     Results for orders placed or performed in visit on 04/22/20  Comp Met (CMET)  Result Value Ref  Range   Glucose 189 (H) 65 - 99 mg/dL   BUN 13 8 - 27 mg/dL   Creatinine, Ser 0.71 0.57 - 1.00 mg/dL   GFR calc non Af Amer 92 >59 mL/min/1.73   GFR calc Af Amer 106 >59 mL/min/1.73   BUN/Creatinine Ratio 18 12 - 28   Sodium 141 134 - 144 mmol/L   Potassium 3.2 (L) 3.5 - 5.2 mmol/L   Chloride 102 96 - 106 mmol/L   CO2 26 20 - 29 mmol/L   Calcium 9.4 8.7 - 10.3 mg/dL   Total Protein 6.5 6.0 - 8.5 g/dL   Albumin 4.2 3.8 - 4.8 g/dL   Globulin, Total 2.3 1.5 - 4.5 g/dL   Albumin/Globulin Ratio 1.8 1.2 - 2.2   Bilirubin Total 0.3 0.0 - 1.2 mg/dL   Alkaline Phosphatase 62 44 - 121 IU/L   AST 15 0 - 40 IU/L   ALT 19 0 - 32 IU/L  Bayer DCA Hb A1c Waived  Result Value Ref Range   HB A1C (BAYER DCA - WAIVED) 7.6 (H) <7.0 %      Assessment & Plan:   Problem List Items Addressed This Visit   None      Follow up plan: No follow-ups on file.

## 2020-07-31 ENCOUNTER — Encounter: Admit: 2020-07-31 | Payer: PRIVATE HEALTH INSURANCE | Attending: Adult Health | Primary: Family Medicine

## 2020-08-01 ENCOUNTER — Telehealth: Admit: 2020-08-01 | Payer: PRIVATE HEALTH INSURANCE | Attending: Adult Health | Primary: Family Medicine

## 2020-08-01 MED ORDER — POTASSIUM CHLORIDE 20 MEQ ORAL PACKET
20 mEq | PACK | 1 refills | Status: AC
Start: 2020-08-01 — End: ?

## 2020-08-01 NOTE — Telephone Encounter
LEFT MSG TO SCHEDULE----- Message from Lavada Mesi, RN sent at 08/01/2020  8:43 AM EDT -----OVer due for an appt

## 2020-08-01 NOTE — Telephone Encounter
Patient has consistently cancelled or not come in for appointments including testing.  I will refill for 30 days only.  Perhaps PCP can fill going forward -- note that I have not filled meds in over a year (last refills in 2021)

## 2020-08-01 NOTE — Telephone Encounter
Overdue for an appt

## 2020-09-01 ENCOUNTER — Ambulatory Visit: Payer: 59 | Admitting: Dermatology

## 2020-09-01 ENCOUNTER — Other Ambulatory Visit: Payer: Self-pay

## 2020-09-01 DIAGNOSIS — L820 Inflamed seborrheic keratosis: Secondary | ICD-10-CM

## 2020-09-01 DIAGNOSIS — D2371 Other benign neoplasm of skin of right lower limb, including hip: Secondary | ICD-10-CM

## 2020-09-01 DIAGNOSIS — L818 Other specified disorders of pigmentation: Secondary | ICD-10-CM | POA: Diagnosis not present

## 2020-09-01 DIAGNOSIS — L814 Other melanin hyperpigmentation: Secondary | ICD-10-CM | POA: Diagnosis not present

## 2020-09-01 DIAGNOSIS — L988 Other specified disorders of the skin and subcutaneous tissue: Secondary | ICD-10-CM | POA: Diagnosis not present

## 2020-09-01 DIAGNOSIS — D239 Other benign neoplasm of skin, unspecified: Secondary | ICD-10-CM

## 2020-09-01 DIAGNOSIS — L853 Xerosis cutis: Secondary | ICD-10-CM

## 2020-09-01 NOTE — Patient Instructions (Addendum)
Cryotherapy Aftercare  Wash gently with soap and water everyday.   Apply Vaseline and Band-Aid daily until healed.   Gentle Skin Care Guide  1. Bathe no more than once a day.  2. Avoid bathing in hot water  3. Use a mild soap like Dove, Vanicream, Cetaphil, CeraVe. Can use Lever 2000 or Cetaphil antibacterial soap  4. Use soap only where you need it. On most days, use it under your arms, between your legs, and on your feet. Let the water rinse other areas unless visibly dirty.  5. When you get out of the bath/shower, use a towel to gently blot your skin dry, don't rub it.  6. While your skin is still a little damp, apply a moisturizing cream such as Vanicream, CeraVe, Cetaphil, Eucerin, Sarna lotion or plain Vaseline Jelly. For hands apply Neutrogena Norwegian Hand Cream or Excipial Hand Cream.  7. Reapply moisturizer any time you start to itch or feel dry.  8. Sometimes using free and clear laundry detergents can be helpful. Fabric softener sheets should be avoided. Downy Free & Gentle liquid, or any liquid fabric softener that is free of dyes and perfumes, it acceptable to use  9. If your doctor has given you prescription creams you may apply moisturizers over them     If you have any questions or concerns for your doctor, please call our main line at [phone] and press option 4 to reach your doctor's medical assistant. If no one answers, please leave a voicemail as directed and we will return your call as soon as possible. Messages left after 4 pm will be answered the following business day.   You may also send us a message via MyChart. We typically respond to MyChart messages within 1-2 business days.  For prescription refills, please ask your pharmacy to contact our office. Our fax number is [phone].  If you have an urgent issue when the clinic is closed that cannot wait until the next business day, you can page your doctor at the number below.    Please note that  while we do our best to be available for urgent issues outside of office hours, we are not available 24/7.   If you have an urgent issue and are unable to reach us, you may choose to seek medical care at your doctor's office, retail clinic, urgent care center, or emergency room.  If you have a medical emergency, please immediately call 911 or go to the emergency department.  Pager Numbers  - Dr. Kowalski: [phone]  - Dr. Moye: [phone]  - Dr. Stewart: [phone]  In the event of inclement weather, please call our main line at [phone] for an update on the status of any delays or closures.  Dermatology Medication Tips: Please keep the boxes that topical medications come in in order to help keep track of the instructions about where and how to use these. Pharmacies typically print the medication instructions only on the boxes and not directly on the medication tubes.   If your medication is too expensive, please contact our office at [phone] option 4 or send us a message through MyChart.   We are unable to tell what your co-pay for medications will be in advance as this is different depending on your insurance coverage. However, we may be able to find a substitute medication at lower cost or fill out paperwork to get insurance to cover a needed medication.   If a prior authorization is required to get your medication   covered by your insurance company, please allow us 1-2 business days to complete this process.  Drug prices often vary depending on where the prescription is filled and some pharmacies may offer cheaper prices.  The website www.goodrx.com contains coupons for medications through different pharmacies. The prices here do not account for what the cost may be with help from insurance (it may be cheaper with your insurance), but the website can give you the price if you did not use any insurance.  - You can print the associated coupon and take it with your  prescription to the pharmacy.  - You may also stop by our office during regular business hours and pick up a GoodRx coupon card.  - If you need your prescription sent electronically to a different pharmacy, notify our office through [facility] MyChart or by phone at [phone] option 4.  

## 2020-09-01 NOTE — Progress Notes (Signed)
   Follow-Up Visit   Subjective  Lacey Schaefer is a 60 y.o. female who presents for the following: Other (Spots of right thigh and back that are sometimes itchy).    The following portions of the chart were reviewed this encounter and updated as appropriate:        Review of Systems:  No other skin or systemic complaints except as noted in HPI or Assessment and Plan.  Objective  Well appearing patient in no apparent distress; mood and affect are within normal limits.  A focused examination was performed including face, legs. Relevant physical exam findings are noted in the Assessment and Plan.  Legs Scattered tiny hypopigmented macules  Face Scattered tan macules.   Head - Anterior (Face) Rhytides and volume loss. Some puffiness infraocular area   Right Thigh - Anterior Erythematous keratotic or waxy stuck-on papule  Right inf knee Firm dark brown papulenodule with dimple sign.    Assessment & Plan   Xerosis - Back - diffuse xerotic patches - recommend gentle, hydrating skin care - gentle skin care handout given - Cerave Itch Relief lotion sample given    Idiopathic guttate hypomelanosis Legs  Benign condition, may be genetic but cause unknown.  No treatment. Observation  Lentigines Face  Benign, observe  Recommend daily broad spectrum sunscreen SPF 30+ to sun-exposed areas, reapply every 2 hours as needed. Call for new or changing lesions.   Elastosis of skin Head - Anterior (Face)  Discussed filler to tear trough areas. Advised patient fee is $650/syringe.  Inflamed seborrheic keratosis Right Thigh - Anterior  Reassured benign age-related growth.  Discussed cryotherapy since spot is irritated and itchy.  May leave light spot in treated area.  Destruction of lesion - Right Thigh - Anterior  Destruction method: cryotherapy   Informed consent: discussed and consent obtained   Lesion destroyed using liquid nitrogen: Yes   Region frozen until ice  ball extended beyond lesion: Yes   Outcome: patient tolerated procedure well with no complications   Post-procedure details: wound care instructions given   Additional details:  Prior to procedure, discussed risks of blister formation, small wound, skin dyspigmentation, or rare scar following cryotherapy. Recommend Vaseline ointment to treated areas while healing.   Dermatofibroma Right inf knee  Benign growth possibly related to trauma, such as an insect bite.  Discussed removal (shave vrs excision), resulting scar and risk of recurrence. Since not bothersome, will observe for now.  Return if symptoms worsen or fail to improve.  I, Ashok Cordia, CMA, am acting as scribe for Brendolyn Patty, MD .  Documentation: I have reviewed the above documentation for accuracy and completeness, and I agree with the above.  Brendolyn Patty MD

## 2020-09-09 ENCOUNTER — Telehealth: Payer: Self-pay

## 2020-09-09 NOTE — Telephone Encounter (Signed)
Called pt no answer left vm. Pt does not need to fast for upcoming appt  Copied from [place] [id number]. Topic: General - Call Back - No Documentation >> Sep 09, 2020  1:38 PM Erick Blinks wrote: Reason for CRM: Pt wants to know if she needs to fast for upcoming lab work. Please advise

## 2020-09-13 ENCOUNTER — Ambulatory Visit: Payer: 59 | Admitting: Nurse Practitioner

## 2020-09-14 ENCOUNTER — Ambulatory Visit: Payer: 59 | Admitting: Nurse Practitioner

## 2020-09-27 ENCOUNTER — Telehealth: Payer: Self-pay

## 2020-09-27 NOTE — Telephone Encounter (Signed)
Patient left a voicemail stating she needed to reschedule her colonoscopy telephone call for 09/28/2020. Called patient and left a message for call back

## 2020-09-28 ENCOUNTER — Telehealth (INDEPENDENT_AMBULATORY_CARE_PROVIDER_SITE_OTHER): Payer: Self-pay | Admitting: Gastroenterology

## 2020-09-28 DIAGNOSIS — Z1211 Encounter for screening for malignant neoplasm of colon: Secondary | ICD-10-CM

## 2020-09-28 MED ORDER — NA SULFATE-K SULFATE-MG SULF 17.5-3.13-1.6 GM/177ML PO SOLN: 1.0000 | Freq: Once | ORAL | 0 refills | Status: AC

## 2020-09-28 NOTE — Progress Notes (Signed)
Gastroenterology Pre-Procedure Review  Request Date: 10/13/20 Requesting Physician: Dr. Vicente Males  PATIENT REVIEW QUESTIONS: The patient responded to the following health history questions as indicated:    1. Are you having any GI issues? no 2. Do you have a personal history of Polyps?  2012 No polyps removed 3. Do you have a family history of Colon Cancer or Polyps? no 4. Diabetes Mellitus? no 5. Joint replacements in the past 12 months?no 6. Major health problems in the past 3 months?no 7. Any artificial heart valves, MVP, or defibrillator?no    MEDICATIONS & ALLERGIES:    Patient reports the following regarding taking any anticoagulation/antiplatelet therapy:   Plavix, Coumadin, Eliquis, Xarelto, Lovenox, Pradaxa, Brilinta, or Effient? no Aspirin? yes (81 mg)  Patient confirms/reports the following medications:  Current Outpatient Medications  Medication Sig Dispense Refill   albuterol (VENTOLIN HFA) 108 (90 Base) MCG/ACT inhaler Inhale 2 puffs into the lungs every 6 (six) hours as needed for wheezing or shortness of breath. 18 g 1   amLODipine (NORVASC) 10 MG tablet Take 1 tablet (10 mg total) by mouth daily. 90 tablet 1   aspirin 81 MG EC tablet Take by mouth daily.      carvedilol (COREG) 3.125 MG tablet Take 3.125 mg by mouth 2 (two) times daily with a meal.     hydrALAZINE (APRESOLINE) 50 MG tablet Take 100 mg by mouth 2 (two) times daily.     hydrochlorothiazide (HYDRODIURIL) 25 MG tablet Take 1 tablet (25 mg total) by mouth daily. 90 tablet 1   potassium chloride (KLOR-CON) 20 MEQ packet Take 20 mEq by mouth daily.     pravastatin (PRAVACHOL) 40 MG tablet Take 1 tablet (40 mg total) by mouth daily. 90 tablet 1   sitaGLIPtin-metformin (JANUMET) 50-1000 MG tablet Take 1 tablet by mouth 2 (two) times daily with a meal. 180 tablet 1   No current facility-administered medications for this visit.    Patient confirms/reports the following allergies:  Allergies  Allergen Reactions    Sulfasalazine Hives   Elemental Sulfur Other (See Comments)   Metoprolol Other (See Comments)   Sulfa Antibiotics Hives   Lisinopril Cough    No orders of the defined types were placed in this encounter.   AUTHORIZATION INFORMATION Primary Insurance: 1D#: Group #:  Secondary Insurance: 1D#: Group #:  SCHEDULE INFORMATION: Date: 10/13/20 Time: Location: [place]

## 2020-10-05 ENCOUNTER — Encounter: Admit: 2020-10-05 | Payer: PRIVATE HEALTH INSURANCE | Attending: Adult Health | Primary: Family Medicine

## 2020-10-11 ENCOUNTER — Telehealth: Payer: Self-pay

## 2020-10-11 NOTE — Telephone Encounter (Signed)
Patient needs to cancel the procedure on 10/13/2020 with Dr. Vicente Males. She has a family emergency. Would like a call back to reschedule

## 2020-10-11 NOTE — Telephone Encounter (Signed)
Called patient back to reschedule her procedure and she stated that she was not ready to reschedule at this time and that she would reach out to [place] once she knew when she was able to proceed. I then called Trish from Endo unit to cancel her procedure.

## 2020-10-13 ENCOUNTER — Ambulatory Visit: Admission: RE | Admit: 2020-10-13 | Payer: 59 | Source: Home / Self Care | Admitting: Gastroenterology

## 2020-10-13 ENCOUNTER — Encounter: Admission: RE | Payer: Self-pay | Source: Home / Self Care

## 2020-10-13 SURGERY — COLONOSCOPY WITH PROPOFOL
Anesthesia: General

## 2020-10-28 ENCOUNTER — Encounter: Admit: 2020-10-28 | Payer: PRIVATE HEALTH INSURANCE | Attending: Adult Health | Primary: Family Medicine

## 2020-10-29 ENCOUNTER — Encounter: Admit: 2020-10-29 | Payer: PRIVATE HEALTH INSURANCE | Attending: Adult Health | Primary: Family Medicine

## 2020-11-02 ENCOUNTER — Other Ambulatory Visit: Payer: Self-pay | Admitting: Nurse Practitioner

## 2020-11-02 DIAGNOSIS — E1159 Type 2 diabetes mellitus with other circulatory complications: Secondary | ICD-10-CM

## 2020-11-02 DIAGNOSIS — I152 Hypertension secondary to endocrine disorders: Secondary | ICD-10-CM

## 2020-11-02 NOTE — Telephone Encounter (Signed)
Requested medications are due for refill today.  yes  Requested medications are on the active medications list.  yes  Last refill. 04/2020  Future visit scheduled.   no  Notes to clinic.  PCP listed is Dr.Crissman. Pt is right at 6 months from last visit.  Patient has missed some appointments.

## 2020-11-02 NOTE — Telephone Encounter (Signed)
Patient is overdue for appointment. Was last seen in February and was due for follow up in May. Please call to schedule.

## 2020-11-05 ENCOUNTER — Encounter: Admit: 2020-11-05 | Payer: PRIVATE HEALTH INSURANCE | Attending: Adult Health | Primary: Family Medicine

## 2020-11-07 NOTE — Progress Notes
Asked to refill KCL.  Declined script.  She has not been seen in 22 months, nearly 2 years.  She cancelled all testing and follow up with me. She has been left several messages over the past year to schedule a follow up.

## 2020-11-24 ENCOUNTER — Other Ambulatory Visit: Payer: Self-pay | Admitting: Nurse Practitioner

## 2020-11-24 DIAGNOSIS — E1165 Type 2 diabetes mellitus with hyperglycemia: Secondary | ICD-10-CM

## 2020-11-28 ENCOUNTER — Ambulatory Visit: Payer: Self-pay | Admitting: *Deleted

## 2020-11-28 NOTE — Telephone Encounter (Signed)
Pt called in c/o severe lower back pain.   Before agent could transfer her to me she either hung up or the line disconnected.  I called her back and left a voicemail to call [facility] back and left the number.

## 2020-11-30 NOTE — Progress Notes (Signed)
BP (!) 144/89 (BP Location: Left Arm, Cuff Size: Normal)   Pulse 78   Temp 98.3 F (36.8 C) (Oral)   Ht 5' 9.5" (1.765 m)   Wt 169 lb 6.4 oz (76.8 kg)   SpO2 98%   BMI 24.66 kg/m    Subjective:    Patient ID: Lacey Schaefer, female    DOB: 12-10-1960, 60 y.o.   MRN: [id number]  HPI: Lacey Schaefer is a 60 y.o. female  Chief Complaint  Patient presents with   Diabetes   Hypertension   HYPERTENSION / [place] Satisfied with current treatment? yes Duration of hypertension: years BP monitoring frequency: daily BP range: 120-130/70-80 BP medication side effects: no Past BP meds:  hydralazine, amlodipine, carvedilol, and HCTZ Duration of hyperlipidemia: years Cholesterol medication side effects: no Cholesterol supplements: none Past cholesterol medications: pravastatin (pravachol) Medication compliance: excellent compliance Aspirin: yes Recent stressors: no Recurrent headaches: no Visual changes: no Palpitations: no Dyspnea: no Chest pain: no Lower extremity edema: no Dizzy/lightheaded: no  DIABETES Hypoglycemic episodes:no Polydipsia/polyuria: no Visual disturbance: no Chest pain: no Paresthesias: no Glucose Monitoring: no  Accucheck frequency: TID  Fasting glucose:  Post prandial:  Evening:  Before meals: Taking Insulin?: no  Long acting insulin:  Short acting insulin: Blood Pressure Monitoring: daily Retinal Examination: Not up to Date Foot Exam: Up to Date Diabetic Education: Not Completed Pneumovax: Not up to Date Influenza: Not up to Date Aspirin: no  BACK PAIN- back pain is improved today.  Duration: days Mechanism of injury: no trauma Location: bilateral Onset: gradual Severity: severe Quality: sharp and aching Frequency: constant Radiation: buttocks Aggravating factors: lifting, movement, and bending Alleviating factors: nothing Status: better Treatments attempted:  tylenol and rest  Relief with NSAIDs?: No NSAIDs  Taken Nighttime pain:  no Paresthesias / decreased sensation:  no Bowel / bladder incontinence:  no Fevers:  no Dysuria / urinary frequency:  no  Patient states she is having some itching and burning of the  Relevant past medical, surgical, family and social history reviewed and updated as indicated. Interim medical history since our last visit reviewed. Allergies and medications reviewed and updated.  Review of Systems  Eyes:  Negative for visual disturbance.  Respiratory:  Negative for cough, chest tightness and shortness of breath.   Cardiovascular:  Negative for chest pain, palpitations and leg swelling.  Endocrine: Negative for polydipsia and polyuria.  Genitourinary:  Positive for dysuria.  Musculoskeletal:  Positive for back pain.  Neurological:  Negative for dizziness, numbness and headaches.   Per HPI unless specifically indicated above     Objective:    BP (!) 144/89 (BP Location: Left Arm, Cuff Size: Normal)   Pulse 78   Temp 98.3 F (36.8 C) (Oral)   Ht 5' 9.5" (1.765 m)   Wt 169 lb 6.4 oz (76.8 kg)   SpO2 98%   BMI 24.66 kg/m   Wt Readings from Last 3 Encounters:  12/01/20 169 lb 6.4 oz (76.8 kg)  04/22/20 185 lb 12.8 oz (84.3 kg)  01/18/20 184 lb 12.8 oz (83.8 kg)    Physical Exam Vitals and nursing note reviewed.  Constitutional:      General: She is not in acute distress.    Appearance: Normal appearance. She is normal weight. She is not ill-appearing, toxic-appearing or diaphoretic.  HENT:     Head: Normocephalic.     Right Ear: External ear normal.     Left Ear: External ear normal.     Nose:  Nose normal.     Mouth/Throat:     Mouth: Mucous membranes are moist.     Pharynx: Oropharynx is clear.  Eyes:     General:        Right eye: No discharge.        Left eye: No discharge.     Extraocular Movements: Extraocular movements intact.     Conjunctiva/sclera: Conjunctivae normal.     Pupils: Pupils are equal, round, and reactive to light.   Cardiovascular:     Rate and Rhythm: Normal rate and regular rhythm.     Heart sounds: No murmur heard. Pulmonary:     Effort: Pulmonary effort is normal. No respiratory distress.     Breath sounds: Normal breath sounds. No wheezing or rales.  Musculoskeletal:        General: No tenderness. Normal range of motion.     Cervical back: Normal range of motion and neck supple.     Right lower leg: No edema.     Left lower leg: No edema.  Skin:    General: Skin is warm and dry.     Capillary Refill: Capillary refill takes less than 2 seconds.  Neurological:     General: No focal deficit present.     Mental Status: She is alert and oriented to person, place, and time. Mental status is at baseline.  Psychiatric:        Mood and Affect: Mood normal.        Behavior: Behavior normal.        Thought Content: Thought content normal.        Judgment: Judgment normal.    Results for orders placed or performed in visit on 04/22/20  Comp Met (CMET)  Result Value Ref Range   Glucose 189 (H) 65 - 99 mg/dL   BUN 13 8 - 27 mg/dL   Creatinine, Ser 0.71 0.57 - 1.00 mg/dL   GFR calc non Af Amer 92 >59 mL/min/1.73   GFR calc Af Amer 106 >59 mL/min/1.73   BUN/Creatinine Ratio 18 12 - 28   Sodium 141 134 - 144 mmol/L   Potassium 3.2 (L) 3.5 - 5.2 mmol/L   Chloride 102 96 - 106 mmol/L   CO2 26 20 - 29 mmol/L   Calcium 9.4 8.7 - 10.3 mg/dL   Total Protein 6.5 6.0 - 8.5 g/dL   Albumin 4.2 3.8 - 4.8 g/dL   Globulin, Total 2.3 1.5 - 4.5 g/dL   Albumin/Globulin Ratio 1.8 1.2 - 2.2   Bilirubin Total 0.3 0.0 - 1.2 mg/dL   Alkaline Phosphatase 62 44 - 121 IU/L   AST 15 0 - 40 IU/L   ALT 19 0 - 32 IU/L  Bayer DCA Hb A1c Waived  Result Value Ref Range   HB A1C (BAYER DCA - WAIVED) 7.6 (H) <7.0 %      Assessment & Plan:   Problem List Items Addressed This Visit       Cardiovascular and Mediastinum   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  Controlled.  Elevated at  visit today but well controlled at home.  Continue with current medication regimen.  Labs ordered today.  Return to clinic in 6 months for reevaluation.  Call sooner if concerns arise.        Relevant Orders   Comp Met (CMET)     Endocrine   Type 2 diabetes mellitus with hyperglycemia (HCC)    Chronic.  Controlled.  Continue  with current medication regimen.  Labs ordered today.  Return to clinic in 6 months for reevaluation.  Call sooner if concerns arise.  Patient states the Janumet is upsetting her stomach.  She is taking it daily most days and sometimes BID. States she tolerates the [place] but not the metformin.  Will wait for labs to come back and make further recommendations.       Relevant Orders   HgB A1c   Hyperlipidemia associated with type 2 diabetes mellitus ([place])   Relevant Orders   Lipid Profile     Other   Chronic bilateral low back pain without sciatica    Referral placed for patient to see physical therapy for further evaluation and management.       Relevant Orders   Ambulatory referral to Physical Therapy   Other Visit Diagnoses     Encounter for screening mammogram for malignant neoplasm of breast       Relevant Orders   MM 3D SCREEN BREAST BILATERAL   Dysuria       UA obtained during visit today. Will send for culture if necessary and treat with antibiotics.    Relevant Orders   Urinalysis, Routine w reflex microscopic        Follow up plan: Return in about 6 months (around 05/31/2021) for Physical and Fasting labs.

## 2020-12-01 ENCOUNTER — Ambulatory Visit (INDEPENDENT_AMBULATORY_CARE_PROVIDER_SITE_OTHER): Payer: 59 | Admitting: Nurse Practitioner

## 2020-12-01 ENCOUNTER — Encounter: Payer: Self-pay | Admitting: Nurse Practitioner

## 2020-12-01 ENCOUNTER — Other Ambulatory Visit: Payer: Self-pay

## 2020-12-01 VITALS — BP 144/89 | HR 78 | Temp 98.3°F | Ht 69.5 in | Wt 169.4 lb

## 2020-12-01 DIAGNOSIS — E1169 Type 2 diabetes mellitus with other specified complication: Secondary | ICD-10-CM | POA: Diagnosis not present

## 2020-12-01 DIAGNOSIS — E1159 Type 2 diabetes mellitus with other circulatory complications: Secondary | ICD-10-CM

## 2020-12-01 DIAGNOSIS — E785 Hyperlipidemia, unspecified: Secondary | ICD-10-CM

## 2020-12-01 DIAGNOSIS — R8290 Unspecified abnormal findings in urine: Secondary | ICD-10-CM

## 2020-12-01 DIAGNOSIS — M545 Low back pain, unspecified: Secondary | ICD-10-CM

## 2020-12-01 DIAGNOSIS — R300 Dysuria: Secondary | ICD-10-CM

## 2020-12-01 DIAGNOSIS — Z1231 Encounter for screening mammogram for malignant neoplasm of breast: Secondary | ICD-10-CM

## 2020-12-01 DIAGNOSIS — G8929 Other chronic pain: Secondary | ICD-10-CM

## 2020-12-01 DIAGNOSIS — E1165 Type 2 diabetes mellitus with hyperglycemia: Secondary | ICD-10-CM

## 2020-12-01 DIAGNOSIS — I152 Hypertension secondary to endocrine disorders: Secondary | ICD-10-CM

## 2020-12-01 LAB — URINALYSIS, ROUTINE W REFLEX MICROSCOPIC
Bilirubin, UA: NEGATIVE
Glucose, UA: NEGATIVE
Ketones, UA: NEGATIVE
Nitrite, UA: NEGATIVE
RBC, UA: NEGATIVE
Specific Gravity, UA: 1.025 (ref 1.005–1.030)
Urobilinogen, Ur: 0.2 mg/dL (ref 0.2–1.0)
pH, UA: 5.5 (ref 5.0–7.5)

## 2020-12-01 LAB — MICROSCOPIC EXAMINATION

## 2020-12-01 MED ORDER — NITROFURANTOIN MONOHYD MACRO 100 MG PO CAPS: 100.0000 mg | ORAL_CAPSULE | Freq: Two times a day (BID) | ORAL | 0 refills | Status: DC

## 2020-12-01 NOTE — Assessment & Plan Note (Signed)
Chronic.  Controlled.  Elevated at visit today but well controlled at home.  Continue with current medication regimen.  Labs ordered today.  Return to clinic in 6 months for reevaluation.  Call sooner if concerns arise.

## 2020-12-01 NOTE — Progress Notes (Signed)
Called and spoke with patient to inform her of results. Urine sent for culture. Macrobid sent to the pharmacy.

## 2020-12-01 NOTE — Addendum Note (Signed)
Addended by: Jon Billings on: 12/01/2020 02:07 PM   Modules accepted: Orders

## 2020-12-01 NOTE — Assessment & Plan Note (Signed)
Chronic.  Controlled.  Continue with current medication regimen.  Labs ordered today.  Return to clinic in 6 months for reevaluation.  Call sooner if concerns arise.  Patient states the Janumet is upsetting her stomach.  She is taking it daily most days and sometimes BID. States she tolerates the [place] but not the metformin.  Will wait for labs to come back and make further recommendations.

## 2020-12-01 NOTE — Assessment & Plan Note (Signed)
Referral placed for patient to see physical therapy for further evaluation and management.

## 2020-12-02 LAB — COMPREHENSIVE METABOLIC PANEL
ALT: 14 IU/L (ref 0–32)
AST: 14 IU/L (ref 0–40)
Albumin/Globulin Ratio: 1.5 (ref 1.2–2.2)
Albumin: 4 g/dL (ref 3.8–4.8)
Alkaline Phosphatase: 85 IU/L (ref 44–121)
BUN/Creatinine Ratio: 15 (ref 12–28)
BUN: 11 mg/dL (ref 8–27)
Bilirubin Total: 0.3 mg/dL (ref 0.0–1.2)
CO2: 24 mmol/L (ref 20–29)
Calcium: 9.5 mg/dL (ref 8.7–10.3)
Chloride: 101 mmol/L (ref 96–106)
Creatinine, Ser: 0.74 mg/dL (ref 0.57–1.00)
Globulin, Total: 2.7 g/dL (ref 1.5–4.5)
Glucose: 139 mg/dL — ABNORMAL HIGH (ref 65–99)
Potassium: 3.5 mmol/L (ref 3.5–5.2)
Sodium: 140 mmol/L (ref 134–144)
Total Protein: 6.7 g/dL (ref 6.0–8.5)
eGFR: 92 mL/min/{1.73_m2} (ref >59)

## 2020-12-02 LAB — HEMOGLOBIN A1C
Est. average glucose Bld gHb Est-mCnc: 189 mg/dL
Hgb A1c MFr Bld: 8.2 % — ABNORMAL HIGH (ref 4.8–5.6)

## 2020-12-02 LAB — LIPID PANEL
Chol/HDL Ratio: 3.2 ratio (ref 0.0–4.4)
Cholesterol, Total: 201 mg/dL — ABNORMAL HIGH (ref 100–199)
HDL: 62 mg/dL (ref >39)
LDL Chol Calc (NIH): 120 mg/dL — ABNORMAL HIGH (ref 0–99)
Triglycerides: 106 mg/dL (ref 0–149)
VLDL Cholesterol Cal: 19 mg/dL (ref 5–40)

## 2020-12-02 NOTE — Progress Notes (Signed)
Please let patient know that her lab work shows that her A1c is still elevated.  Since she is not tolerating the metformin well I will change the medication to Januvia only then I would like to add Wilder Glade which is another oral medication that will help control patient's A1c.  If patient agrees I will send these into the pharmacy for her.  Please let me know if she has any questions.

## 2020-12-05 ENCOUNTER — Telehealth: Payer: Self-pay | Admitting: Nurse Practitioner

## 2020-12-05 NOTE — Telephone Encounter (Incomplete Revision)
Pt states yes, she is considering adding the new medication to control her A1C.  However, pt wants to know is that daily, and what mg will that be?  Will this be one pill or two daily?

## 2020-12-05 NOTE — Telephone Encounter (Addendum)
Pt states yes, she is considering adding the new medication to control her A1C.  However, pt wants to know is that daily, and what mg will that be?  Will this be one pill or two daily?

## 2020-12-06 LAB — URINE CULTURE

## 2020-12-07 NOTE — Telephone Encounter (Signed)
Called and LVM asking for patient to please return my call.  

## 2020-12-07 NOTE — Telephone Encounter (Signed)
Januvia would be 100mg  daily.  The farxiga would 10mg  daily.  She would take both of them everyday.

## 2020-12-08 NOTE — Telephone Encounter (Signed)
Patient returned my call. States that she does not want to start any new medications right now.

## 2020-12-08 NOTE — Telephone Encounter (Signed)
Called and LVM asking for patient to please return my call.  

## 2020-12-15 ENCOUNTER — Ambulatory Visit: Payer: 59 | Admitting: Nurse Practitioner

## 2020-12-16 ENCOUNTER — Other Ambulatory Visit: Payer: Self-pay | Admitting: Nurse Practitioner

## 2020-12-16 DIAGNOSIS — E1165 Type 2 diabetes mellitus with hyperglycemia: Secondary | ICD-10-CM

## 2020-12-17 NOTE — Telephone Encounter (Signed)
Requested medications are due for refill today requesting early  Requested medications are on the active medication list yes  Last refill 11/25/20  Last visit 12/01/20  Future visit scheduled 06/01/21  Notes to clinic requesting med 9 days early, please assess.

## 2020-12-19 ENCOUNTER — Ambulatory Visit: Payer: 59 | Admitting: Physical Therapy

## 2020-12-20 ENCOUNTER — Telehealth: Payer: Self-pay

## 2020-12-20 NOTE — Telephone Encounter (Signed)
Pt. Ready to reschedule colonoscopy

## 2020-12-21 ENCOUNTER — Telehealth: Payer: Self-pay

## 2020-12-21 ENCOUNTER — Ambulatory Visit: Payer: 59 | Admitting: Physical Therapy

## 2020-12-21 NOTE — Telephone Encounter (Signed)
Called patient back no answer left voicemail for a return call

## 2020-12-21 NOTE — Telephone Encounter (Signed)
CALLED PATIENT NO ANSWER LEFT VOICEMAIL FOR A CALL BACK ? ?

## 2020-12-21 NOTE — Telephone Encounter (Signed)
PT RETURNING CALL.

## 2020-12-26 ENCOUNTER — Encounter: Payer: 59 | Admitting: Physical Therapy

## 2020-12-28 ENCOUNTER — Ambulatory Visit
Admission: RE | Admit: 2020-12-28 | Discharge: 2020-12-28 | Disposition: A | Discharge status: Home / Self Care | Payer: 59 | Source: Ambulatory Visit | Attending: Nurse Practitioner | Admitting: Nurse Practitioner

## 2020-12-28 ENCOUNTER — Other Ambulatory Visit: Payer: Self-pay

## 2020-12-28 DIAGNOSIS — Z1231 Encounter for screening mammogram for malignant neoplasm of breast: Secondary | ICD-10-CM | POA: Diagnosis not present

## 2020-12-29 ENCOUNTER — Telehealth: Payer: Self-pay | Admitting: Nurse Practitioner

## 2020-12-29 ENCOUNTER — Encounter: Payer: Self-pay | Admitting: Physical Therapy

## 2020-12-29 ENCOUNTER — Encounter: Payer: 59 | Admitting: Physical Therapy

## 2020-12-29 DIAGNOSIS — Z1211 Encounter for screening for malignant neoplasm of colon: Secondary | ICD-10-CM

## 2020-12-29 NOTE — Telephone Encounter (Signed)
Copied from [place] [id number]. Topic: Referral - Question >> Dec 29, 2020  3:37 PM Pawlus, Brayton Layman A wrote: Reason for CRM: Pt stated she needs a new referral sent in to [facility] to get her colonoscopy. Pt stated she missed her appt back in July. Please advise.

## 2021-01-02 ENCOUNTER — Encounter: Payer: 59 | Admitting: Physical Therapy

## 2021-01-04 ENCOUNTER — Ambulatory Visit: Payer: 59

## 2021-01-04 ENCOUNTER — Telehealth: Payer: Self-pay

## 2021-01-04 NOTE — Telephone Encounter (Signed)
Pt. Calling to schedule colonoscopy 

## 2021-01-04 NOTE — Telephone Encounter (Signed)
Returned patients call. LVM to call office back. 

## 2021-01-05 ENCOUNTER — Encounter: Payer: 59 | Admitting: Physical Therapy

## 2021-01-06 ENCOUNTER — Other Ambulatory Visit: Payer: Self-pay | Admitting: Nurse Practitioner

## 2021-01-06 NOTE — Telephone Encounter (Signed)
[facility] called and spoke to [place], [employer]chant [employer] officer about the patient's refill requests. She says as far as the Janumet, the patient called and wants them to refill and they are waiting on a fax from [facility] where the refill is currently so they can refill. I asked will they split it up in 30 day increments because the patient says she can only afford 30 days at a time. She says they will be able to do that for her. [facility] for Potassium.

## 2021-01-06 NOTE — Telephone Encounter (Signed)
Copied from [place] [id number]. Topic: Quick Communication - Rx Refill/Question >> Jan 06, 2021  3:44 PM Yvette Rack wrote: Pt stated she can only afford to get a 1 month supply of her medications due to insurance and cost.  Medication: JANUMET 50-1000 MG tablet and potassium chloride (KLOR-CON) 20 MEQ packet  Has the patient contacted their pharmacy? Yes.   (Agent: If no, request that the patient contact the pharmacy for the refill. If patient does not wish to contact the pharmacy document the reason why and proceed with request.) (Agent: If yes, when and what did the pharmacy advise?)  Preferred Pharmacy (with phone number or street name):  Has the patient been seen for an appointment in the last year OR does the patient have an upcoming appointment? Yes.    Agent: Please be advised that RX refills may take up to 3 business days. We ask that you follow-up with your pharmacy.

## 2021-01-07 NOTE — Telephone Encounter (Signed)
Requested medications are on the active medication list yes  Last visit 12/01/20  Future visit scheduled 06/01/21  Notes to clinic Historical Provider, please assess. Requested Prescriptions  Pending Prescriptions Disp Refills   potassium chloride (KLOR-CON) 20 MEQ packet      Sig: Take 20 mEq by mouth daily.     Endocrinology:  Minerals - Potassium Supplementation Passed - 01/06/2021  4:16 PM      Passed - K in normal range and within 360 days    Potassium  Date Value Ref Range Status  12/01/2020 3.5 3.5 - 5.2 mmol/L Final          Passed - Cr in normal range and within 360 days    Creatinine, Ser  Date Value Ref Range Status  12/01/2020 0.74 0.57 - 1.00 mg/dL Final          Passed - Valid encounter within last 12 months    Recent Outpatient Visits           1 month ago Hypertension associated with diabetes [facility] [address])   [facility] Jon Billings, NP   8 months ago Hypertension associated with diabetes [facility])   [facility] Jon Billings, NP   11 months ago Hypertension associated with diabetes [facility])   [place], [place], DO   1 year ago [place], Rachel [place], [place]   1 year ago Hypertension associated with diabetes [facility])   [facility] Volney American, [place]       Future Appointments             In 4 months Jon Billings, NP [facility], [place]

## 2021-01-09 ENCOUNTER — Encounter: Payer: 59 | Admitting: Physical Therapy

## 2021-01-12 ENCOUNTER — Encounter: Payer: 59 | Admitting: Physical Therapy

## 2021-01-16 ENCOUNTER — Other Ambulatory Visit: Payer: Self-pay | Admitting: Internal Medicine

## 2021-01-16 DIAGNOSIS — I208 Other forms of angina pectoris: Secondary | ICD-10-CM

## 2021-01-16 NOTE — Progress Notes (Signed)
Please let patient know her Mammogram did not show any evidence of a malignancy.  The recommendation is to repeat the Mammogram in 1 year.  

## 2021-01-17 ENCOUNTER — Encounter: Payer: 59 | Admitting: Physical Therapy

## 2021-01-19 ENCOUNTER — Ambulatory Visit: Payer: Self-pay | Admitting: Nurse Practitioner

## 2021-01-19 ENCOUNTER — Encounter: Payer: 59 | Admitting: Physical Therapy

## 2021-01-24 ENCOUNTER — Encounter: Payer: 59 | Admitting: Physical Therapy

## 2021-01-26 ENCOUNTER — Encounter: Payer: 59 | Admitting: Physical Therapy

## 2021-01-30 ENCOUNTER — Encounter: Payer: Self-pay | Admitting: Physical Therapy

## 2021-02-01 ENCOUNTER — Encounter: Payer: Self-pay | Admitting: Physical Therapy

## 2021-02-06 ENCOUNTER — Encounter: Payer: Self-pay | Admitting: Physical Therapy

## 2021-02-07 ENCOUNTER — Other Ambulatory Visit: Payer: Self-pay | Admitting: Nurse Practitioner

## 2021-02-07 DIAGNOSIS — E785 Hyperlipidemia, unspecified: Secondary | ICD-10-CM

## 2021-02-07 DIAGNOSIS — E1169 Type 2 diabetes mellitus with other specified complication: Secondary | ICD-10-CM

## 2021-02-07 NOTE — Telephone Encounter (Signed)
Requested Prescriptions  Pending Prescriptions Disp Refills  . pravastatin (PRAVACHOL) 40 MG tablet [Pharmacy Med Name: PRAVASTATIN SODIUM 40 MG TAB] 90 tablet 1    Sig: TAKE 1 TABLET BY MOUTH EVERY DAY     Cardiovascular:  Antilipid - Statins Failed - 02/07/2021  1:23 AM      Failed - Total Cholesterol in normal range and within 360 days    Cholesterol, Total  Date Value Ref Range Status  12/01/2020 201 (H) 100 - 199 mg/dL Final         Failed - LDL in normal range and within 360 days    LDL Chol Calc (NIH)  Date Value Ref Range Status  12/01/2020 120 (H) 0 - 99 mg/dL Final         Passed - HDL in normal range and within 360 days    HDL  Date Value Ref Range Status  12/01/2020 62 >39 mg/dL Final         Passed - Triglycerides in normal range and within 360 days    Triglycerides  Date Value Ref Range Status  12/01/2020 106 0 - 149 mg/dL Final         Passed - Patient is not pregnant      Passed - Valid encounter within last 12 months    Recent Outpatient Visits          2 months ago Hypertension associated with diabetes ([place])   [facility] Jon Billings, NP   9 months ago Hypertension associated with diabetes ([place])   [facility] Jon Billings, NP   1 year ago Hypertension associated with diabetes [facility])   [place], [place], DO   1 year ago [place], [place], [place]   1 year ago Hypertension associated with diabetes [facility])   [facility] Volney American, [place]      Future Appointments            In 3 months Jon Billings, NP [facility], [place]

## 2021-02-08 ENCOUNTER — Ambulatory Visit: Admission: RE | Admit: 2021-02-08 | Payer: 59 | Source: Ambulatory Visit

## 2021-02-08 ENCOUNTER — Encounter: Payer: Self-pay | Admitting: Physical Therapy

## 2021-02-13 ENCOUNTER — Encounter: Payer: Self-pay | Admitting: Physical Therapy

## 2021-02-15 ENCOUNTER — Ambulatory Visit: Payer: Self-pay | Admitting: *Deleted

## 2021-02-15 ENCOUNTER — Encounter: Payer: Self-pay | Admitting: Physical Therapy

## 2021-02-15 NOTE — Telephone Encounter (Signed)
Pt states back pain for several months, has upcoming appt but requesting appt tomorrow. Reports episode of dizziness this AM, spinning sensation while lying in bed, duration few minutes "Then felt very weak for about 10 minutes." No further episodes. States BP 120/70.States back pain intermittent, occurs daily, "Left side towards lung part but at times all over my back." Left side mostly when lifting. Assured pt NT would route to practice for review, no availability tomorrow. Pt states she will see any provider.  Please advise: [phone]       Reason for Disposition  [1] NO dizziness now AND [2] age > 89  Answer Assessment - Initial Assessment Questions 1. DESCRIPTION: "Describe your dizziness."     Spinning 2. VERTIGO: "Do you feel like either you or the room is spinning or tilting?"      This AM, lying down. Then a bit weak for about 5-10 minutes 3. LIGHTHEADED: "Do you feel lightheaded?" (e.g., somewhat faint, woozy, weak upon standing)     Not presently 4. SEVERITY: "How bad is it?"  "Can you walk?"   - MILD: Feels slightly dizzy and unsteady, but is walking normally.   - MODERATE: Feels unsteady when walking, but not falling; interferes with normal activities (e.g., school, work).   - SEVERE: Unable to walk without falling, or requires assistance to walk without falling.     mild 5. ONSET:  "When did the dizziness begin?"     One episode this AM 6. AGGRAVATING FACTORS: "Does anything make it worse?" (e.g., standing, change in head position)     No. Happened lying down 7. CAUSE: "What do you think is causing the dizziness?"     no 8. RECURRENT SYMPTOM: "Have you had dizziness before?" If Yes, ask: "When was the last time?" "What happened that time?"     no 9. OTHER SYMPTOMS: "Do you have any other symptoms?" (e.g., headache, weakness, numbness, vomiting, earache)     Back pain,"left side towards lung part. Sometimes all over though. Left side with lifting."  Comes and goes,  daily  Protocols used: Dizziness - Vertigo-A-AH

## 2021-02-16 NOTE — Telephone Encounter (Signed)
Please schedule an appt for tomorrow.  See if she can come at 8am

## 2021-02-16 NOTE — Telephone Encounter (Signed)
Routing to provider to advise.  

## 2021-02-17 NOTE — Telephone Encounter (Signed)
Called pt to schedule appt today. Lmom asking pt to call back to come in.

## 2021-02-17 NOTE — Telephone Encounter (Signed)
Pt states she will keep her appt on 12/6

## 2021-02-21 ENCOUNTER — Other Ambulatory Visit: Payer: Self-pay

## 2021-02-21 ENCOUNTER — Ambulatory Visit (INDEPENDENT_AMBULATORY_CARE_PROVIDER_SITE_OTHER): Payer: Self-pay | Admitting: Nurse Practitioner

## 2021-02-21 ENCOUNTER — Encounter: Payer: Self-pay | Admitting: Nurse Practitioner

## 2021-02-21 VITALS — BP 127/78 | HR 80 | Temp 98.2°F | Ht 63.0 in | Wt 183.0 lb

## 2021-02-21 DIAGNOSIS — M546 Pain in thoracic spine: Secondary | ICD-10-CM

## 2021-02-21 DIAGNOSIS — R42 Dizziness and giddiness: Secondary | ICD-10-CM

## 2021-02-21 DIAGNOSIS — G43709 Chronic migraine without aura, not intractable, without status migrainosus: Secondary | ICD-10-CM

## 2021-02-21 MED ORDER — BUTALBITAL-APAP-CAFFEINE 50-325-40 MG PO TABS: 1.0000 | ORAL_TABLET | Freq: Four times a day (QID) | ORAL | 0 refills | Status: DC | PRN

## 2021-02-21 MED ORDER — METHYLPREDNISOLONE 4 MG PO TBPK: ORAL_TABLET | ORAL | 0 refills | Status: DC

## 2021-02-21 NOTE — Patient Instructions (Signed)
Zyrtec over the counter

## 2021-02-21 NOTE — Assessment & Plan Note (Signed)
Chronic. Worse at the present time. Previously on Fioricet. Will prescribe for patient today. Side effects and benefits of medication discussed during visit.  Discussed how to properly use.  Will reevaluate at next visit.

## 2021-02-21 NOTE — Progress Notes (Signed)
BP 127/78   Pulse 80   Temp 98.2 F (36.8 C) (Oral)   Ht _0  (1.6 m)   Wt 183 lb (83 kg)   SpO2 98%   BMI 32.42 kg/m    Subjective:    Patient ID: Lacey Schaefer, female    DOB: 1961/01/19, 60 y.o.   MRN: [id number]  HPI: Lacey Schaefer is a 60 y.o. female  Chief Complaint  Patient presents with   Headache   Back Pain   Dizziness    Dizziness comes and goes but pain along with headaches are connected.    DIZZINESS Duration: weeks Description of symptoms: room spinning Duration of episode: minutes had two episodes this morning when she was in bed and one on Saturday.  Dizziness frequency: no history of the same Provoking factors:  states it just comes randomly Aggravating factors:   not sure Triggered by rolling over in bed: yes Triggered by bending over: yes Aggravated by head movement: yes Aggravated by exertion, coughing, loud noises: no Recent head injury: no Recent or current viral symptoms: no History of vasovagal episodes: no Nausea: yes Vomiting: no Tinnitus: no Hearing loss: no Aural fullness: yes Headache: yes Photophobia/phonophobia: yes Unsteady gait: yes Postural instability: yes Diplopia, dysarthria, dysphagia or weakness: no Related to exertion: no Pallor: no Diaphoresis: no Dyspnea: no Chest pain: no  BACK PAIN Patient states she has been having back pain in the thoracic area and changes from left to right.  She feels like it may be a pulled muscle.  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   HEADACHES Patient states she has been experiencing more frequent headaches.  States she has suffered from migraines for many years.  When she was in [place] she was on Fioricet.  She would like to restart this medication to help with symptoms.   Relevant past medical, surgical, family and social history reviewed and updated as indicated. Interim medical history since our last visit reviewed. Allergies and medications  reviewed and updated.  Review of Systems  HENT:  Positive for congestion.   Musculoskeletal:  Positive for back pain.  Neurological:  Positive for dizziness and headaches.   Per HPI unless specifically indicated above     Objective:    BP 127/78   Pulse 80   Temp 98.2 F (36.8 C) (Oral)   Ht _1  (1.6 m)   Wt 183 lb (83 kg)   SpO2 98%   BMI 32.42 kg/m   Wt Readings from Last 3 Encounters:  02/21/21 183 lb (83 kg)  12/01/20 169 lb 6.4 oz (76.8 kg)  04/22/20 185 lb 12.8 oz (84.3 kg)    Physical Exam Vitals and nursing note reviewed.  Constitutional:      General: She is not in acute distress.    Appearance: Normal appearance. She is normal weight. She is not ill-appearing, toxic-appearing or diaphoretic.  HENT:     Head: Normocephalic.     Right Ear: External ear normal. A middle ear effusion is present. Tympanic membrane is not erythematous.     Left Ear: External ear normal. A middle ear effusion is present. Tympanic membrane is not erythematous.     Nose: Nose normal.     Mouth/Throat:     Mouth: Mucous membranes are moist.     Pharynx: Oropharynx is clear.  Eyes:     General: No visual field deficit.       Right eye: No discharge.  Left eye: No discharge.     Extraocular Movements: Extraocular movements intact.     Conjunctiva/sclera: Conjunctivae normal.     Pupils: Pupils are equal, round, and reactive to light.  Cardiovascular:     Rate and Rhythm: Normal rate and regular rhythm.     Heart sounds: No murmur heard. Pulmonary:     Effort: Pulmonary effort is normal. No respiratory distress.     Breath sounds: Normal breath sounds. No wheezing or rales.  Musculoskeletal:     Cervical back: Normal range of motion and neck supple.  Skin:    General: Skin is warm and dry.     Capillary Refill: Capillary refill takes less than 2 seconds.  Neurological:     General: No focal deficit present.     Mental Status: She is alert and oriented to person, place,  and time. Mental status is at baseline.     Cranial Nerves: No cranial nerve deficit or facial asymmetry.     Motor: Motor function is intact.     Coordination: Coordination is intact.     Gait: Gait is intact.  Psychiatric:        Mood and Affect: Mood normal.        Behavior: Behavior normal.        Thought Content: Thought content normal.        Judgment: Judgment normal.    Results for orders placed or performed in visit on 12/01/20  Microscopic Examination   Urine  Result Value Ref Range   WBC, UA 0-5 0 - 5 /hpf   RBC 0-2 0 - 2 /hpf   Epithelial Cells (non renal) 0-10 0 - 10 /hpf   Mucus, UA Present (A) Not Estab.   Bacteria, UA Few (A) None seen/Few  Urine Culture   Specimen: Urine   UR  Result Value Ref Range   Urine Culture, Routine Final report    Organism ID, Bacteria Comment   Comp Met (CMET)  Result Value Ref Range   Glucose 139 (H) 65 - 99 mg/dL   BUN 11 8 - 27 mg/dL   Creatinine, Ser 0.74 0.57 - 1.00 mg/dL   eGFR 92 >59 mL/min/1.73   BUN/Creatinine Ratio 15 12 - 28   Sodium 140 134 - 144 mmol/L   Potassium 3.5 3.5 - 5.2 mmol/L   Chloride 101 96 - 106 mmol/L   CO2 24 20 - 29 mmol/L   Calcium 9.5 8.7 - 10.3 mg/dL   Total Protein 6.7 6.0 - 8.5 g/dL   Albumin 4.0 3.8 - 4.8 g/dL   Globulin, Total 2.7 1.5 - 4.5 g/dL   Albumin/Globulin Ratio 1.5 1.2 - 2.2   Bilirubin Total 0.3 0.0 - 1.2 mg/dL   Alkaline Phosphatase 85 44 - 121 IU/L   AST 14 0 - 40 IU/L   ALT 14 0 - 32 IU/L  Lipid Profile  Result Value Ref Range   Cholesterol, Total 201 (H) 100 - 199 mg/dL   Triglycerides 106 0 - 149 mg/dL   HDL 62 >39 mg/dL   VLDL Cholesterol Cal 19 5 - 40 mg/dL   LDL Chol Calc (NIH) 120 (H) 0 - 99 mg/dL   Chol/HDL Ratio 3.2 0.0 - 4.4 ratio  HgB A1c  Result Value Ref Range   Hgb A1c MFr Bld 8.2 (H) 4.8 - 5.6 %   Est. average glucose Bld gHb Est-mCnc 189 mg/dL  Urinalysis, Routine w reflex microscopic  Result Value Ref Range   Specific  Gravity, UA 1.025 1.005 - 1.030    pH, UA 5.5 5.0 - 7.5   Color, UA Yellow Yellow   Appearance Ur Cloudy (A) Clear   Leukocytes,UA Trace (A) Negative   Protein,UA 1+ (A) Negative/Trace   Glucose, UA Negative Negative   Ketones, UA Negative Negative   RBC, UA Negative Negative   Bilirubin, UA Negative Negative   Urobilinogen, Ur 0.2 0.2 - 1.0 mg/dL   Nitrite, UA Negative Negative   Microscopic Examination See below:       Assessment & Plan:   Problem List Items Addressed This Visit       Cardiovascular and Mediastinum   Chronic migraine without aura without status migrainosus, not intractable    Chronic. Worse at the present time. Previously on Fioricet. Will prescribe for patient today. Side effects and benefits of medication discussed during visit.  Discussed how to properly use.  Will reevaluate at next visit.        Relevant Medications   methylPREDNISolone (MEDROL DOSEPAK) 4 MG TBPK tablet   butalbital-acetaminophen-caffeine (FIORICET) 50-325-40 MG tablet   Other Visit Diagnoses     Acute left-sided thoracic back pain    -  Primary   Medrol dose pak prescribed for back pain and dizziness. Recommend OTC tylenol/ibuprofen for symptom management. If symptoms do not improve can do imaging.   Relevant Medications   methylPREDNISolone (MEDROL DOSEPAK) 4 MG TBPK tablet   butalbital-acetaminophen-caffeine (FIORICET) 50-325-40 MG tablet   Dizziness       Pos for middle ear effusion. Has been having congestion. Will treat with OTC zyrtec and medrol dose pak. If symptoms do not improve can do further work up.        Follow up plan: Return if symptoms worsen or fail to improve.

## 2021-03-25 ENCOUNTER — Other Ambulatory Visit: Payer: Self-pay | Admitting: Family Medicine

## 2021-03-25 DIAGNOSIS — E1159 Type 2 diabetes mellitus with other circulatory complications: Secondary | ICD-10-CM

## 2021-03-25 NOTE — Telephone Encounter (Signed)
Requested Prescriptions  Pending Prescriptions Disp Refills   hydrochlorothiazide (HYDRODIURIL) 25 MG tablet [Pharmacy Med Name: hydroCHLOROthiazide 25 MG Oral Tablet] 90 tablet 0    Sig: Take 1 tablet by mouth once daily     Cardiovascular: Diuretics - Thiazide Passed - 03/25/2021  4:01 AM      Passed - Ca in normal range and within 360 days    Calcium  Date Value Ref Range Status  12/01/2020 9.5 8.7 - 10.3 mg/dL Final         Passed - Cr in normal range and within 360 days    Creatinine, Ser  Date Value Ref Range Status  12/01/2020 0.74 0.57 - 1.00 mg/dL Final         Passed - K in normal range and within 360 days    Potassium  Date Value Ref Range Status  12/01/2020 3.5 3.5 - 5.2 mmol/L Final         Passed - Na in normal range and within 360 days    Sodium  Date Value Ref Range Status  12/01/2020 140 134 - 144 mmol/L Final         Passed - Last BP in normal range    BP Readings from Last 1 Encounters:  02/21/21 127/78         Passed - Valid encounter within last 6 months    Recent Outpatient Visits          1 month ago Acute left-sided thoracic back pain   [place], NP   3 months ago Hypertension associated with diabetes [facility])   [facility] Jon Billings, NP   11 months ago Hypertension associated with diabetes [facility])   [facility] Jon Billings, NP   1 year ago Hypertension associated with diabetes [facility]   [place], [place], DO   1 year ago Rash   [facility], [place]      Future Appointments            In 2 months Jon Billings, NP [facility], [place]

## 2021-03-27 ENCOUNTER — Telehealth: Payer: Self-pay | Admitting: Nurse Practitioner

## 2021-03-27 NOTE — Telephone Encounter (Signed)
PT called saying [facility] told her that Jon Billings would have to have ins do a PA on her Prescription for Janumet .   She has new Haematologist.  She does not have the card.

## 2021-03-28 ENCOUNTER — Other Ambulatory Visit: Payer: Self-pay | Admitting: Nurse Practitioner

## 2021-03-28 DIAGNOSIS — E1165 Type 2 diabetes mellitus with hyperglycemia: Secondary | ICD-10-CM

## 2021-03-28 NOTE — Telephone Encounter (Signed)
Requested medications are due for refill today.  yes  Requested medications are on the active medications list.  yes  Last refill. 12/19/2020 #180/1  Future visit scheduled.   yes  Notes to clinic.  Pharmacy requesting an alternate medication - Needs PA    Requested Prescriptions  Pending Prescriptions Disp Refills   JANUMET 50-1000 MG tablet [Pharmacy Med Name: JANUMET 50-1,000 MG TABLET] 180 tablet 1    Sig: TAKE 1 TABLET BY MOUTH TWICE A DAY WITH MEALS     Endocrinology:  Diabetes - Biguanide + DPP-4 Inhibitor Combos Failed - 03/28/2021  7:13 PM      Failed - HBA1C is between 0 and 7.9 and within 180 days    HB A1C (BAYER DCA - WAIVED)  Date Value Ref Range Status  04/22/2020 7.6 (H) <7.0 % Final    Comment:                                          Diabetic Adult            <7.0                                       Healthy Adult        4.3 - 5.7                                                           (DCCT/NGSP) American Diabetes Association's Summary of Glycemic Recommendations for Adults with Diabetes: Hemoglobin A1c <7.0%. More stringent glycemic goals (A1c <6.0%) may further reduce complications at the cost of increased risk of hypoglycemia.    Hgb A1c MFr Bld  Date Value Ref Range Status  12/01/2020 8.2 (H) 4.8 - 5.6 % Final    Comment:             Prediabetes: 5.7 - 6.4          Diabetes: >6.4          Glycemic control for adults with diabetes: <7.0           Passed - Cr in normal range and within 360 days    Creatinine, Ser  Date Value Ref Range Status  12/01/2020 0.74 0.57 - 1.00 mg/dL Final          Passed - AA eGFR in normal range and within 360 days    GFR calc Af Amer  Date Value Ref Range Status  04/22/2020 106 >59 mL/min/1.73 Final    Comment:    **In accordance with recommendations from the NKF-ASN Task force,**   [facility] is in the process of updating its eGFR calculation to the   2021 CKD-EPI creatinine equation that estimates kidney  function   without a race variable.    GFR calc non Af Amer  Date Value Ref Range Status  04/22/2020 92 >59 mL/min/1.73 Final   eGFR  Date Value Ref Range Status  12/01/2020 92 >59 mL/min/1.73 Final          Passed - Valid encounter within last 6 months    Recent Outpatient Visits  1 month ago Acute left-sided thoracic back pain   [place], NP   3 months ago Hypertension associated with diabetes [facility])   [facility] Jon Billings, NP   11 months ago Hypertension associated with diabetes [facility])   [facility] Jon Billings, NP   1 year ago Hypertension associated with diabetes [facility])   [place], [place], DO   1 year ago Rash   [facility], [place]       Future Appointments             In 2 months Jon Billings, NP [facility], [place]

## 2021-03-28 NOTE — Telephone Encounter (Signed)
Left a message for patient regarding Lacey Billings, NP recommendations. Advised patient to give our office a call back if she has any other questions or concerns regarding message.

## 2021-03-29 ENCOUNTER — Ambulatory Visit: Payer: Self-pay

## 2021-03-29 NOTE — Telephone Encounter (Signed)
If patient wants an alternative we will need an appointment to discuss options.

## 2021-03-29 NOTE — Progress Notes (Deleted)
There were no vitals taken for this visit.   Subjective:    Patient ID: Lacey Schaefer, female    DOB: [birth year], 61 y.o.   MRN: [id number]  HPI: Lacey Schaefer is a 61 y.o. female  No chief complaint on file.  DIABETES Hypoglycemic episodes:{Blank single:19197::"yes","no"} Polydipsia/polyuria: {Blank single:19197::"yes","no"} Visual disturbance: {Blank single:19197::"yes","no"} Chest pain: {Blank single:19197::"yes","no"} Paresthesias: {Blank single:19197::"yes","no"} Glucose Monitoring: {Blank single:19197::"yes","no"}  Accucheck frequency: {Blank single:19197::"Not Checking","Daily","BID","TID"}  Fasting glucose:  Post prandial:  Evening:  Before meals: Taking Insulin?: {Blank single:19197::"yes","no"}  Long acting insulin:  Short acting insulin: Blood Pressure Monitoring: {Blank single:19197::"not checking","rarely","daily","weekly","monthly","a few times a day","a few times a week","a few times a month"} Retinal Examination: {Blank single:19197::"Up to Date","Not up to Date"} Foot Exam: {Blank single:19197::"Up to Date","Not up to Date"} Diabetic Education: {Blank single:19197::"Completed","Not Completed"} Pneumovax: {Blank single:19197::"Up to Date","Not up to Date","unknown"} Influenza: {Blank single:19197::"Up to Date","Not up to Date","unknown"} Aspirin: {Blank single:19197::"yes","no"}   Relevant past medical, surgical, family and social history reviewed and updated as indicated. Interim medical history since our last visit reviewed. Allergies and medications reviewed and updated.  Review of Systems  Per HPI unless specifically indicated above     Objective:    There were no vitals taken for this visit.  Wt Readings from Last 3 Encounters:  02/21/21 183 lb (83 kg)  12/01/20 169 lb 6.4 oz (76.8 kg)  04/22/20 185 lb 12.8 oz (84.3 kg)    Physical Exam  Results for orders placed or performed in visit on 12/01/20  Microscopic Examination   Urine  Result  Value Ref Range   WBC, UA 0-5 0 - 5 /hpf   RBC 0-2 0 - 2 /hpf   Epithelial Cells (non renal) 0-10 0 - 10 /hpf   Mucus, UA Present (A) Not Estab.   Bacteria, UA Few (A) None seen/Few  Urine Culture   Specimen: Urine   UR  Result Value Ref Range   Urine Culture, Routine Final report    Organism ID, Bacteria Comment   Comp Met (CMET)  Result Value Ref Range   Glucose 139 (H) 65 - 99 mg/dL   BUN 11 8 - 27 mg/dL   Creatinine, Ser 0.74 0.57 - 1.00 mg/dL   eGFR 92 >59 mL/min/1.73   BUN/Creatinine Ratio 15 12 - 28   Sodium 140 134 - 144 mmol/L   Potassium 3.5 3.5 - 5.2 mmol/L   Chloride 101 96 - 106 mmol/L   CO2 24 20 - 29 mmol/L   Calcium 9.5 8.7 - 10.3 mg/dL   Total Protein 6.7 6.0 - 8.5 g/dL   Albumin 4.0 3.8 - 4.8 g/dL   Globulin, Total 2.7 1.5 - 4.5 g/dL   Albumin/Globulin Ratio 1.5 1.2 - 2.2   Bilirubin Total 0.3 0.0 - 1.2 mg/dL   Alkaline Phosphatase 85 44 - 121 IU/L   AST 14 0 - 40 IU/L   ALT 14 0 - 32 IU/L  Lipid Profile  Result Value Ref Range   Cholesterol, Total 201 (H) 100 - 199 mg/dL   Triglycerides 106 0 - 149 mg/dL   HDL 62 >39 mg/dL   VLDL Cholesterol Cal 19 5 - 40 mg/dL   LDL Chol Calc (NIH) 120 (H) 0 - 99 mg/dL   Chol/HDL Ratio 3.2 0.0 - 4.4 ratio  HgB A1c  Result Value Ref Range   Hgb A1c MFr Bld 8.2 (H) 4.8 - 5.6 %   Est. average glucose Bld gHb Est-mCnc 189 mg/dL  Urinalysis,  Routine w reflex microscopic  Result Value Ref Range   Specific Gravity, UA 1.025 1.005 - 1.030   pH, UA 5.5 5.0 - 7.5   Color, UA Yellow Yellow   Appearance Ur Cloudy (A) Clear   Leukocytes,UA Trace (A) Negative   Protein,UA 1+ (A) Negative/Trace   Glucose, UA Negative Negative   Ketones, UA Negative Negative   RBC, UA Negative Negative   Bilirubin, UA Negative Negative   Urobilinogen, Ur 0.2 0.2 - 1.0 mg/dL   Nitrite, UA Negative Negative   Microscopic Examination See below:       Assessment & Plan:   Problem List Items Addressed This Visit   None    Follow up  plan: No follow-ups on file.

## 2021-03-29 NOTE — Telephone Encounter (Signed)
Summary: Med questions   Pt is requesting a call back from the office regarding her JANUMET 50-1000 MG tablet, she says she  Best contact: [phone]       Chief Complaint: States 2 pharmacies states her insurance will not pay for current Janumet, "but they will pay for plain Janumet." Asking if something "different can be called in or do I need to keep tomorrow's appointment.I don't want to miss time from work."  Symptoms: n/a Frequency: n/a Pertinent Negatives: Patient denies symptoms Disposition: [] ED /[] Urgent Care (no appt availability in office) / [] Appointment(In office/virtual)/ []  [facility]/ [] Home Care/ [] Refused Recommended Disposition /[] [place] [facility]/ []  Follow-up with PCP Additional Notes: Please advise pt.    Answer Assessment - Initial Assessment Questions 1. NAME of MEDICATION: "What medicine are you calling about?"     Janumet 2. QUESTION: "What is your question?" (e.g., double dose of medicine, side effect)     Pharmacy states pharmacy states her insurance won't pay, but will pay for "plain Janumet." 3. PRESCRIBING HCP: "Who prescribed it?" Reason: if prescribed by specialist, call should be referred to that group.     [place] 4. SYMPTOMS: "Do you have any symptoms?"     No 5. SEVERITY: If symptoms are present, ask "Are they mild, moderate or severe?"     N/a 6. PREGNANCY:  "Is there any chance that you are pregnant?" "When was your last menstrual period?"     No  Protocols used: Medication Question Call-A-AH

## 2021-03-29 NOTE — Telephone Encounter (Signed)
Pt made a telephone appt for 1/16 but wanted to know if a short supply of medication sent in to hold her over until her appt. Please advise.

## 2021-03-30 ENCOUNTER — Encounter: Payer: Self-pay | Admitting: Nurse Practitioner

## 2021-03-30 ENCOUNTER — Telehealth: Payer: Self-pay | Admitting: Nurse Practitioner

## 2021-03-30 ENCOUNTER — Ambulatory Visit (INDEPENDENT_AMBULATORY_CARE_PROVIDER_SITE_OTHER): Payer: 59 | Admitting: Nurse Practitioner

## 2021-03-30 ENCOUNTER — Ambulatory Visit: Payer: Self-pay | Admitting: Internal Medicine

## 2021-03-30 ENCOUNTER — Ambulatory Visit: Payer: Self-pay | Admitting: Nurse Practitioner

## 2021-03-30 ENCOUNTER — Other Ambulatory Visit: Payer: Self-pay

## 2021-03-30 VITALS — BP 140/85 | HR 77 | Temp 98.4°F | Wt 182.6 lb

## 2021-03-30 DIAGNOSIS — E1165 Type 2 diabetes mellitus with hyperglycemia: Secondary | ICD-10-CM

## 2021-03-30 MED ORDER — EMPAGLIFLOZIN 25 MG PO TABS: 25.0000 mg | ORAL_TABLET | Freq: Every day | ORAL | 1 refills | Status: DC

## 2021-03-30 NOTE — Telephone Encounter (Signed)
Pt's insurance needs a prior authorization for empagliflozin (JARDIANCE) 25 MG TABS tablet  The phone number is [phone] for [employer]   Pt states that this is very important because she is feeling under the weather

## 2021-03-30 NOTE — Progress Notes (Signed)
BP 140/85    Pulse 77    Temp 98.4 F (36.9 C) (Oral)    Wt 182 lb 9.6 oz (82.8 kg)    SpO2 98%    BMI 32.35 kg/m    Subjective:    Patient ID: Lacey Schaefer, female    DOB: May 06, 1960, 61 y.o.   MRN: [id number]  HPI: Lacey Schaefer is a 61 y.o. female  Chief Complaint  Patient presents with   Medication Management    Pt states she is here to discuss her Janumet. States it is not being covered by her insurance.    DIABETES Patient states she is here to discuss her Janumet. Patient states her insurance is no longer covering it.  She states she is not able to take Metformin by itself due to stomach issues.  States she has tried ER Metformin also. Would like an alternative medication.  Denies polyuria/polydipsia.    Relevant past medical, surgical, family and social history reviewed and updated as indicated. Interim medical history since our last visit reviewed. Allergies and medications reviewed and updated.  Review of Systems  Endocrine: Negative for polydipsia and polyuria.   Per HPI unless specifically indicated above     Objective:    BP 140/85    Pulse 77    Temp 98.4 F (36.9 C) (Oral)    Wt 182 lb 9.6 oz (82.8 kg)    SpO2 98%    BMI 32.35 kg/m   Wt Readings from Last 3 Encounters:  03/30/21 182 lb 9.6 oz (82.8 kg)  02/21/21 183 lb (83 kg)  12/01/20 169 lb 6.4 oz (76.8 kg)    Physical Exam Vitals and nursing note reviewed.  Constitutional:      General: She is not in acute distress.    Appearance: Normal appearance. She is normal weight. She is not ill-appearing, toxic-appearing or diaphoretic.  HENT:     Head: Normocephalic.     Right Ear: External ear normal.     Left Ear: External ear normal.     Nose: Nose normal.     Mouth/Throat:     Mouth: Mucous membranes are moist.     Pharynx: Oropharynx is clear.  Eyes:     General:        Right eye: No discharge.        Left eye: No discharge.     Extraocular Movements: Extraocular movements intact.      Conjunctiva/sclera: Conjunctivae normal.     Pupils: Pupils are equal, round, and reactive to light.  Cardiovascular:     Rate and Rhythm: Normal rate and regular rhythm.     Heart sounds: No murmur heard. Pulmonary:     Effort: Pulmonary effort is normal. No respiratory distress.     Breath sounds: Normal breath sounds. No wheezing or rales.  Musculoskeletal:     Cervical back: Normal range of motion and neck supple.  Skin:    General: Skin is warm and dry.     Capillary Refill: Capillary refill takes less than 2 seconds.  Neurological:     General: No focal deficit present.     Mental Status: She is alert and oriented to person, place, and time. Mental status is at baseline.  Psychiatric:        Mood and Affect: Mood normal.        Behavior: Behavior normal.        Thought Content: Thought content normal.  Judgment: Judgment normal.    Results for orders placed or performed in visit on 12/01/20  Microscopic Examination   Urine  Result Value Ref Range   WBC, UA 0-5 0 - 5 /hpf   RBC 0-2 0 - 2 /hpf   Epithelial Cells (non renal) 0-10 0 - 10 /hpf   Mucus, UA Present (A) Not Estab.   Bacteria, UA Few (A) None seen/Few  Urine Culture   Specimen: Urine   UR  Result Value Ref Range   Urine Culture, Routine Final report    Organism ID, Bacteria Comment   Comp Met (CMET)  Result Value Ref Range   Glucose 139 (H) 65 - 99 mg/dL   BUN 11 8 - 27 mg/dL   Creatinine, Ser 0.74 0.57 - 1.00 mg/dL   eGFR 92 >59 mL/min/1.73   BUN/Creatinine Ratio 15 12 - 28   Sodium 140 134 - 144 mmol/L   Potassium 3.5 3.5 - 5.2 mmol/L   Chloride 101 96 - 106 mmol/L   CO2 24 20 - 29 mmol/L   Calcium 9.5 8.7 - 10.3 mg/dL   Total Protein 6.7 6.0 - 8.5 g/dL   Albumin 4.0 3.8 - 4.8 g/dL   Globulin, Total 2.7 1.5 - 4.5 g/dL   Albumin/Globulin Ratio 1.5 1.2 - 2.2   Bilirubin Total 0.3 0.0 - 1.2 mg/dL   Alkaline Phosphatase 85 44 - 121 IU/L   AST 14 0 - 40 IU/L   ALT 14 0 - 32 IU/L  Lipid  Profile  Result Value Ref Range   Cholesterol, Total 201 (H) 100 - 199 mg/dL   Triglycerides 106 0 - 149 mg/dL   HDL 62 >39 mg/dL   VLDL Cholesterol Cal 19 5 - 40 mg/dL   LDL Chol Calc (NIH) 120 (H) 0 - 99 mg/dL   Chol/HDL Ratio 3.2 0.0 - 4.4 ratio  HgB A1c  Result Value Ref Range   Hgb A1c MFr Bld 8.2 (H) 4.8 - 5.6 %   Est. average glucose Bld gHb Est-mCnc 189 mg/dL  Urinalysis, Routine w reflex microscopic  Result Value Ref Range   Specific Gravity, UA 1.025 1.005 - 1.030   pH, UA 5.5 5.0 - 7.5   Color, UA Yellow Yellow   Appearance Ur Cloudy (A) Clear   Leukocytes,UA Trace (A) Negative   Protein,UA 1+ (A) Negative/Trace   Glucose, UA Negative Negative   Ketones, UA Negative Negative   RBC, UA Negative Negative   Bilirubin, UA Negative Negative   Urobilinogen, Ur 0.2 0.2 - 1.0 mg/dL   Nitrite, UA Negative Negative   Microscopic Examination See below:       Assessment & Plan:   Problem List Items Addressed This Visit       Endocrine   Type 2 diabetes mellitus with hyperglycemia ([place]) - Primary    Chronic. Will change Janumet to Jardiance. Patient has tried Jardiance in the past and it worked for her.  Will change to Jardiance 64m daily.  Keep follow up appointment for March. Patient is open to trying Trulicity or Ozempic if needed at that time.  Will draw labs at next visit.      Relevant Medications   empagliflozin (JARDIANCE) 25 MG TABS tablet     Follow up plan: No follow-ups on file.

## 2021-03-30 NOTE — Telephone Encounter (Signed)
Discussed with PCP at appointment this morning.

## 2021-03-30 NOTE — Assessment & Plan Note (Signed)
Chronic. Will change Janumet to Jardiance. Patient has tried Jardiance in the past and it worked for her.  Will change to Jardiance 25mg  daily.  Keep follow up appointment for March. Patient is open to trying Trulicity or Ozempic if needed at that time.  Will draw labs at next visit.

## 2021-03-30 NOTE — Telephone Encounter (Signed)
PA for Jardiance initiated and submitted via Cover My Meds. Key: [id number]

## 2021-03-31 ENCOUNTER — Ambulatory Visit: Payer: Self-pay

## 2021-03-31 ENCOUNTER — Telehealth: Payer: Self-pay | Admitting: Nurse Practitioner

## 2021-03-31 MED ORDER — SITAGLIPTIN PHOSPHATE 50 MG PO TABS: 50.0000 mg | ORAL_TABLET | Freq: Two times a day (BID) | ORAL | 1 refills | Status: DC

## 2021-03-31 NOTE — Telephone Encounter (Signed)
Medication sent into the pharmacy. 

## 2021-03-31 NOTE — Telephone Encounter (Signed)
Please let patient know that we are just waiting on the approval from her insurance company. The prescription is at the pharmacy and we are just waiting for her insurance to give the okay.

## 2021-03-31 NOTE — Telephone Encounter (Signed)
Spoke with patient and informed her that [name] would send in Lacey Schaefer. She verbalized understanding but still is asking for Glipizide since she has had good results with this medication in the past. I did inform her that [name] was sending in [place] and did not say anything about sending in Glipizide.

## 2021-03-31 NOTE — Telephone Encounter (Signed)
Patient asking for samples of SITagliptin-metFORMIN HCl 50-1000 MG for her trip, since she cn affored refil I told her about needing to discuss alternatives for med but she said she already had an appt on 01/12.

## 2021-03-31 NOTE — Telephone Encounter (Signed)
Medication was discontinue at last OV

## 2021-03-31 NOTE — Telephone Encounter (Signed)
Pt called in stating that she has been out of [place] and came to office yesterday. Pt was switched to [place] but waiting on PA. Pt states she is upset that she doesn't have her meds and she is been having chest pain. She has DM and HDL and we will pay if she gets sick from this. Advised pt that PA was submitted yesterday and just waiting on approval from insurance. Pt states she was going to call her CT MD [place] and [place].

## 2021-03-31 NOTE — Telephone Encounter (Signed)
Patient called in asking can she get samples of the

## 2021-04-03 ENCOUNTER — Telehealth: Payer: Self-pay | Admitting: Nurse Practitioner

## 2021-04-06 ENCOUNTER — Telehealth: Payer: Self-pay | Admitting: Nurse Practitioner

## 2021-04-06 NOTE — Telephone Encounter (Signed)
Copied from [place] [id number]. Topic: General - Other >> Apr 06, 2021  2:22 PM Tessa Lerner A wrote: Reason for CRM: The patient would like to speak with their PCP or a member of clinical staff about the cost of their medicationsitaGLIPtin (JANUVIA) 50 MG tablet [[id number]]   The patient would like to know if there are samples of the medication or similar mediations available  Please contact further when possible

## 2021-04-07 NOTE — Telephone Encounter (Signed)
Called and left a voicemail for patient to call back.  Please feel free to give her the following information.  Patient was seen in the office on January 12 to change her medication due to the [place] not being covered.  At that time Lacey Schaefer was prescribed at patient's request.  On 1/13 patient called back upset that a prior auth was needed to be completed.  She wanted another medication to be sent in at that time.  I sent in [place] on 1/13.  Her Lacey Rea PA has since been approved by her insurance.  I recommend she go to the pharmacy and pick up the Jardiance as discussed in our visit on 1/12.

## 2021-04-07 NOTE — Telephone Encounter (Signed)
Pt requested a call back regarding the information below, please advise.

## 2021-04-12 NOTE — Telephone Encounter (Signed)
Called and LVM asking for patient to please return my call.  

## 2021-04-13 ENCOUNTER — Telehealth: Payer: Self-pay | Admitting: Nurse Practitioner

## 2021-04-13 DIAGNOSIS — K5909 Other constipation: Secondary | ICD-10-CM | POA: Insufficient documentation

## 2021-04-13 DIAGNOSIS — K219 Gastro-esophageal reflux disease without esophagitis: Secondary | ICD-10-CM | POA: Insufficient documentation

## 2021-04-13 DIAGNOSIS — Z1211 Encounter for screening for malignant neoplasm of colon: Secondary | ICD-10-CM | POA: Insufficient documentation

## 2021-04-13 NOTE — Telephone Encounter (Signed)
Called patient to verify her insurance information.  Patient stopped by the office and told the front desk that she no longer has insurance but we did a PA 10 days ago for Vania Rea which was approved by her insurance.    Please find out what the cost of the Jardiance would be for the patient and make her aware the the PA was approved and it is at the pharmacy.  If it is too expensive her only other option is Glymepiride.

## 2021-04-16 ENCOUNTER — Other Ambulatory Visit: Payer: Self-pay | Admitting: Nurse Practitioner

## 2021-04-16 DIAGNOSIS — E1159 Type 2 diabetes mellitus with other circulatory complications: Secondary | ICD-10-CM

## 2021-04-16 NOTE — Telephone Encounter (Signed)
Requested Prescriptions  Pending Prescriptions Disp Refills   amLODipine (NORVASC) 10 MG tablet [Pharmacy Med Name: AMLODIPINE BESYLATE 10 MG TAB] 90 tablet 0    Sig: TAKE 1 TABLET BY MOUTH EVERY DAY     Cardiovascular:  Calcium Channel Blockers Failed - 04/16/2021 10:14 AM      Failed - Last BP in normal range    BP Readings from Last 1 Encounters:  03/30/21 140/85         Passed - Valid encounter within last 6 months    Recent Outpatient Visits          2 weeks ago Type 2 diabetes mellitus with hyperglycemia, without long-term current use of insulin ([place])   [place], [name], NP   1 month ago Acute left-sided thoracic back pain   [facility] Jon Billings, NP   4 months ago Hypertension associated with diabetes [facility])   [facility] Jon Billings, NP   11 months ago Hypertension associated with diabetes [facility])   [facility] Jon Billings, NP   1 year ago Hypertension associated with diabetes [facility])   [place], [place], DO      Future Appointments            In 1 month Jon Billings, NP [facility] [place]

## 2021-04-17 NOTE — Telephone Encounter (Signed)
Left message for patient to give our office in regards to prior authorization and Karen's recommendations.

## 2021-04-19 ENCOUNTER — Encounter: Admit: 2021-04-19 | Payer: PRIVATE HEALTH INSURANCE | Attending: Adult Health | Primary: Family Medicine

## 2021-05-18 ENCOUNTER — Telehealth: Payer: Self-pay | Admitting: Nurse Practitioner

## 2021-05-18 NOTE — Telephone Encounter (Signed)
Copied from [place] [id number]. Topic: General - Inquiry ?>> May 18, 2021  9:39 AM Oneta Rack wrote: ?Reason for CRM: Caller requesting to speak with Dr. Wynetta Emery directly regarding a urgent matter. Caller states her spouse is a patient of Dr. Wynetta Emery and Dr. Wynetta Emery knows who she is. Caller did not want to elaborate but did states its urgent. ?>> May 18, 2021  9:47 AM [address], [employer] L wrote: ?Pt states she has a question on symptoms of tourex.  ?

## 2021-05-19 ENCOUNTER — Telehealth: Payer: Self-pay | Admitting: Family Medicine

## 2021-05-19 NOTE — Telephone Encounter (Signed)
Appointment was scheduled for wife to discuss with me an issue her husband was having. She is not my patient and he was not on the call. This was booked inappropriately. In future, she can pass information along to have a phone call returned, but should not be scheduled as appointment. FYI ?

## 2021-05-19 NOTE — Telephone Encounter (Signed)
I cannot have a visit with her to discuss her husband. She is more than welcome to let me know what she would like to talk about and I can call her, but I can't see her about him ?

## 2021-05-19 NOTE — Telephone Encounter (Signed)
I was under the impression that patient just had a general question. When I spoke with her she did not tell me it was about her husband specifically. She was very vague which is why I scheduled the appointment for her to discuss; but noted. Thank you. ?

## 2021-06-01 ENCOUNTER — Ambulatory Visit: Payer: Self-pay | Admitting: Nurse Practitioner

## 2021-06-29 ENCOUNTER — Emergency Department
Admission: EM | Admit: 2021-06-29 | Discharge: 2021-06-29 | Disposition: A | Discharge status: Home / Self Care | Payer: Self-pay | Attending: Emergency Medicine | Admitting: Emergency Medicine

## 2021-06-29 ENCOUNTER — Encounter: Payer: Self-pay | Admitting: Emergency Medicine

## 2021-06-29 ENCOUNTER — Emergency Department: Payer: Self-pay

## 2021-06-29 ENCOUNTER — Other Ambulatory Visit: Payer: Self-pay

## 2021-06-29 DIAGNOSIS — R778 Other specified abnormalities of plasma proteins: Secondary | ICD-10-CM | POA: Insufficient documentation

## 2021-06-29 DIAGNOSIS — R519 Headache, unspecified: Secondary | ICD-10-CM | POA: Insufficient documentation

## 2021-06-29 DIAGNOSIS — E876 Hypokalemia: Secondary | ICD-10-CM | POA: Insufficient documentation

## 2021-06-29 DIAGNOSIS — I10 Essential (primary) hypertension: Secondary | ICD-10-CM | POA: Insufficient documentation

## 2021-06-29 DIAGNOSIS — E119 Type 2 diabetes mellitus without complications: Secondary | ICD-10-CM | POA: Insufficient documentation

## 2021-06-29 DIAGNOSIS — R079 Chest pain, unspecified: Secondary | ICD-10-CM | POA: Insufficient documentation

## 2021-06-29 LAB — TROPONIN I (HIGH SENSITIVITY)
Troponin I (High Sensitivity): 12 ng/L (ref <18)
Troponin I (High Sensitivity): 16 ng/L (ref <18)
Troponin I (High Sensitivity): 16 ng/L (ref <18)

## 2021-06-29 LAB — CBC
HCT: 38 % (ref 36.0–46.0)
Hemoglobin: 11.8 g/dL — ABNORMAL LOW (ref 12.0–15.0)
MCH: 26.8 pg (ref 26.0–34.0)
MCHC: 31.1 g/dL (ref 30.0–36.0)
MCV: 86.4 fL (ref 80.0–100.0)
Platelets: 328 10*3/uL (ref 150–400)
RBC: 4.4 MIL/uL (ref 3.87–5.11)
RDW: 14.1 % (ref 11.5–15.5)
WBC: 6 10*3/uL (ref 4.0–10.5)
nRBC: 0 % (ref 0.0–0.2)

## 2021-06-29 LAB — URINALYSIS, ROUTINE W REFLEX MICROSCOPIC
Bacteria, UA: NONE SEEN
Bilirubin Urine: NEGATIVE
Glucose, UA: NEGATIVE mg/dL
Ketones, ur: NEGATIVE mg/dL
Leukocytes,Ua: NEGATIVE
Nitrite: NEGATIVE
Protein, ur: 30 mg/dL — AB
Specific Gravity, Urine: 1.006 (ref 1.005–1.030)
pH: 7 (ref 5.0–8.0)

## 2021-06-29 LAB — COMPREHENSIVE METABOLIC PANEL
ALT: 19 U/L (ref 0–44)
AST: 14 U/L — ABNORMAL LOW (ref 15–41)
Albumin: 3.9 g/dL (ref 3.5–5.0)
Alkaline Phosphatase: 55 U/L (ref 38–126)
Anion gap: 10 (ref 5–15)
BUN: 11 mg/dL (ref 8–23)
CO2: 27 mmol/L (ref 22–32)
Calcium: 9.3 mg/dL (ref 8.9–10.3)
Chloride: 101 mmol/L (ref 98–111)
Creatinine, Ser: 0.61 mg/dL (ref 0.44–1.00)
GFR, Estimated: >60 mL/min (ref >60)
Glucose, Bld: 168 mg/dL — ABNORMAL HIGH (ref 70–99)
Potassium: 2.9 mmol/L — ABNORMAL LOW (ref 3.5–5.1)
Sodium: 138 mmol/L (ref 135–145)
Total Bilirubin: 0.6 mg/dL (ref 0.3–1.2)
Total Protein: 7.4 g/dL (ref 6.5–8.1)

## 2021-06-29 LAB — MAGNESIUM: Magnesium: 2.2 mg/dL (ref 1.7–2.4)

## 2021-06-29 MED ORDER — IOHEXOL 350 MG/ML SOLN
100.0000 mL | Freq: Once | INTRAVENOUS | Status: AC | PRN
Start: 2021-06-29 — End: 2021-06-29
  Administered 2021-06-29: 100 mL via INTRAVENOUS

## 2021-06-29 MED ORDER — MORPHINE SULFATE (PF) 4 MG/ML IV SOLN
4.0000 mg | Freq: Once | INTRAVENOUS | Status: AC
  Administered 2021-06-29: 4 mg via INTRAVENOUS
  Filled 2021-06-29: qty 1

## 2021-06-29 MED ORDER — POTASSIUM CHLORIDE CRYS ER 20 MEQ PO TBCR
40.0000 meq | EXTENDED_RELEASE_TABLET | Freq: Once | ORAL | Status: AC
  Administered 2021-06-29: 40 meq via ORAL
  Filled 2021-06-29: qty 2

## 2021-06-29 MED ORDER — POTASSIUM CHLORIDE CRYS ER 20 MEQ PO TBCR: 20.0000 meq | EXTENDED_RELEASE_TABLET | Freq: Every day | ORAL | 0 refills | Status: DC

## 2021-06-29 MED ORDER — POTASSIUM CHLORIDE 10 MEQ/100ML IV SOLN
10.0000 meq | Freq: Once | INTRAVENOUS | Status: AC
  Administered 2021-06-29: 10 meq via INTRAVENOUS
  Filled 2021-06-29: qty 100

## 2021-06-29 MED ORDER — ONDANSETRON HCL 4 MG/2ML IJ SOLN
4.0000 mg | Freq: Once | INTRAMUSCULAR | Status: AC
  Administered 2021-06-29: 4 mg via INTRAVENOUS
  Filled 2021-06-29: qty 2

## 2021-06-29 NOTE — ED Provider Notes (Signed)
? ?[facility] ?Provider Note ? ? ? Event Date/Time  ? First MD Initiated Contact with Patient 06/29/21 1607   ?  (approximate) ? ? ?History  ? ?Chest Pain ? ? ?HPI ? ?Lacey Schaefer is a 61 y.o. female with diabetes, hypertension who comes in with concern for chest pain. Pt states the chest pain off and on for a while now. Normally its at night and with lifting.  Today it was more severe and she was at work with some pain radiating down her arm with hand tingling. This was this morning. She took tylenol and ibuprofen for the pain. No missed doses of meds.  The pain is better but still 8/10.  Nothing makes it worse.  ? ?I reviewed patient's cardiology note from 01/11/2021 where patient had cardiac CTA to evaluate for coronary disease.  Then on 1/26 she followed up patient's chest pain had completely resolved therefore she did not have a cardiac CT.  She had a stress test on 12/2019 that was abnormal myocardial perfusion but no clear evidence of reversible ischemia therefore pt has not had cath.  ? ? ? ?Physical Exam  ? ?Triage Vital Signs: ?ED Triage Vitals  ?Enc Vitals Group  ?   BP 06/29/21 1605 (!) 163/93  ?   Pulse Rate 06/29/21 1605 85  ?   Resp 06/29/21 1605 17  ?   Temp 06/29/21 1605 98.8 ?F (37.1 ?C)  ?   Temp Source 06/29/21 1605 Oral  ?   SpO2 06/29/21 1605 94 %  ?   Weight 06/29/21 1557 182 lb 8.7 oz (82.8 kg)  ?   Height 06/29/21 1557 '5\' 3"'$  (1.6 m)  ?   Head Circumference --   ?   Peak Flow --   ?   Pain Score 06/29/21 1557 8  ?   Pain Loc --   ?   Pain Edu? --   ?   Excl. in [place]? --   ? ? ?Most recent vital signs: ?Vitals:  ? 06/29/21 1605  ?BP: (!) 163/93  ?Pulse: 85  ?Resp: 17  ?Temp: 98.8 ?F (37.1 ?C)  ?SpO2: 94%  ? ? ? ?General: Awake, no distress.  ?CV:  Good peripheral perfusion.  ?Resp:  Normal effort.  ?Abd:  No distention.  ?Other:  No leg swelling, no calf tenderness  ? ? ?ED Results / Procedures / Treatments  ? ?Labs ?(all labs ordered are listed, but only abnormal  results are displayed) ?Labs Reviewed  ?CBC - Abnormal; Notable for the following components:  ?    Result Value  ? Hemoglobin 11.8 (*)   ? All other components within normal limits  ?COMPREHENSIVE METABOLIC PANEL - Abnormal; Notable for the following components:  ? Potassium 2.9 (*)   ? Glucose, Bld 168 (*)   ? AST 14 (*)   ? All other components within normal limits  ?URINALYSIS, ROUTINE W REFLEX MICROSCOPIC - Abnormal; Notable for the following components:  ? Color, Urine STRAW (*)   ? APPearance CLEAR (*)   ? Hgb urine dipstick SMALL (*)   ? Protein, ur 30 (*)   ? All other components within normal limits  ?MAGNESIUM  ?TROPONIN I (HIGH SENSITIVITY)  ?TROPONIN I (HIGH SENSITIVITY)  ?TROPONIN I (HIGH SENSITIVITY)  ? ? ? ?EKG ? ?My interpretation of EKG: ? ?Normal sinus rate of 86, no st elevation, no twi, normal intervals ? ?Repeat EKG is sinus rate of 67 without any ST elevation or  T wave inversions, slightly prolonged QT at 488 ? ?EKG is sinus rate of 67 without any ST elevation or T wave inversions with QTc of 4 7 ? ? ?RADIOLOGY ?I have reviewed the xray personally and no PNA ? ?CT pending ? ? ?PROCEDURES: ? ?Critical Care performed: No ? ?.1-3 Lead EKG Interpretation ?Performed by: Vanessa [place], MD ?Authorized by: Vanessa Aguanga, MD  ? ?  Interpretation: normal   ?  ECG rate:  60 ?  ECG rate assessment: normal   ?  Rhythm: sinus rhythm   ?  Ectopy: none   ?  Conduction: normal   ? ? ?MEDICATIONS ORDERED IN ED: ?Medications  ?morphine (PF) 4 MG/ML injection 4 mg (4 mg Intravenous Given 06/29/21 1854)  ?ondansetron [facility]) injection 4 mg (4 mg Intravenous Given 06/29/21 1852)  ?iohexol (OMNIPAQUE) 350 MG/ML injection 100 mL (100 mLs Intravenous Contrast Given 06/29/21 1810)  ? ? ? ?IMPRESSION / MDM / ASSESSMENT AND PLAN / ED COURSE  ?I reviewed the triage vital signs and the nursing notes. ?             ?               ? ?Differential diagnosis includes, but is not limited to, ACS, atypical chest pain, dissection  sounds less like likely but given the reporting of some tingling in her left arm with sudden onset of pain will get CT to further evaluate.  Upon ordering CT she also reports having a fall and hitting her head really hard a few days ago.  She has no C-spine tenderness.  Morphine, Zofran given for pain. ? ?CBC shows slightly low hemoglobin 11.8.  Initial troponin was 12 and upon repeat went to 16.  Her urine is without evidence of UTI.  Her CMP does show low potassium at 2.9 we will give some repletion. ? ?CT head is negative ? ?CT scan shows possible adrenal nodule provided copy of report for patient to follow-up with outpatient ? ?8:13 PM reevaluated patient patient is chest pain-free.  We discussed the possibility of coronary disease and being admitted for possible stress test versus following up outpatient to the patient's risk factors- heart score 4.  Patient preferred to go home and return of chest pain came back.  Husband is at bedside who is agreeable that he will bring patient back if she develops recurrent chest pain.  I will send a message to Dr. Clayborn Bigness to try to get her in for either Friday or Monday.  Discussed that her troponin was slightly elevated given her pain started just prior to coming down recommended getting a third troponin if pt really wanted to go home.  ? ? ?9:58 PM ? ?Repeat troponin was stable and patient still has resolution of symptoms and is requesting discharge home.  Patient understands the importance of coming back if chest pain begins.  Explained to patient I cannot predict future heart attacks but this time there is no evidence of ischemia based upon her troponins.  She expressed understanding would much more prefer to go home and come back if symptoms return ? ?We discussed not exerting herself until getting follow-up ? ?I discussed the provisional nature of ED diagnosis, the treatment so far, the ongoing plan of care, follow up appointments and return precautions with the  patient and any family or support people present. They expressed understanding and agreed with the plan, discharged home. ? ? ? ? ? ? ? ?The patient  is on the cardiac monitor to evaluate for evidence of arrhythmia and/or significant heart rate changes. ?FINAL CLINICAL IMPRESSION(S) / ED DIAGNOSES  ? ?Final diagnoses:  ?None  ? ? ? ?Rx / DC Orders  ? ?ED Discharge Orders   ? ? None  ? ?  ? ? ? ?Note:  This document was prepared using Dragon voice recognition software and may include unintentional dictation errors. ?  ?Vanessa [place], MD ?06/29/21 2200 ? ?

## 2021-06-29 NOTE — ED Triage Notes (Signed)
Pt comes into the ED via POV c/o left side chest pain that has been intermittent for months.  Pt states her cardiologist is aware of the pain.  Pt states that today the pain got worse and was unable to be controlled with tylenol and ibuprofen.  Pt states today while at work she had a sudden episode of chest pressure.  PT ambulatory with even and unlabored respirations.  ?

## 2021-06-29 NOTE — Discharge Instructions (Addendum)
Your potassium was slightly low.  SHe should take 3 days of potassium.  If you are not already taking potassium at home then you can not fill this potassium and just use the potassium that you already have ? ?We discussed admission versus going home for your chest pain but given resolution of symptoms with normal troponins you felt comfortable going home but need to call Dr. Etta Quill office tomorrow to try to get follow-up Friday or Monday at the latest and you must return to the ER if you develop return of chest pain ? ? ? ?1. No thoracic or abdominal aortic aneurysm or dissection. ?2. No acute lung process. ?3. Thickening of the medial limb of the right adrenal gland with ?possible nodule measuring up to approximately 13 mm. Correlation ?with prior CT or MRI imaging would help assess stability and ?determine the benign versus malignant nature of this nodule. In the ?absence of prior cancer history, this is probably benign. A ?follow-up adrenal protocol CT may be considered in 12 months to ?assess stability. If this patient has known primary cancer, an ?adrenal protocol CT of the abdomen could be performed for further ?evaluation on a nonemergent basis. ([employer], Jerl Santos [facility], Renee Ramus ?GL, Rolanda Lundborg, [place] GM, [place] PJ, [place], Pandhar[place] ?PV. Management of Incidental Adrenal Masses: A White Paper of the ?ACR Incidental Findings Committee. J Am Coll Radiol. 2017 ?Aug;14(8):1038-1044. doi: 10.1016/j.jacr.2017.05.001. Epub 2017 Jun ?23. PMID: [id number].) ?

## 2021-07-08 ENCOUNTER — Other Ambulatory Visit: Payer: Self-pay | Admitting: Nurse Practitioner

## 2021-07-08 DIAGNOSIS — I152 Hypertension secondary to endocrine disorders: Secondary | ICD-10-CM

## 2021-07-10 NOTE — Telephone Encounter (Signed)
Requested medication (s) are due for refill today- yes ? ?Requested medication (s) are on the active medication list -yes ? ?Future visit scheduled -no ? ?Last refill: 03/25/21 #90 ? ?Notes to clinic: Request RF: abnormal labs- sent for review of request ? ?Requested Prescriptions  ?Pending Prescriptions Disp Refills  ? hydrochlorothiazide (HYDRODIURIL) 25 MG tablet [Pharmacy Med Name: HYDROCHLOROTHIAZIDE 25 MG TAB] 90 tablet 1  ?  Sig: TAKE 1 TABLET (25 MG TOTAL) BY MOUTH DAILY.  ?  ? Cardiovascular: Diuretics - Thiazide Failed - 07/08/2021  9:56 AM  ?  ?  Failed - K in normal range and within 180 days  ?  Potassium  ?Date Value Ref Range Status  ?06/29/2021 2.9 (L) 3.5 - 5.1 mmol/L Final  ?  ?  ?  ?  Failed - Last BP in normal range  ?  BP Readings from Last 1 Encounters:  ?06/29/21 (!) 153/84  ?  ?  ?  ?  Passed - Cr in normal range and within 180 days  ?  Creatinine, Ser  ?Date Value Ref Range Status  ?06/29/2021 0.61 0.44 - 1.00 mg/dL Final  ?  ?  ?  ?  Passed - Na in normal range and within 180 days  ?  Sodium  ?Date Value Ref Range Status  ?06/29/2021 138 135 - 145 mmol/L Final  ?12/01/2020 140 134 - 144 mmol/L Final  ?  ?  ?  ?  Passed - Valid encounter within last 6 months  ?  Recent Outpatient Visits   ? ?      ? 3 months ago Type 2 diabetes mellitus with hyperglycemia, without long-term current use of insulin ([place])  ? [place], NP  ? 4 months ago Acute left-sided thoracic back pain  ? [place], NP  ? 7 months ago Hypertension associated with diabetes [facility])  ? [place], NP  ? 1 year ago Hypertension associated with diabetes [facility])  ? [place], NP  ? 1 year ago Hypertension associated with diabetes [facility])  ? [place], [place] P, DO  ? ?  ?  ? ? ?  ?  ?  ? ? ? ?Requested Prescriptions  ?Pending Prescriptions Disp Refills  ? hydrochlorothiazide (HYDRODIURIL)  25 MG tablet [Pharmacy Med Name: HYDROCHLOROTHIAZIDE 25 MG TAB] 90 tablet 1  ?  Sig: TAKE 1 TABLET (25 MG TOTAL) BY MOUTH DAILY.  ?  ? Cardiovascular: Diuretics - Thiazide Failed - 07/08/2021  9:56 AM  ?  ?  Failed - K in normal range and within 180 days  ?  Potassium  ?Date Value Ref Range Status  ?06/29/2021 2.9 (L) 3.5 - 5.1 mmol/L Final  ?  ?  ?  ?  Failed - Last BP in normal range  ?  BP Readings from Last 1 Encounters:  ?06/29/21 (!) 153/84  ?  ?  ?  ?  Passed - Cr in normal range and within 180 days  ?  Creatinine, Ser  ?Date Value Ref Range Status  ?06/29/2021 0.61 0.44 - 1.00 mg/dL Final  ?  ?  ?  ?  Passed - Na in normal range and within 180 days  ?  Sodium  ?Date Value Ref Range Status  ?06/29/2021 138 135 - 145 mmol/L Final  ?12/01/2020 140 134 - 144 mmol/L Final  ?  ?  ?  ?  Passed - Valid encounter within  last 6 months  ?  Recent Outpatient Visits   ? ?      ? 3 months ago Type 2 diabetes mellitus with hyperglycemia, without long-term current use of insulin ([place])  ? [place], NP  ? 4 months ago Acute left-sided thoracic back pain  ? [place], NP  ? 7 months ago Hypertension associated with diabetes [facility])  ? [place], NP  ? 1 year ago Hypertension associated with diabetes [facility])  ? [place], NP  ? 1 year ago Hypertension associated with diabetes [facility])  ? [place], [place] P, DO  ? ?  ?  ? ? ?  ?  ?  ? ? ? ?

## 2021-07-26 ENCOUNTER — Other Ambulatory Visit: Payer: Self-pay | Admitting: Student

## 2021-07-26 ENCOUNTER — Other Ambulatory Visit (HOSPITAL_COMMUNITY): Payer: Self-pay | Admitting: Emergency Medicine

## 2021-07-26 DIAGNOSIS — R079 Chest pain, unspecified: Secondary | ICD-10-CM

## 2021-07-26 DIAGNOSIS — R002 Palpitations: Secondary | ICD-10-CM

## 2021-07-26 MED ORDER — IVABRADINE HCL 5 MG PO TABS: 15.0000 mg | ORAL_TABLET | Freq: Once | ORAL | 0 refills | Status: AC

## 2021-07-26 MED ORDER — DILTIAZEM HCL 60 MG PO TABS: 60.0000 mg | ORAL_TABLET | Freq: Once | ORAL | 0 refills | Status: DC

## 2021-07-26 NOTE — Progress Notes (Signed)
Per chart reivew for CCTA with BA- '60mg'$  diltiazem + '15mg'$  ivabradine ordered for HR control. ? ? ?Marchia Bond RN Navigator Cardiac Imaging ?[facility] ?[phone] Office  ?[phone] Cell ? ?

## 2021-07-28 ENCOUNTER — Ambulatory Visit: Payer: Self-pay | Admitting: Nurse Practitioner

## 2021-07-28 ENCOUNTER — Other Ambulatory Visit: Payer: Self-pay | Admitting: Nurse Practitioner

## 2021-07-28 NOTE — Telephone Encounter (Signed)
Copied from [place] [id number]. Topic: General - Other ?>> Jul 28, 2021  2:58 PM Tessa Lerner A wrote: ?Reason for CRM: Medication Refill - Medication: butalbital-acetaminophen-caffeine (FIORICET) 50-325-40 MG tablet [[id number]] - patient has 0 tablets remaining  ? ?Has the patient contacted their pharmacy? Yes.  The patient has been directed to contact their PCP ?(Agent: If no, request that the patient contact the pharmacy for the refill. If patient does not wish to contact the pharmacy document the reason why and proceed with request.) ?(Agent: If yes, when and what did the pharmacy advise?) ? ?Preferred Pharmacy (with phone number or street name): [place], [place] ?[address] [place] [address] ?Phone: [phone] Fax: [phone] ?Hours: Not open 24 hours ? ? ?Has the patient been seen for an appointment in the last year OR does the patient have an upcoming appointment? Yes.   ? ?Agent: Please be advised that RX refills may take up to 3 business days. We ask that you follow-up with your pharmacy. ?

## 2021-07-31 MED ORDER — BUTALBITAL-APAP-CAFFEINE 50-325-40 MG PO TABS: 1.0000 | ORAL_TABLET | Freq: Four times a day (QID) | ORAL | 0 refills | Status: DC | PRN

## 2021-07-31 NOTE — Telephone Encounter (Signed)
Requested medication (s) are due for refill today: yes ? ?Requested medication (s) are on the active medication list: yes ? ?Last refill:  02/21/21 #14/0 ? ?Future visit scheduled: yes ? ?Notes to clinic:  Unable to refill per protocol, cannot delegate. ? ?  ?Requested Prescriptions  ?Pending Prescriptions Disp Refills  ? butalbital-acetaminophen-caffeine (FIORICET) 50-325-40 MG tablet 14 tablet 0  ?  Sig: Take 1 tablet by mouth every 6 (six) hours as needed for headache.  ?  ? Not Delegated - Analgesics:  Non-Opioid Analgesic Combinations 2 Failed - 07/31/2021  7:53 AM  ?  ?  Failed - This refill cannot be delegated  ?  ?  Passed - Cr in normal range and within 360 days  ?  Creatinine, Ser  ?Date Value Ref Range Status  ?06/29/2021 0.61 0.44 - 1.00 mg/dL Final  ?   ?  ?  Passed - eGFR is 10 or above and within 360 days  ?  GFR calc Af Amer  ?Date Value Ref Range Status  ?04/22/2020 106 >59 mL/min/1.73 Final  ?  Comment:  ?  **In accordance with recommendations from the NKF-ASN Task force,** ?  [facility] is in the process of updating its eGFR calculation to the ?  2021 CKD-EPI creatinine equation that estimates kidney function ?  without a race variable. ?  ? ?GFR, Estimated  ?Date Value Ref Range Status  ?06/29/2021 >60 >60 mL/min Final  ?  Comment:  ?  (NOTE) ?Calculated using the CKD-EPI Creatinine Equation (2021) ?  ? ?eGFR  ?Date Value Ref Range Status  ?12/01/2020 92 >59 mL/min/1.73 Final  ?   ?  ?  Passed - Patient is not pregnant  ?  ?  Passed - Valid encounter within last 12 months  ?  Recent Outpatient Visits   ? ?      ? 4 months ago Type 2 diabetes mellitus with hyperglycemia, without long-term current use of insulin ([place])  ? [place], NP  ? 5 months ago Acute left-sided thoracic back pain  ? [place], NP  ? 8 months ago Hypertension associated with diabetes [facility])  ? [place], NP  ? 1 year ago Hypertension  associated with diabetes [facility])  ? [place], NP  ? 1 year ago Hypertension associated with diabetes [facility])  ? [place], [place] P, DO  ? ?  ?  ?Future Appointments   ? ?        ? In 1 week Jon Billings, NP [facility]  ? ?  ? ? ?  ?  ?  ? ?

## 2021-08-02 ENCOUNTER — Telehealth (HOSPITAL_COMMUNITY): Payer: Self-pay | Admitting: *Deleted

## 2021-08-02 NOTE — Telephone Encounter (Signed)
Reaching out to patient to offer assistance regarding upcoming cardiac imaging study; pt verbalizes understanding of appt date/time, parking situation and where to check in, pre-test NPO status and medications ordered, name and call back number provided for further questions should they arise ? ?Gordy Clement RN Navigator Cardiac Imaging ?[facility] ?[phone] office ?[phone] cell ? ?Patient to take '60mg'$  cardizem and '15mg'$  ivabradine two hours prior to her cardiac CT scan. ?

## 2021-08-03 ENCOUNTER — Ambulatory Visit
Admission: RE | Admit: 2021-08-03 | Discharge: 2021-08-03 | Disposition: A | Discharge status: Home / Self Care | Payer: 59 | Source: Ambulatory Visit | Attending: Student | Admitting: Student

## 2021-08-03 ENCOUNTER — Other Ambulatory Visit (HOSPITAL_COMMUNITY): Payer: Self-pay | Admitting: *Deleted

## 2021-08-03 DIAGNOSIS — R079 Chest pain, unspecified: Secondary | ICD-10-CM | POA: Insufficient documentation

## 2021-08-03 DIAGNOSIS — R002 Palpitations: Secondary | ICD-10-CM

## 2021-08-03 MED ORDER — IOHEXOL 350 MG/ML SOLN
87.0000 mL | Freq: Once | INTRAVENOUS | Status: AC | PRN
  Administered 2021-08-03: 87 mL via INTRAVENOUS

## 2021-08-03 MED ORDER — IVABRADINE HCL 5 MG PO TABS
ORAL_TABLET | ORAL | 0 refills | Status: DC
Start: 2021-08-03 — End: 2021-10-31

## 2021-08-03 MED ORDER — DILTIAZEM HCL 25 MG/5ML IV SOLN
10.0000 mg | Freq: Once | INTRAVENOUS | Status: AC
  Administered 2021-08-03: 10 mg via INTRAVENOUS

## 2021-08-03 MED ORDER — DILTIAZEM HCL 60 MG PO TABS: 60.0000 mg | ORAL_TABLET | Freq: Once | ORAL | 0 refills | Status: DC

## 2021-08-03 MED ORDER — NITROGLYCERIN 0.4 MG SL SUBL: 0.8000 mg | SUBLINGUAL_TABLET | Freq: Once | SUBLINGUAL | Status: DC

## 2021-08-03 NOTE — Progress Notes (Signed)
Patient prepared for test, anxious about test, placed in scanner, dose of cardizem given for hight HR. Second dose of medication refused and patient decided she couldn't continue with the test and she would discuss with her cardiologist and reschedule. Patient states she "didn't sleep well" due to "anxiety" of having test.

## 2021-08-07 ENCOUNTER — Ambulatory Visit (INDEPENDENT_AMBULATORY_CARE_PROVIDER_SITE_OTHER): Payer: 59 | Admitting: Nurse Practitioner

## 2021-08-07 ENCOUNTER — Encounter: Payer: Self-pay | Admitting: Nurse Practitioner

## 2021-08-07 ENCOUNTER — Ambulatory Visit: Payer: Self-pay | Admitting: Nurse Practitioner

## 2021-08-07 VITALS — BP 137/81 | HR 93 | Temp 98.3°F | Wt 173.2 lb

## 2021-08-07 DIAGNOSIS — E1159 Type 2 diabetes mellitus with other circulatory complications: Secondary | ICD-10-CM | POA: Diagnosis not present

## 2021-08-07 DIAGNOSIS — E785 Hyperlipidemia, unspecified: Secondary | ICD-10-CM

## 2021-08-07 DIAGNOSIS — I152 Hypertension secondary to endocrine disorders: Secondary | ICD-10-CM

## 2021-08-07 DIAGNOSIS — M25511 Pain in right shoulder: Secondary | ICD-10-CM

## 2021-08-07 DIAGNOSIS — G8929 Other chronic pain: Secondary | ICD-10-CM | POA: Diagnosis not present

## 2021-08-07 DIAGNOSIS — E1169 Type 2 diabetes mellitus with other specified complication: Secondary | ICD-10-CM | POA: Diagnosis not present

## 2021-08-07 DIAGNOSIS — E1165 Type 2 diabetes mellitus with hyperglycemia: Secondary | ICD-10-CM | POA: Diagnosis not present

## 2021-08-07 MED ORDER — JANUMET 50-1000 MG PO TABS: 1.0000 | ORAL_TABLET | Freq: Two times a day (BID) | ORAL | 1 refills | Status: DC

## 2021-08-07 NOTE — Progress Notes (Deleted)
There were no vitals taken for this visit.   Subjective:    Patient ID: Lacey Schaefer, female    DOB: 1960-04-14, 61 y.o.   MRN: [id number]  HPI: Lacey Schaefer is a 61 y.o. female  No chief complaint on file.  HYPERTENSION / HYPERLIPIDEMIA Satisfied with current treatment? yes Duration of hypertension: years BP monitoring frequency: daily BP range: 120-130/70-80 BP medication side effects: no Past BP meds:  hydralazine, amlodipine, carvedilol, and HCTZ Duration of hyperlipidemia: years Cholesterol medication side effects: no Cholesterol supplements: none Past cholesterol medications: pravastatin (pravachol) Medication compliance: excellent compliance Aspirin: yes Recent stressors: no Recurrent headaches: no Visual changes: no Palpitations: no Dyspnea: no Chest pain: no Lower extremity edema: no Dizzy/lightheaded: no  DIABETES Hypoglycemic episodes:no Polydipsia/polyuria: no Visual disturbance: no Chest pain: no Paresthesias: no Glucose Monitoring: no  Accucheck frequency: TID  Fasting glucose:  Post prandial:  Evening:  Before meals: Taking Insulin?: no  Long acting insulin:  Short acting insulin: Blood Pressure Monitoring: daily Retinal Examination: Not up to Date Foot Exam: Up to Date Diabetic Education: Not Completed Pneumovax: Not up to Date Influenza: Not up to Date Aspirin: no   Patient states she is having some itching and burning of the  Relevant past medical, surgical, family and social history reviewed and updated as indicated. Interim medical history since our last visit reviewed. Allergies and medications reviewed and updated.  Review of Systems  Eyes:  Negative for visual disturbance.  Respiratory:  Negative for cough, chest tightness and shortness of breath.   Cardiovascular:  Negative for chest pain, palpitations and leg swelling.  Endocrine: Negative for polydipsia and polyuria.  Genitourinary:  Positive for dysuria.   Musculoskeletal:  Positive for back pain.  Neurological:  Negative for dizziness, numbness and headaches.   Per HPI unless specifically indicated above     Objective:    There were no vitals taken for this visit.  Wt Readings from Last 3 Encounters:  06/29/21 182 lb 8.7 oz (82.8 kg)  03/30/21 182 lb 9.6 oz (82.8 kg)  02/21/21 183 lb (83 kg)    Physical Exam Vitals and nursing note reviewed.  Constitutional:      General: She is not in acute distress.    Appearance: Normal appearance. She is normal weight. She is not ill-appearing, toxic-appearing or diaphoretic.  HENT:     Head: Normocephalic.     Right Ear: External ear normal.     Left Ear: External ear normal.     Nose: Nose normal.     Mouth/Throat:     Mouth: Mucous membranes are moist.     Pharynx: Oropharynx is clear.  Eyes:     General:        Right eye: No discharge.        Left eye: No discharge.     Extraocular Movements: Extraocular movements intact.     Conjunctiva/sclera: Conjunctivae normal.     Pupils: Pupils are equal, round, and reactive to light.  Cardiovascular:     Rate and Rhythm: Normal rate and regular rhythm.     Heart sounds: No murmur heard. Pulmonary:     Effort: Pulmonary effort is normal. No respiratory distress.     Breath sounds: Normal breath sounds. No wheezing or rales.  Musculoskeletal:        General: No tenderness. Normal range of motion.     Cervical back: Normal range of motion and neck supple.     Right lower leg: No  edema.     Left lower leg: No edema.  Skin:    General: Skin is warm and dry.     Capillary Refill: Capillary refill takes less than 2 seconds.  Neurological:     General: No focal deficit present.     Mental Status: She is alert and oriented to person, place, and time. Mental status is at baseline.  Psychiatric:        Mood and Affect: Mood normal.        Behavior: Behavior normal.        Thought Content: Thought content normal.        Judgment: Judgment  normal.    Results for orders placed or performed during the hospital encounter of 06/29/21  CBC  Result Value Ref Range   WBC 6.0 4.0 - 10.5 K/uL   RBC 4.40 3.87 - 5.11 MIL/uL   Hemoglobin 11.8 (L) 12.0 - 15.0 g/dL   HCT 38.0 36.0 - 46.0 %   MCV 86.4 80.0 - 100.0 fL   MCH 26.8 26.0 - 34.0 pg   MCHC 31.1 30.0 - 36.0 g/dL   RDW 14.1 11.5 - 15.5 %   Platelets 328 150 - 400 K/uL   nRBC 0.0 0.0 - 0.2 %  Comprehensive metabolic panel  Result Value Ref Range   Sodium 138 135 - 145 mmol/L   Potassium 2.9 (L) 3.5 - 5.1 mmol/L   Chloride 101 98 - 111 mmol/L   CO2 27 22 - 32 mmol/L   Glucose, Bld 168 (H) 70 - 99 mg/dL   BUN 11 8 - 23 mg/dL   Creatinine, Ser 0.61 0.44 - 1.00 mg/dL   Calcium 9.3 8.9 - 10.3 mg/dL   Total Protein 7.4 6.5 - 8.1 g/dL   Albumin 3.9 3.5 - 5.0 g/dL   AST 14 (L) 15 - 41 U/L   ALT 19 0 - 44 U/L   Alkaline Phosphatase 55 38 - 126 U/L   Total Bilirubin 0.6 0.3 - 1.2 mg/dL   GFR, Estimated >60 >60 mL/min   Anion gap 10 5 - 15  Urinalysis, Routine w reflex microscopic Urine, Clean Catch  Result Value Ref Range   Color, Urine STRAW (A) YELLOW   APPearance CLEAR (A) CLEAR   Specific Gravity, Urine 1.006 1.005 - 1.030   pH 7.0 5.0 - 8.0   Glucose, UA NEGATIVE NEGATIVE mg/dL   Hgb urine dipstick SMALL (A) NEGATIVE   Bilirubin Urine NEGATIVE NEGATIVE   Ketones, ur NEGATIVE NEGATIVE mg/dL   Protein, ur 30 (A) NEGATIVE mg/dL   Nitrite NEGATIVE NEGATIVE   Leukocytes,Ua NEGATIVE NEGATIVE   RBC / HPF 0-5 0 - 5 RBC/hpf   WBC, UA 0-5 0 - 5 WBC/hpf   Bacteria, UA NONE SEEN NONE SEEN   Squamous Epithelial / LPF 0-5 0 - 5  Magnesium  Result Value Ref Range   Magnesium 2.2 1.7 - 2.4 mg/dL  Troponin I (High Sensitivity)  Result Value Ref Range   Troponin I (High Sensitivity) 12 <18 ng/L  Troponin I (High Sensitivity)  Result Value Ref Range   Troponin I (High Sensitivity) 16 <18 ng/L  Troponin I (High Sensitivity)  Result Value Ref Range   Troponin I (High  Sensitivity) 16 <18 ng/L      Assessment & Plan:   Problem List Items Addressed This Visit       Cardiovascular and Mediastinum   Hypertension associated with diabetes ([place])     Endocrine   Type 2  diabetes mellitus with hyperglycemia ([place]) - Primary   Hyperlipidemia associated with type 2 diabetes mellitus ([place])     Follow up plan: No follow-ups on file.

## 2021-08-07 NOTE — Progress Notes (Unsigned)
BP 137/81   Pulse 93   Temp 98.3 F (36.8 C) (Oral)   Wt 173 lb 3.2 oz (78.6 kg)   SpO2 98%   BMI 30.68 kg/m    Subjective:    Patient ID: Lacey Schaefer, female    DOB: 02-14-1961, 61 y.o.   MRN: [id number]  HPI: Lacey Schaefer is a 61 y.o. female  Chief Complaint  Patient presents with   Diabetes    Follow up - pt states she is having issues with her R shoulder. Would like to discuss ?medication for relief.    HYPERTENSION / HYPERLIPIDEMIA Satisfied with current treatment? yes Duration of hypertension: years BP monitoring frequency: daily BP range: 120-130/70-80 BP medication side effects: no Past BP meds:  hydralazine, amlodipine, carvedilol, and HCTZ Duration of hyperlipidemia: years Cholesterol medication side effects: no Cholesterol supplements: none Past cholesterol medications: pravastatin (pravachol) Medication compliance: excellent compliance Aspirin: yes Recent stressors: no Recurrent headaches: no Visual changes: no Palpitations: no Dyspnea: no Chest pain: no Lower extremity edema: no Dizzy/lightheaded: no  DIABETES Hypoglycemic episodes:no Polydipsia/polyuria: no Visual disturbance: no Chest pain: no Paresthesias: no Glucose Monitoring: no  Accucheck frequency: TID  Fasting glucose:  Post prandial:  Evening:  Before meals: Taking Insulin?: no  Long acting insulin:  Short acting insulin: Blood Pressure Monitoring: daily Retinal Examination: Not up to Date Foot Exam: Up to Date Diabetic Education: Not Completed Pneumovax: Not up to Date Influenza: Not up to Date Aspirin: no Patient states she was able to go back on the Janumet because she got new insurance. She has been getting it from Endocrinology in [place].  SHOULDER PAIN Duration: months Involved shoulder: right Mechanism of injury: unknown Location: diffuse Onset:gradual Severity: 6/10  Quality:  aching Frequency: constant Patient did not complete physical therapy.   She is wondering about a cortisone infection.  Relevant past medical, surgical, family and social history reviewed and updated as indicated. Interim medical history since our last visit reviewed. Allergies and medications reviewed and updated.  Review of Systems  Eyes:  Negative for visual disturbance.  Respiratory:  Negative for cough, chest tightness and shortness of breath.   Cardiovascular:  Negative for chest pain, palpitations and leg swelling.  Endocrine: Negative for polydipsia and polyuria.  Musculoskeletal:        Right shoulder pain  Neurological:  Negative for dizziness, numbness and headaches.   Per HPI unless specifically indicated above     Objective:    BP 137/81   Pulse 93   Temp 98.3 F (36.8 C) (Oral)   Wt 173 lb 3.2 oz (78.6 kg)   SpO2 98%   BMI 30.68 kg/m   Wt Readings from Last 3 Encounters:  08/07/21 173 lb 3.2 oz (78.6 kg)  06/29/21 182 lb 8.7 oz (82.8 kg)  03/30/21 182 lb 9.6 oz (82.8 kg)    Physical Exam Vitals and nursing note reviewed.  Constitutional:      General: She is not in acute distress.    Appearance: Normal appearance. She is normal weight. She is not ill-appearing, toxic-appearing or diaphoretic.  HENT:     Head: Normocephalic.     Right Ear: External ear normal.     Left Ear: External ear normal.     Nose: Nose normal.     Mouth/Throat:     Mouth: Mucous membranes are moist.     Pharynx: Oropharynx is clear.  Eyes:     General:        Right  eye: No discharge.        Left eye: No discharge.     Extraocular Movements: Extraocular movements intact.     Conjunctiva/sclera: Conjunctivae normal.     Pupils: Pupils are equal, round, and reactive to light.  Cardiovascular:     Rate and Rhythm: Normal rate and regular rhythm.     Heart sounds: No murmur heard. Pulmonary:     Effort: Pulmonary effort is normal. No respiratory distress.     Breath sounds: Normal breath sounds. No wheezing or rales.  Musculoskeletal:         General: No tenderness. Normal range of motion.     Cervical back: Normal range of motion and neck supple.     Right lower leg: No edema.     Left lower leg: No edema.  Skin:    General: Skin is warm and dry.     Capillary Refill: Capillary refill takes less than 2 seconds.  Neurological:     General: No focal deficit present.     Mental Status: She is alert and oriented to person, place, and time. Mental status is at baseline.  Psychiatric:        Mood and Affect: Mood normal.        Behavior: Behavior normal.        Thought Content: Thought content normal.        Judgment: Judgment normal.    Results for orders placed or performed in visit on 08/07/21  Comp Met (CMET)  Result Value Ref Range   Glucose 204 (H) 70 - 99 mg/dL   BUN 11 8 - 27 mg/dL   Creatinine, Ser 0.71 0.57 - 1.00 mg/dL   eGFR 96 >59 mL/min/1.73   BUN/Creatinine Ratio 15 12 - 28   Sodium 139 134 - 144 mmol/L   Potassium 3.4 (L) 3.5 - 5.2 mmol/L   Chloride 99 96 - 106 mmol/L   CO2 25 20 - 29 mmol/L   Calcium 9.7 8.7 - 10.3 mg/dL   Total Protein 6.7 6.0 - 8.5 g/dL   Albumin 4.2 3.8 - 4.8 g/dL   Globulin, Total 2.5 1.5 - 4.5 g/dL   Albumin/Globulin Ratio 1.7 1.2 - 2.2   Bilirubin Total <0.2 0.0 - 1.2 mg/dL   Alkaline Phosphatase 75 44 - 121 IU/L   AST 11 0 - 40 IU/L   ALT 16 0 - 32 IU/L  Lipid Profile  Result Value Ref Range   Cholesterol, Total 211 (H) 100 - 199 mg/dL   Triglycerides 246 (H) 0 - 149 mg/dL   HDL 52 >39 mg/dL   VLDL Cholesterol Cal 43 (H) 5 - 40 mg/dL   LDL Chol Calc (NIH) 116 (H) 0 - 99 mg/dL   Chol/HDL Ratio 4.1 0.0 - 4.4 ratio  HgB A1c  Result Value Ref Range   Hgb A1c MFr Bld 8.9 (H) 4.8 - 5.6 %   Est. average glucose Bld gHb Est-mCnc 209 mg/dL      Assessment & Plan:   Problem List Items Addressed This Visit       Cardiovascular and Mediastinum   Hypertension associated with diabetes (HCC)    Chronic.  Controlled.  Continue with current medication regimen.  Labs ordered  today.  Return to clinic in 6 months for reevaluation.  Call sooner if concerns arise.         Relevant Medications   sitaGLIPtin-metformin (JANUMET) 50-1000 MG tablet   empagliflozin (JARDIANCE) 25 MG TABS tablet   Other  Relevant Orders   HgB A1c (Completed)     Endocrine   Type 2 diabetes mellitus with hyperglycemia (HCC)    Chronic. On Janumet. Patient has tried Jardiance in the past and it worked for her.  Will change to Jardiance 25m daily.  Labs ordered today. Will make recommendations based on lab results.        Relevant Medications   sitaGLIPtin-metformin (JANUMET) 50-1000 MG tablet   empagliflozin (JARDIANCE) 25 MG TABS tablet   Other Relevant Orders   Comp Met (CMET) (Completed)   Hyperlipidemia associated with type 2 diabetes mellitus ([place] - Primary    Under good control on current regimen. Continue current regimen. Continue to monitor. Call with any concerns. Refills given. Labs drawn today.         Relevant Medications   sitaGLIPtin-metformin (JANUMET) 50-1000 MG tablet   empagliflozin (JARDIANCE) 25 MG TABS tablet   Other Relevant Orders   Lipid Profile (Completed)   Other Visit Diagnoses     Chronic right shoulder pain       Will refer patient to Orthopedics and Physical therapy.  Follow up if symptoms do not improve.   Relevant Orders   Ambulatory referral to Orthopedics   Ambulatory referral to Physical Therapy        Follow up plan: No follow-ups on file.

## 2021-08-08 LAB — COMPREHENSIVE METABOLIC PANEL WITH GFR
ALT: 16 [IU]/L (ref 0–32)
AST: 11 [IU]/L (ref 0–40)
Albumin/Globulin Ratio: 1.7 (ref 1.2–2.2)
Albumin: 4.2 g/dL (ref 3.8–4.8)
Alkaline Phosphatase: 75 [IU]/L (ref 44–121)
BUN/Creatinine Ratio: 15 (ref 12–28)
BUN: 11 mg/dL (ref 8–27)
Bilirubin Total: <0.2 mg/dL (ref 0.0–1.2)
CO2: 25 mmol/L (ref 20–29)
Calcium: 9.7 mg/dL (ref 8.7–10.3)
Chloride: 99 mmol/L (ref 96–106)
Creatinine, Ser: 0.71 mg/dL (ref 0.57–1.00)
Globulin, Total: 2.5 g/dL (ref 1.5–4.5)
Glucose: 204 mg/dL — ABNORMAL HIGH (ref 70–99)
Potassium: 3.4 mmol/L — ABNORMAL LOW (ref 3.5–5.2)
Sodium: 139 mmol/L (ref 134–144)
Total Protein: 6.7 g/dL (ref 6.0–8.5)
eGFR: 96 mL/min/{1.73_m2}

## 2021-08-08 LAB — LIPID PANEL
Chol/HDL Ratio: 4.1 ratio (ref 0.0–4.4)
Cholesterol, Total: 211 mg/dL — ABNORMAL HIGH (ref 100–199)
HDL: 52 mg/dL (ref >39)
LDL Chol Calc (NIH): 116 mg/dL — ABNORMAL HIGH (ref 0–99)
Triglycerides: 246 mg/dL — ABNORMAL HIGH (ref 0–149)
VLDL Cholesterol Cal: 43 mg/dL — ABNORMAL HIGH (ref 5–40)

## 2021-08-08 LAB — HEMOGLOBIN A1C
Est. average glucose Bld gHb Est-mCnc: 209 mg/dL
Hgb A1c MFr Bld: 8.9 % — ABNORMAL HIGH (ref 4.8–5.6)

## 2021-08-08 MED ORDER — EMPAGLIFLOZIN 25 MG PO TABS: 25.0000 mg | ORAL_TABLET | Freq: Every day | ORAL | 1 refills | Status: DC

## 2021-08-08 NOTE — Assessment & Plan Note (Signed)
Under good control on current regimen. Continue current regimen. Continue to monitor. Call with any concerns. Refills given. Labs drawn today.   

## 2021-08-08 NOTE — Assessment & Plan Note (Signed)
Chronic.  Controlled.  Continue with current medication regimen.  Labs ordered today.  Return to clinic in 6 months for reevaluation.  Call sooner if concerns arise.  ? ?

## 2021-08-08 NOTE — Assessment & Plan Note (Signed)
Chronic. On Janumet. Patient has tried Jardiance in the past and it worked for her.  Will change to Jardiance '25mg'$  daily.  Labs ordered today. Will make recommendations based on lab results.

## 2021-08-08 NOTE — Progress Notes (Signed)
Please let patient know that her lab work shows that her A1c increased to 8.9.  I would like to add Jardiance.  I looked up her prescription drug coverage and I believe with this insurance it is now covered.  If she has any problems with getting the medication please let me know.  I also recommend she decrease her carbohydrate intake.   Patient's cholesterol is also elevated from prior.  I recommend following a low fat diet and exercising 5x weekly.    Please move her appointment up from 6 months to 3 months so we can see how she is doing.  I have sent the new medication to the pharmacy for her.

## 2021-08-09 ENCOUNTER — Telehealth (HOSPITAL_COMMUNITY): Payer: Self-pay | Admitting: Emergency Medicine

## 2021-08-09 ENCOUNTER — Telehealth: Payer: Self-pay

## 2021-08-09 NOTE — Telephone Encounter (Signed)
Reaching out to patient to offer assistance regarding upcoming cardiac imaging study; pt verbalizes understanding of appt date/time, parking situation and where to check in, pre-test NPO status and medications ordered, and verified current allergies; name and call back number provided for further questions should they arise Marchia Bond RN Navigator Cardiac Imaging [facility] [phone] office [phone] cell  Pt preference to hold daily meds til after scan Taking '60mg'$  dilt + '15mg'$  ivab 2 hr prior to scan Arrival 830

## 2021-08-09 NOTE — Telephone Encounter (Signed)
PA initiated for Janumet through covermymeds. Awaiting determination.   Key: [id number]

## 2021-08-10 ENCOUNTER — Ambulatory Visit: Admission: RE | Admit: 2021-08-10 | Payer: 59 | Source: Ambulatory Visit

## 2021-08-11 ENCOUNTER — Telehealth: Payer: Self-pay

## 2021-08-11 NOTE — Telephone Encounter (Signed)
PA has been approved for Janumet. Left detailed VM regarding approval.

## 2021-08-24 ENCOUNTER — Other Ambulatory Visit: Payer: Self-pay | Admitting: Nurse Practitioner

## 2021-08-24 NOTE — Telephone Encounter (Signed)
Medication Refill - Medication:  potassium chloride (KLOR-CON) 20 MEQ packet   Has the patient contacted their pharmacy? Yes.   Contact PCP  Preferred Pharmacy (with phone number or street name):  [place], [place] - [place]  [address] [place] [address]  Phone:  [phone]  Fax:  [phone]   Has the patient been seen for an appointment in the last year OR does the patient have an upcoming appointment? Yes.    Agent: Please be advised that RX refills may take up to 3 business days. We ask that you follow-up with your pharmacy.

## 2021-08-24 NOTE — Telephone Encounter (Signed)
Requested medication (s) are due for refill today - unsure  Requested medication (s) are on the active medication list -yes  Future visit scheduled -yes  Last refill: unsure  Notes to clinic: historical provider  Requested Prescriptions  Pending Prescriptions Disp Refills   potassium chloride (KLOR-CON) 20 MEQ packet      Sig: Take 20 mEq by mouth daily.     Endocrinology:  Minerals - Potassium Supplementation Failed - 08/24/2021 12:27 PM      Failed - K in normal range and within 360 days    Potassium  Date Value Ref Range Status  08/07/2021 3.4 (L) 3.5 - 5.2 mmol/L Final         Passed - Cr in normal range and within 360 days    Creatinine, Ser  Date Value Ref Range Status  08/07/2021 0.71 0.57 - 1.00 mg/dL Final         Passed - Valid encounter within last 12 months    Recent Outpatient Visits           2 weeks ago Hyperlipidemia associated with type 2 diabetes mellitus ([place])   [facility] Jon Billings, NP   4 months ago Type 2 diabetes mellitus with hyperglycemia, without long-term current use of insulin ([place])   [place], [name], NP   6 months ago Acute left-sided thoracic back pain   [facility] Jon Billings, NP   8 months ago Hypertension associated with diabetes ([place])   [facility] Jon Billings, NP   1 year ago Hypertension associated with diabetes ([place])   [place] Jon Billings, NP       Future Appointments             In 2 months Jon Billings, NP [place], [facility]               Requested Prescriptions  Pending Prescriptions Disp Refills   potassium chloride (KLOR-CON) 20 MEQ packet      Sig: Take 20 mEq by mouth daily.     Endocrinology:  Minerals - Potassium Supplementation Failed - 08/24/2021 12:27 PM      Failed - K in normal range and within 360 days    Potassium  Date Value Ref Range Status  08/07/2021 3.4 (L) 3.5 - 5.2  mmol/L Final         Passed - Cr in normal range and within 360 days    Creatinine, Ser  Date Value Ref Range Status  08/07/2021 0.71 0.57 - 1.00 mg/dL Final         Passed - Valid encounter within last 12 months    Recent Outpatient Visits           2 weeks ago Hyperlipidemia associated with type 2 diabetes mellitus ([place])   [facility] Jon Billings, NP   4 months ago Type 2 diabetes mellitus with hyperglycemia, without long-term current use of insulin ([place])   [facility] Jon Billings, NP   6 months ago Acute left-sided thoracic back pain   [facility] Jon Billings, NP   8 months ago Hypertension associated with diabetes [facility])   [facility] Jon Billings, NP   1 year ago Hypertension associated with diabetes [facility])   [place], Karen, NP       Future Appointments             In 2 months Jon Billings, NP [facility], [place]

## 2021-08-25 ENCOUNTER — Other Ambulatory Visit: Payer: Self-pay | Admitting: Physician Assistant

## 2021-08-25 ENCOUNTER — Ambulatory Visit: Payer: Self-pay | Admitting: *Deleted

## 2021-08-25 ENCOUNTER — Other Ambulatory Visit: Payer: Self-pay | Admitting: Nurse Practitioner

## 2021-08-25 MED ORDER — POTASSIUM CHLORIDE 20 MEQ PO PACK: 20.0000 meq | PACK | Freq: Every day | ORAL | 0 refills | Status: DC

## 2021-08-25 NOTE — Telephone Encounter (Signed)
Message from [place] sent at 08/25/2021  9:21 AM EDT  Summary: potassiuim med request   Pt called in states is going out of town and needs potassium chloride 10 mEq in 100 mL and this was prescribed by another Dr. She can't reach the prescribing Dr, What can she do in the mean time until she gets the medicine? Please cal back. [name], [name]  Phone: [phone]  Fax: [phone]           Call History   Type Contact Phone/Fax User  08/25/2021 09:19 AM EDT Phone (Incoming) Lacey Schaefer, Lacey Schaefer (Self) [phone] [name]) [name], [name]

## 2021-08-25 NOTE — Telephone Encounter (Signed)
  Chief Complaint: Needs medication refilled Symptoms:  Frequency:  Pertinent Negatives: Patient denies  Disposition: '[]'$ ED /'[]'$ Urgent Care (no appt availability in office) / '[]'$ Appointment(In office/virtual)/ '[]'$  [place] [facility]/ '[]'$ Home Care/ '[]'$ Refused Recommended Disposition /'[]'$ [place] [facility]/ x Follow-up with PCP Additional Notes: Pt needs a rfill on Klor -con 20 packets. Prescription was writtne by another provider.   Pt is leaving for [place] in a few hours. If unable to refill soon, please send prescription to [facility] in [place], [place]    Summary: potassiuim med request   Pt called in states is going out of town and needs potassium chloride 10 mEq in 100 mL and this was prescribed by another Dr. She can't reach the prescribing Dr, What can she do in the mean time until she gets the medicine? Please cal back. [place], [place]  Phone: [phone]  Fax: [phone]      Reason for Disposition  [1] Prescription refill request for ESSENTIAL medicine (i.e., likelihood of harm to patient if not taken) AND [2] triager unable to refill per department policy  Answer Assessment - Initial Assessment Questions 1. DRUG NAME: "What medicine do you need to have refilled?"     Klor-con 38mq packet 2. REFILLS REMAINING: "How many refills are remaining?" (Note: The label on the medicine or pill bottle will show how many refills are remaining. If there are no refills remaining, then a renewal may be needed.)     0 3. EXPIRATION DATE: "What is the expiration date?" (Note: The label states when the prescription will expire, and thus can no longer be refilled.)     0 4. PRESCRIBING HCP: "Who prescribed it?" Reason: If prescribed by specialist, call should be referred to that group.     0 5. SYMPTOMS: "Do you have any symptoms?"     0 6. PREGNANCY: "Is there any chance that you are pregnant?" "When was your last menstrual  period?"     0  Protocols used: Medication Refill and Renewal Call-A-AH

## 2021-08-25 NOTE — Telephone Encounter (Signed)
Attempted to return call   Left voicemail to return call to discuss rx issue with a nurse

## 2021-08-25 NOTE — Addendum Note (Signed)
Addended by: Georgina Peer on: 08/25/2021 01:22 PM   Modules accepted: Orders

## 2021-08-25 NOTE — Telephone Encounter (Signed)
Called and let patient know that medication was sent in for her. Patient states that she needs the tablets to be sent in instead of the powder.   Lacey Schaefer, can you send in the tablets for the patient? [name] has logged off for the day.

## 2021-10-19 ENCOUNTER — Other Ambulatory Visit: Payer: 59

## 2021-10-19 ENCOUNTER — Ambulatory Visit: Admission: RE | Admit: 2021-10-19 | Payer: 59 | Source: Ambulatory Visit

## 2021-10-24 ENCOUNTER — Other Ambulatory Visit: Payer: Self-pay | Admitting: Nurse Practitioner

## 2021-10-24 NOTE — Telephone Encounter (Signed)
Requested medication (s) are due for refill today - yes  Requested medication (s) are on the active medication list -yes  Future visit scheduled -yes  Last refill: 07/31/21 #14  Notes to clinic: non delegated Rx  Requested Prescriptions  Pending Prescriptions Disp Refills   butalbital-acetaminophen-caffeine (FIORICET) 50-325-40 MG tablet [Pharmacy Med Name: Butalbital-APAP-Caffeine 50-325-40 MG Oral Tablet] 14 tablet 0    Sig: TAKE 1 TABLET BY MOUTH EVERY 6 HOURS AS NEEDED FOR HEADACHE     Not Delegated - Analgesics:  Non-Opioid Analgesic Combinations 2 Failed - 10/24/2021  9:49 AM      Failed - This refill cannot be delegated      Passed - Cr in normal range and within 360 days    Creatinine, Ser  Date Value Ref Range Status  08/07/2021 0.71 0.57 - 1.00 mg/dL Final         Passed - eGFR is 10 or above and within 360 days    GFR calc Af Amer  Date Value Ref Range Status  04/22/2020 106 >59 mL/min/1.73 Final    Comment:    **In accordance with recommendations from the NKF-ASN Task force,**   [facility] is in the process of updating its eGFR calculation to the   2021 CKD-EPI creatinine equation that estimates kidney function   without a race variable.    GFR, Estimated  Date Value Ref Range Status  06/29/2021 >60 >60 mL/min Final    Comment:    (NOTE) Calculated using the CKD-EPI Creatinine Equation (2021)    eGFR  Date Value Ref Range Status  08/07/2021 96 >59 mL/min/1.73 Final         Passed - Patient is not pregnant      Passed - Valid encounter within last 12 months    Recent Outpatient Visits           2 months ago Hyperlipidemia associated with type 2 diabetes mellitus ([place])   [facility] Jon Billings, NP   6 months ago Type 2 diabetes mellitus with hyperglycemia, without long-term current use of insulin ([place])   [place], [name], NP   8 months ago Acute left-sided thoracic back pain   [facility]  Jon Billings, NP   10 months ago Hypertension associated with diabetes ([place])   [facility] Jon Billings, NP   1 year ago Hypertension associated with diabetes ([place])   [facility] Jon Billings, NP       Future Appointments             In 2 weeks Jon Billings, NP [place], [facility]               Requested Prescriptions  Pending Prescriptions Disp Refills   butalbital-acetaminophen-caffeine (FIORICET) 50-325-40 MG tablet [Pharmacy Med Name: Butalbital-APAP-Caffeine 50-325-40 MG Oral Tablet] 14 tablet 0    Sig: TAKE 1 TABLET BY MOUTH EVERY 6 HOURS AS NEEDED FOR HEADACHE     Not Delegated - Analgesics:  Non-Opioid Analgesic Combinations 2 Failed - 10/24/2021  9:49 AM      Failed - This refill cannot be delegated      Passed - Cr in normal range and within 360 days    Creatinine, Ser  Date Value Ref Range Status  08/07/2021 0.71 0.57 - 1.00 mg/dL Final         Passed - eGFR is 10 or above and within 360 days    GFR calc Af Wyvonnia Lora  Date Value Ref  Range Status  04/22/2020 106 >59 mL/min/1.73 Final    Comment:    **In accordance with recommendations from the NKF-ASN Task force,**   [facility] is in the process of updating its eGFR calculation to the   2021 CKD-EPI creatinine equation that estimates kidney function   without a race variable.    GFR, Estimated  Date Value Ref Range Status  06/29/2021 >60 >60 mL/min Final    Comment:    (NOTE) Calculated using the CKD-EPI Creatinine Equation (2021)    eGFR  Date Value Ref Range Status  08/07/2021 96 >59 mL/min/1.73 Final         Passed - Patient is not pregnant      Passed - Valid encounter within last 12 months    Recent Outpatient Visits           2 months ago Hyperlipidemia associated with type 2 diabetes mellitus ([place])   [facility] Jon Billings, NP   6 months ago Type 2 diabetes mellitus with hyperglycemia, without long-term current use of  insulin ([place])   [facility] Jon Billings, NP   8 months ago Acute left-sided thoracic back pain   [facility] Jon Billings, NP   10 months ago Hypertension associated with diabetes [facility])   [facility] Jon Billings, NP   1 year ago Hypertension associated with diabetes [facility])   [place], Karen, NP       Future Appointments             In 2 weeks Jon Billings, NP [facility], [place]

## 2021-10-27 DIAGNOSIS — M25511 Pain in right shoulder: Secondary | ICD-10-CM | POA: Diagnosis not present

## 2021-10-30 ENCOUNTER — Emergency Department: Payer: 59

## 2021-10-30 ENCOUNTER — Other Ambulatory Visit: Payer: Self-pay

## 2021-10-30 ENCOUNTER — Emergency Department
Admission: EM | Admit: 2021-10-30 | Discharge: 2021-10-30 | Disposition: A | Discharge status: Home / Self Care | Payer: 59 | Attending: Emergency Medicine | Admitting: Emergency Medicine

## 2021-10-30 DIAGNOSIS — Z5321 Procedure and treatment not carried out due to patient leaving prior to being seen by health care provider: Secondary | ICD-10-CM | POA: Diagnosis not present

## 2021-10-30 DIAGNOSIS — R200 Anesthesia of skin: Secondary | ICD-10-CM | POA: Insufficient documentation

## 2021-10-30 DIAGNOSIS — R519 Headache, unspecified: Secondary | ICD-10-CM | POA: Diagnosis not present

## 2021-10-30 LAB — COMPREHENSIVE METABOLIC PANEL
ALT: 17 U/L (ref 0–44)
AST: 14 U/L — ABNORMAL LOW (ref 15–41)
Albumin: 4 g/dL (ref 3.5–5.0)
Alkaline Phosphatase: 61 U/L (ref 38–126)
Anion gap: 9 (ref 5–15)
BUN: 17 mg/dL (ref 8–23)
CO2: 26 mmol/L (ref 22–32)
Calcium: 9.2 mg/dL (ref 8.9–10.3)
Chloride: 104 mmol/L (ref 98–111)
Creatinine, Ser: 0.76 mg/dL (ref 0.44–1.00)
GFR, Estimated: >60 mL/min (ref >60)
Glucose, Bld: 152 mg/dL — ABNORMAL HIGH (ref 70–99)
Potassium: 3 mmol/L — ABNORMAL LOW (ref 3.5–5.1)
Sodium: 139 mmol/L (ref 135–145)
Total Bilirubin: 0.2 mg/dL — ABNORMAL LOW (ref 0.3–1.2)
Total Protein: 7.3 g/dL (ref 6.5–8.1)

## 2021-10-30 LAB — CBC
HCT: 38.9 % (ref 36.0–46.0)
Hemoglobin: 12 g/dL (ref 12.0–15.0)
MCH: 26.2 pg (ref 26.0–34.0)
MCHC: 30.8 g/dL (ref 30.0–36.0)
MCV: 84.9 fL (ref 80.0–100.0)
Platelets: 345 10*3/uL (ref 150–400)
RBC: 4.58 MIL/uL (ref 3.87–5.11)
RDW: 14.4 % (ref 11.5–15.5)
WBC: 6.3 10*3/uL (ref 4.0–10.5)
nRBC: 0 % (ref 0.0–0.2)

## 2021-10-30 LAB — DIFFERENTIAL
Abs Immature Granulocytes: 0.02 10*3/uL (ref 0.00–0.07)
Basophils Absolute: 0.1 10*3/uL (ref 0.0–0.1)
Basophils Relative: 1 %
Eosinophils Absolute: 0.1 10*3/uL (ref 0.0–0.5)
Eosinophils Relative: 2 %
Immature Granulocytes: 0 %
Lymphocytes Relative: 34 %
Lymphs Abs: 2.2 10*3/uL (ref 0.7–4.0)
Monocytes Absolute: 0.4 10*3/uL (ref 0.1–1.0)
Monocytes Relative: 6 %
Neutro Abs: 3.6 10*3/uL (ref 1.7–7.7)
Neutrophils Relative %: 57 %

## 2021-10-30 LAB — PROTIME-INR
INR: 1 (ref 0.8–1.2)
Prothrombin Time: 13 seconds (ref 11.4–15.2)

## 2021-10-30 LAB — APTT: aPTT: 34 seconds (ref 24–36)

## 2021-10-30 LAB — ETHANOL: Alcohol, Ethyl (B): <10 mg/dL (ref <10)

## 2021-10-30 NOTE — ED Provider Triage Note (Signed)
Emergency Medicine Provider Triage Evaluation Note  Lacey Schaefer , a 61 y.o. female  was evaluated in triage.  Pt complains of headache, pain at her temples, numbness and tingling, started this morning around 7 AM.  Review of Systems  Positive: Headache, tingling Negative: Slurred speech, chest pain, shortness of breath  Physical Exam  BP (!) 160/85 (BP Location: Left Arm)   Pulse 81   Resp 18   Ht '5\' 4"'$  (1.626 m)   Wt 81.2 kg   SpO2 96%   BMI 30.73 kg/m  Gen:   Awake, no distress   Resp:  Normal effort  MSK:   Moves extremities without difficulty  Other:    Medical Decision Making  Medically screening exam initiated at 3:13 PM.  Appropriate orders placed.  Lacey Schaefer was informed that the remainder of the evaluation will be completed by another provider, this initial triage assessment does not replace that evaluation, and the importance of remaining in the ED until their evaluation is complete.  Stroke protocols initiated by nursing staff   Versie Starks, PA-C 10/30/21 1515

## 2021-10-30 NOTE — ED Triage Notes (Signed)
Pt to ED via POV from home. Pt reports she woke up and started having temple pain on both sides of head. Pt reports tingling in left arm that has resolved. Pt states symptoms started around 7am and has been intermittent since.   Pt denies blood thinner, slurred speech or visual changes.

## 2021-10-31 ENCOUNTER — Ambulatory Visit (INDEPENDENT_AMBULATORY_CARE_PROVIDER_SITE_OTHER): Payer: 59 | Admitting: Physician Assistant

## 2021-10-31 ENCOUNTER — Encounter: Payer: Self-pay | Admitting: Physician Assistant

## 2021-10-31 DIAGNOSIS — G43709 Chronic migraine without aura, not intractable, without status migrainosus: Secondary | ICD-10-CM

## 2021-10-31 NOTE — Progress Notes (Signed)
Established Patient Office Visit  Name: Lacey Schaefer   MRN: [id number]    DOB: 1961/02/28   Date:10/31/2021  Today's Provider: Talitha Givens, MHS, PA-C Introduced myself to the patient as a PA-C and provided education on APPs in clinical practice.         Subjective  Chief Complaint  Chief Complaint  Patient presents with   ER Follow up    Was seen in ER for a headache, left before she was given results of tests that were done yesterday.    HPI  Went to ED for headache/migraine Reports her headache started yesterday morning and intensified as the day went on States she took Tylenol but eventually went to the ED  States she went to the ED and left due to wait- had CT scan EKG, and labs She states she took a Fioricet in the morning yesterday but migraine got worse States she is feeling much better today and migraine has improved Would like to review the labs and test results from the ED today      Patient Active Problem List   Diagnosis Date Noted   Chronic migraine without aura without status migrainosus, not intractable 02/21/2021   Heart palpitations 08/24/2019   Hyperlipidemia associated with type 2 diabetes mellitus ([place]) 07/22/2019   Type 2 diabetes mellitus with hyperglycemia ([place]) 08/22/2018   Hypertension associated with diabetes ([place]) 08/22/2018   Chronic bilateral low back pain without sciatica 10/24/2016   Hand pain 08/12/2015    Past Surgical History:  Procedure Laterality Date   TOTAL ABDOMINAL HYSTERECTOMY      Family History  Problem Relation Age of Onset   Diabetes Mother    Prostate cancer Father    Heart disease Sister    Prostate cancer Brother    Prostate cancer Maternal Grandfather    Prostate cancer Brother     Social History   Tobacco Use   Smoking status: Former   Smokeless tobacco: Never  Substance Use Topics   Alcohol use: No     Current Outpatient Medications:    albuterol (VENTOLIN HFA) 108 (90 Base) MCG/ACT  inhaler, Inhale 2 puffs into the lungs every 6 (six) hours as needed for wheezing or shortness of breath., Disp: 18 g, Rfl: 1   amLODipine (NORVASC) 10 MG tablet, TAKE 1 TABLET BY MOUTH EVERY DAY, Disp: 90 tablet, Rfl: 0   aspirin 81 MG EC tablet, Take by mouth daily. , Disp: , Rfl:    butalbital-acetaminophen-caffeine (FIORICET) 50-325-40 MG tablet, TAKE 1 TABLET BY MOUTH EVERY 6 HOURS AS NEEDED FOR HEADACHE, Disp: 14 tablet, Rfl: 0   carvedilol (COREG) 3.125 MG tablet, Take 3.125 mg by mouth 2 (two) times daily with a meal., Disp: , Rfl:    hydrALAZINE (APRESOLINE) 50 MG tablet, Take 100 mg by mouth 2 (two) times daily., Disp: , Rfl:    hydrochlorothiazide (HYDRODIURIL) 25 MG tablet, TAKE 1 TABLET (25 MG TOTAL) BY MOUTH DAILY. (Patient taking differently: Take 12.5 mg by mouth daily.), Disp: 90 tablet, Rfl: 0   PATADAY 0.7 % SOLN, Apply to eye., Disp: , Rfl:    potassium chloride SA (KLOR-CON M) 20 MEQ tablet, Take 1 tablet (20 mEq total) by mouth daily., Disp: 30 tablet, Rfl: 0   pravastatin (PRAVACHOL) 40 MG tablet, TAKE 1 TABLET BY MOUTH EVERY DAY, Disp: 90 tablet, Rfl: 1   sitaGLIPtin-metformin (JANUMET) 50-1000 MG tablet, Take 1 tablet by mouth 2 (two) times daily.,  Disp: 90 tablet, Rfl: 1  Allergies  Allergen Reactions   Sulfasalazine Hives   Elemental Sulfur Other (See Comments)   Metoprolol Other (See Comments) and Cough   Sulfa Antibiotics Hives   Lisinopril Cough    I personally reviewed active problem list, medication list, allergies, notes from last encounter, lab results with the patient/caregiver today.   Review of Systems  Constitutional:  Negative for chills, diaphoresis and fever.  HENT:  Negative for hearing loss and tinnitus.   Eyes:  Negative for blurred vision and double vision.  Gastrointestinal:  Negative for diarrhea, nausea and vomiting.  Musculoskeletal:  Negative for back pain and neck pain.  Neurological:  Positive for headaches. Negative for dizziness,  tingling, tremors, speech change, loss of consciousness and weakness.      Objective  Vitals:   10/31/21 1550 10/31/21 1554  BP: (!) 158/80 129/77  Pulse: 79 81  Temp: 99.4 F (37.4 C)   TempSrc: Oral   SpO2: 98%   Weight: 184 lb 3.2 oz (83.6 kg)   Height: 5' 3.86" (1.622 m)     Body mass index is 31.76 kg/m.  Physical Exam Vitals reviewed.  Constitutional:      General: She is awake.     Appearance: Normal appearance. She is well-developed and well-groomed.  Cardiovascular:     Rate and Rhythm: Normal rate and regular rhythm.     Pulses: Normal pulses.     Heart sounds: Normal heart sounds.  Pulmonary:     Effort: Pulmonary effort is normal.     Breath sounds: Normal breath sounds.  Musculoskeletal:     Right lower leg: No edema.     Left lower leg: No edema.  Neurological:     General: No focal deficit present.     Mental Status: She is alert and oriented to person, place, and time.     GCS: GCS eye subscore is 4. GCS verbal subscore is 5. GCS motor subscore is 6.     Cranial Nerves: Cranial nerves 2-12 are intact. No cranial nerve deficit, dysarthria or facial asymmetry.     Motor: No weakness, tremor or abnormal muscle tone.     Gait: Gait is intact.  Psychiatric:        Behavior: Behavior is cooperative.      Recent Results (from the past 2160 hour(s))  Comp Met (CMET)     Status: Abnormal   Collection Time: 08/07/21  4:13 PM  Result Value Ref Range   Glucose 204 (H) 70 - 99 mg/dL   BUN 11 8 - 27 mg/dL   Creatinine, Ser 0.71 0.57 - 1.00 mg/dL   eGFR 96 >59 mL/min/1.73   BUN/Creatinine Ratio 15 12 - 28   Sodium 139 134 - 144 mmol/L   Potassium 3.4 (L) 3.5 - 5.2 mmol/L   Chloride 99 96 - 106 mmol/L   CO2 25 20 - 29 mmol/L   Calcium 9.7 8.7 - 10.3 mg/dL   Total Protein 6.7 6.0 - 8.5 g/dL   Albumin 4.2 3.8 - 4.8 g/dL   Globulin, Total 2.5 1.5 - 4.5 g/dL   Albumin/Globulin Ratio 1.7 1.2 - 2.2   Bilirubin Total <0.2 0.0 - 1.2 mg/dL   Alkaline  Phosphatase 75 44 - 121 IU/L   AST 11 0 - 40 IU/L   ALT 16 0 - 32 IU/L  Lipid Profile     Status: Abnormal   Collection Time: 08/07/21  4:13 PM  Result Value Ref Range  Cholesterol, Total 211 (H) 100 - 199 mg/dL   Triglycerides 246 (H) 0 - 149 mg/dL   HDL 52 >39 mg/dL   VLDL Cholesterol Cal 43 (H) 5 - 40 mg/dL   LDL Chol Calc (NIH) 116 (H) 0 - 99 mg/dL   Chol/HDL Ratio 4.1 0.0 - 4.4 ratio    Comment:                                   T. Chol/HDL Ratio                                             Men  Women                               1/2 Avg.Risk  3.4    3.3                                   Avg.Risk  5.0    4.4                                2X Avg.Risk  9.6    7.1                                3X Avg.Risk 23.4   11.0   HgB A1c     Status: Abnormal   Collection Time: 08/07/21  4:13 PM  Result Value Ref Range   Hgb A1c MFr Bld 8.9 (H) 4.8 - 5.6 %    Comment:          Prediabetes: 5.7 - 6.4          Diabetes: >6.4          Glycemic control for adults with diabetes: <7.0    Est. average glucose Bld gHb Est-mCnc 209 mg/dL  Protime-INR     Status: None   Collection Time: 10/30/21  3:27 PM  Result Value Ref Range   Prothrombin Time 13.0 11.4 - 15.2 seconds   INR 1.0 0.8 - 1.2    Comment: (NOTE) INR goal varies based on device and disease states. Performed at [facility], [address] [place], [place] [address]   APTT     Status: None   Collection Time: 10/30/21  3:27 PM  Result Value Ref Range   aPTT 34 24 - 36 seconds    Comment: Performed at [facility], [address] [place], [place] [address]  CBC     Status: None   Collection Time: 10/30/21  3:27 PM  Result Value Ref Range   WBC 6.3 4.0 - 10.5 K/uL   RBC 4.58 3.87 - 5.11 MIL/uL   Hemoglobin 12.0 12.0 - 15.0 g/dL   HCT 38.9 36.0 - 46.0 %   MCV 84.9 80.0 - 100.0 fL   MCH 26.2 26.0 - 34.0 pg   MCHC 30.8 30.0 - 36.0 g/dL   RDW 14.4 11.5 - 15.5 %   Platelets 345 150 - 400 K/uL   nRBC 0.0  0.0 - 0.2 %  Comment: Performed at [facility], [address] [place], [place] [address]  Differential     Status: None   Collection Time: 10/30/21  3:27 PM  Result Value Ref Range   Neutrophils Relative % 57 %   Neutro Abs 3.6 1.7 - 7.7 K/uL   Lymphocytes Relative 34 %   Lymphs Abs 2.2 0.7 - 4.0 K/uL   Monocytes Relative 6 %   Monocytes Absolute 0.4 0.1 - 1.0 K/uL   Eosinophils Relative 2 %   Eosinophils Absolute 0.1 0.0 - 0.5 K/uL   Basophils Relative 1 %   Basophils Absolute 0.1 0.0 - 0.1 K/uL   Immature Granulocytes 0 %   Abs Immature Granulocytes 0.02 0.00 - 0.07 K/uL    Comment: Performed at [facility], [address] [place], [place] [address]  Comprehensive metabolic panel     Status: Abnormal   Collection Time: 10/30/21  3:27 PM  Result Value Ref Range   Sodium 139 135 - 145 mmol/L   Potassium 3.0 (L) 3.5 - 5.1 mmol/L   Chloride 104 98 - 111 mmol/L   CO2 26 22 - 32 mmol/L   Glucose, Bld 152 (H) 70 - 99 mg/dL    Comment: Glucose reference range applies only to samples taken after fasting for at least 8 hours.   BUN 17 8 - 23 mg/dL   Creatinine, Ser 0.76 0.44 - 1.00 mg/dL   Calcium 9.2 8.9 - 10.3 mg/dL   Total Protein 7.3 6.5 - 8.1 g/dL   Albumin 4.0 3.5 - 5.0 g/dL   AST 14 (L) 15 - 41 U/L   ALT 17 0 - 44 U/L   Alkaline Phosphatase 61 38 - 126 U/L   Total Bilirubin 0.2 (L) 0.3 - 1.2 mg/dL   GFR, Estimated >60 >60 mL/min    Comment: (NOTE) Calculated using the CKD-EPI Creatinine Equation (2021)    Anion gap 9 5 - 15    Comment: Performed at [facility], [address], [place], [place] [address]  Ethanol     Status: None   Collection Time: 10/30/21  3:27 PM  Result Value Ref Range   Alcohol, Ethyl (B) <10 <10 mg/dL    Comment: (NOTE) Lowest detectable limit for serum alcohol is 10 mg/dL.  For medical purposes only. Performed at [facility], [address] [place], [place] [address]      PHQ2/9:     10/31/2021    4:06 PM 08/07/2021    4:09 PM 03/30/2021   11:06 AM 02/21/2021   11:53 AM 12/01/2020   11:30 AM  Depression screen PHQ 2/9  Decreased Interest 0 0 1 0 0  Down, Depressed, Hopeless 0 0 1 0 1  PHQ - 2 Score 0 0 2 0 1  Altered sleeping 0 0 0 1   Tired, decreased energy 0 0 1 1   Change in appetite 0 0 0 3   Feeling bad or failure about yourself  0 0 0 0   Trouble concentrating 0 0 0 0   Moving slowly or fidgety/restless 0 0 0 0   Suicidal thoughts 0 0 0 0   PHQ-9 Score 0 0 3 5   Difficult doing work/chores Not difficult at all Not difficult at all Not difficult at all        Fall Risk:    10/31/2021    4:06 PM 08/07/2021    4:09 PM 03/30/2021   11:06 AM 07/22/2019   10:11  AM  Fall Risk   Falls in the past year? 0 1 0 0  Number falls in past yr: 0 0 0 0  Injury with Fall? 0 0 0 0  Risk for fall due to : No Fall Risks History of fall(s) No Fall Risks   Follow up Falls evaluation completed Falls evaluation completed Falls evaluation completed       Functional Status Survey:      Assessment & Plan  Problem List Items Addressed This Visit       Cardiovascular and Mediastinum   Chronic migraine without aura without status migrainosus, not intractable    Chronic, appears to have had an acute exacerbation yesterday  She went to ED for headache pain but left before she was fully evaluated CT was normal, labs were reassuring except for hypokalemia- reviewed using potassium supplements for correction  Reviewed ED precautions for headaches Reviewed managing diabetes to prevent glucose spikes and lows Reviewed migraine management with Fioricet, avoiding triggers, and staying hydrated Neuro exam in office was benign  Follow up as needed for migraine exacerbations         No follow-ups on file.   I, Brookie Wayment E Kristalyn Bergstresser, PA-C, have reviewed all documentation for this visit. The documentation on 10/31/21 for the exam, diagnosis, procedures, and orders are all accurate and  complete.   Talitha Givens, MHS, PA-C [facility]

## 2021-10-31 NOTE — Patient Instructions (Addendum)
Your CT scan was normal from the ED Remember to stay well hydrated and manage your diabetes as directed: diabetes conscious meals and snacks to prevent low blood sugar and spikes Use your Fioricet as needed during a migraine headache  Remember to take your potassium supplements as this was a bit low in the ED   If it feels different from your normal headaches, come on suddenly, is the worse headache of your life, is accompanied by nausea, vomiting, loss of consciousness, fever, confusion please return to the ED

## 2021-10-31 NOTE — Assessment & Plan Note (Addendum)
Chronic, appears to have had an acute exacerbation yesterday  She went to ED for headache pain but left before she was fully evaluated CT was normal, labs were reassuring except for hypokalemia- reviewed using potassium supplements for correction  Reviewed ED precautions for headaches Reviewed managing diabetes to prevent glucose spikes and lows Reviewed migraine management with Fioricet, avoiding triggers, and staying hydrated Neuro exam in office was benign  Follow up as needed for migraine exacerbations

## 2021-11-03 ENCOUNTER — Other Ambulatory Visit: Payer: Self-pay | Admitting: Student

## 2021-11-03 ENCOUNTER — Other Ambulatory Visit (HOSPITAL_COMMUNITY): Payer: Self-pay | Admitting: Student

## 2021-11-09 DIAGNOSIS — M25511 Pain in right shoulder: Secondary | ICD-10-CM | POA: Diagnosis not present

## 2021-11-10 ENCOUNTER — Ambulatory Visit: Payer: 59 | Admitting: Nurse Practitioner

## 2021-11-13 ENCOUNTER — Other Ambulatory Visit: Payer: Self-pay | Admitting: Student

## 2021-11-13 DIAGNOSIS — R079 Chest pain, unspecified: Secondary | ICD-10-CM

## 2021-11-15 ENCOUNTER — Other Ambulatory Visit (HOSPITAL_COMMUNITY): Payer: Self-pay | Admitting: *Deleted

## 2021-11-15 ENCOUNTER — Telehealth (HOSPITAL_COMMUNITY): Payer: Self-pay | Admitting: *Deleted

## 2021-11-15 MED ORDER — IVABRADINE HCL 5 MG PO TABS: ORAL_TABLET | ORAL | 0 refills | Status: DC

## 2021-11-15 MED ORDER — DILTIAZEM HCL 60 MG PO TABS: ORAL_TABLET | ORAL | 0 refills | Status: DC

## 2021-11-15 NOTE — Telephone Encounter (Signed)
Reaching out to patient to offer assistance regarding upcoming cardiac imaging study; pt verbalizes understanding of appt date/time, parking situation and where to check in, pre-test NPO status and medications ordered, and verified current allergies; name and call back number provided for further questions should they arise  Lacey Clement RN Navigator Cardiac Imaging [facility] [phone] office [phone] cell  Patient to take '60mg'$  diltiazem and '15mg'$  ivabradine.

## 2021-11-16 ENCOUNTER — Ambulatory Visit
Admission: RE | Admit: 2021-11-16 | Discharge: 2021-11-16 | Disposition: A | Discharge status: Home / Self Care | Payer: 59 | Source: Ambulatory Visit | Attending: Student | Admitting: Student

## 2021-11-16 DIAGNOSIS — R079 Chest pain, unspecified: Secondary | ICD-10-CM | POA: Insufficient documentation

## 2021-11-16 MED ORDER — DILTIAZEM HCL 25 MG/5ML IV SOLN
10.0000 mg | Freq: Once | INTRAVENOUS | Status: AC
  Administered 2021-11-16: 10 mg via INTRAVENOUS

## 2021-11-16 MED ORDER — IOHEXOL 350 MG/ML SOLN
100.0000 mL | Freq: Once | INTRAVENOUS | Status: AC | PRN
  Administered 2021-11-16: 100 mL via INTRAVENOUS

## 2021-11-16 MED ORDER — METOPROLOL TARTRATE 5 MG/5ML IV SOLN
10.0000 mg | Freq: Once | INTRAVENOUS | Status: AC
  Administered 2021-11-16: 10 mg via INTRAVENOUS

## 2021-11-16 MED ORDER — NITROGLYCERIN 0.4 MG SL SUBL
0.8000 mg | SUBLINGUAL_TABLET | Freq: Once | SUBLINGUAL | Status: AC
  Administered 2021-11-16: 0.8 mg via SUBLINGUAL

## 2021-11-18 ENCOUNTER — Other Ambulatory Visit: Payer: Self-pay | Admitting: Nurse Practitioner

## 2021-11-21 NOTE — Telephone Encounter (Signed)
Requested medication (s) are due for refill today: yes  Requested medication (s) are on the active medication list: yes    Last refill: 10/25/21  #14 0 refills  Future visit scheduled no  Notes to clinic:Not delegated, please review. Thank you.  Requested Prescriptions  Pending Prescriptions Disp Refills   butalbital-acetaminophen-caffeine (FIORICET) 50-325-40 MG tablet [Pharmacy Med Name: Butalbital-APAP-Caffeine 50-325-40 MG Oral Tablet] 14 tablet 0    Sig: TAKE 1 TABLET BY MOUTH EVERY 6 HOURS AS NEEDED FOR HEADACHE     Not Delegated - Analgesics:  Non-Opioid Analgesic Combinations 2 Failed - 11/18/2021  5:59 PM      Failed - This refill cannot be delegated      Passed - Cr in normal range and within 360 days    Creatinine, Ser  Date Value Ref Range Status  10/30/2021 0.76 0.44 - 1.00 mg/dL Final         Passed - eGFR is 10 or above and within 360 days    GFR calc Af Amer  Date Value Ref Range Status  04/22/2020 106 >59 mL/min/1.73 Final    Comment:    **In accordance with recommendations from the NKF-ASN Task force,**   [facility] is in the process of updating its eGFR calculation to the   2021 CKD-EPI creatinine equation that estimates kidney function   without a race variable.    GFR, Estimated  Date Value Ref Range Status  10/30/2021 >60 >60 mL/min Final    Comment:    (NOTE) Calculated using the CKD-EPI Creatinine Equation (2021)    eGFR  Date Value Ref Range Status  08/07/2021 96 >59 mL/min/1.73 Final         Passed - Patient is not pregnant      Passed - Valid encounter within last 12 months    Recent Outpatient Visits           3 weeks ago Chronic migraine without aura without status migrainosus, not intractable   [facility] Mecum, [name] E, PA-C   3 months ago Hyperlipidemia associated with type 2 diabetes mellitus ([place])   [place], [name], NP   7 months ago Type 2 diabetes mellitus with hyperglycemia, without  long-term current use of insulin ([place])   [facility] Jon Billings, NP   9 months ago Acute left-sided thoracic back pain   [place], NP   11 months ago Hypertension associated with diabetes [facility])   [facility] Jon Billings, NP

## 2021-11-21 NOTE — Telephone Encounter (Signed)
Can we see if patient actually needs this refilled?

## 2021-11-22 NOTE — Telephone Encounter (Signed)
Called and spoke with patient, states she is completely out of Fioricet.

## 2021-12-14 ENCOUNTER — Other Ambulatory Visit: Payer: Self-pay | Admitting: Nurse Practitioner

## 2021-12-14 MED ORDER — JANUMET 50-1000 MG PO TABS: 1.0000 | ORAL_TABLET | Freq: Two times a day (BID) | ORAL | 0 refills | Status: DC

## 2021-12-14 NOTE — Telephone Encounter (Signed)
Called patient to review medication requests. No answer, mailbox full unable to leave message.

## 2021-12-14 NOTE — Telephone Encounter (Signed)
Requested medication (s) are due for refill today: historical medication  Requested medication (s) are on the active medication list: yes   Last refill:  na   Future visit scheduled: no  Notes to clinic:  historical medication. Patient reports out of medication. Do you want to order Rx?  Regarding Janumet Rx refilled for #60  0 refills until PCP can review request.    Requested Prescriptions  Pending Prescriptions Disp Refills   carvedilol (COREG) 3.125 MG tablet      Sig: Take 1 tablet (3.125 mg total) by mouth 2 (two) times daily with a meal.     Cardiovascular: Beta Blockers 3 Failed - 12/14/2021  4:38 PM      Failed - AST in normal range and within 360 days    AST  Date Value Ref Range Status  10/30/2021 14 (L) 15 - 41 U/L Final         Failed - Last BP in normal range    BP Readings from Last 1 Encounters:  11/16/21 (!) 145/80         Passed - Cr in normal range and within 360 days    Creatinine, Ser  Date Value Ref Range Status  10/30/2021 0.76 0.44 - 1.00 mg/dL Final         Passed - ALT in normal range and within 360 days    ALT  Date Value Ref Range Status  10/30/2021 17 0 - 44 U/L Final         Passed - Last Heart Rate in normal range    Pulse Readings from Last 1 Encounters:  11/16/21 64         Passed - Valid encounter within last 6 months    Recent Outpatient Visits           1 month ago Chronic migraine without aura without status migrainosus, not intractable   [facility] Mecum, [name] E, PA-C   4 months ago Hyperlipidemia associated with type 2 diabetes mellitus ([place])   [place], [name], NP   8 months ago Type 2 diabetes mellitus with hyperglycemia, without long-term current use of insulin ([place])   [facility] Jon Billings, NP   9 months ago Acute left-sided thoracic back pain   [facility] Jon Billings, NP   1 year ago Hypertension associated with diabetes [facility])    [facility] Jon Billings, NP              Signed Prescriptions Disp Refills   sitaGLIPtin-metformin (JANUMET) 50-1000 MG tablet 60 tablet 0    Sig: Take 1 tablet by mouth 2 (two) times daily.     Endocrinology:  Diabetes - Biguanide + DPP-4 Inhibitor Combos Failed - 12/14/2021  4:38 PM      Failed - HBA1C is between 0 and 7.9 and within 180 days    HB A1C (BAYER DCA - WAIVED)  Date Value Ref Range Status  04/22/2020 7.6 (H) <7.0 % Final    Comment:                                          Diabetic Adult            <7.0  Healthy Adult        4.3 - 5.7                                                           (DCCT/NGSP) American Diabetes Association's Summary of Glycemic Recommendations for Adults with Diabetes: Hemoglobin A1c <7.0%. More stringent glycemic goals (A1c <6.0%) may further reduce complications at the cost of increased risk of hypoglycemia.    Hgb A1c MFr Bld  Date Value Ref Range Status  08/07/2021 8.9 (H) 4.8 - 5.6 % Final    Comment:             Prediabetes: 5.7 - 6.4          Diabetes: >6.4          Glycemic control for adults with diabetes: <7.0          Failed - B12 Level in normal range and within 720 days    No results found for: "VITAMINB12"       Failed - CBC within normal limits and completed in the last 12 months    WBC  Date Value Ref Range Status  10/30/2021 6.3 4.0 - 10.5 K/uL Final   RBC  Date Value Ref Range Status  10/30/2021 4.58 3.87 - 5.11 MIL/uL Final   Hemoglobin  Date Value Ref Range Status  10/30/2021 12.0 12.0 - 15.0 g/dL Final  01/18/2020 12.3 11.1 - 15.9 g/dL Final   HCT  Date Value Ref Range Status  10/30/2021 38.9 36.0 - 46.0 % Final   Hematocrit  Date Value Ref Range Status  01/18/2020 38.5 34.0 - 46.6 % Final   MCHC  Date Value Ref Range Status  10/30/2021 30.8 30.0 - 36.0 g/dL Final   MCH  Date Value Ref Range Status  10/30/2021 26.2 26.0 - 34.0 pg  Final   MCV  Date Value Ref Range Status  10/30/2021 84.9 80.0 - 100.0 fL Final  01/18/2020 84 79 - 97 fL Final   No results found for: "PLTCOUNTKUC", "LABPLAT", "POCPLA" RDW  Date Value Ref Range Status  10/30/2021 14.4 11.5 - 15.5 % Final  01/18/2020 13.9 11.7 - 15.4 % Final         Passed - Cr in normal range and within 360 days    Creatinine, Ser  Date Value Ref Range Status  10/30/2021 0.76 0.44 - 1.00 mg/dL Final         Passed - eGFR in normal range and within 360 days    GFR calc Af Amer  Date Value Ref Range Status  04/22/2020 106 >59 mL/min/1.73 Final    Comment:    **In accordance with recommendations from the NKF-ASN Task force,**   [facility] is in the process of updating its eGFR calculation to the   2021 CKD-EPI creatinine equation that estimates kidney function   without a race variable.    GFR, Estimated  Date Value Ref Range Status  10/30/2021 >60 >60 mL/min Final    Comment:    (NOTE) Calculated using the CKD-EPI Creatinine Equation (2021)    eGFR  Date Value Ref Range Status  08/07/2021 96 >59 mL/min/1.73 Final         Passed - Valid encounter within last 6 months    Recent Outpatient Visits             1 month ago Chronic migraine without aura without status migrainosus, not intractable   [facility] Mecum, [name] E, PA-C   4 months ago Hyperlipidemia associated with type 2 diabetes mellitus (HCC)   [facility] Hold[place], Karen, NP   8 months ago Type 2 diabetes mellitus with hyperglycemia, without long-term current use of insulin (HCC)   [facility] Holdsworth, Karen, NP   9 months ago Acute left-sided thoracic back pain   [facility] Holdsworth, Karen, NP   1 year ago Hypertension associated with diabetes (HCC)   [facility] Holdsworth, Karen, NP               

## 2021-12-14 NOTE — Telephone Encounter (Signed)
Requested medication (s) are due for refill today: yes  Requested medication (s) are on the active medication list: yes  Last refill:    Future visit scheduled: no  Notes to clinic:  historical med. Please review for refill     Requested Prescriptions  Pending Prescriptions Disp Refills   carvedilol (COREG) 3.125 MG tablet      Sig: Take 1 tablet (3.125 mg total) by mouth 2 (two) times daily with a meal.     Cardiovascular: Beta Blockers 3 Failed - 12/14/2021  5:28 PM      Failed - AST in normal range and within 360 days    AST  Date Value Ref Range Status  10/30/2021 14 (L) 15 - 41 U/L Final         Failed - Last BP in normal range    BP Readings from Last 1 Encounters:  11/16/21 (!) 145/80         Passed - Cr in normal range and within 360 days    Creatinine, Ser  Date Value Ref Range Status  10/30/2021 0.76 0.44 - 1.00 mg/dL Final         Passed - ALT in normal range and within 360 days    ALT  Date Value Ref Range Status  10/30/2021 17 0 - 44 U/L Final         Passed - Last Heart Rate in normal range    Pulse Readings from Last 1 Encounters:  11/16/21 64         Passed - Valid encounter within last 6 months    Recent Outpatient Visits           1 month ago Chronic migraine without aura without status migrainosus, not intractable   [facility] Mecum, [name] E, PA-C   4 months ago Hyperlipidemia associated with type 2 diabetes mellitus ([place])   [place], [name], NP   8 months ago Type 2 diabetes mellitus with hyperglycemia, without long-term current use of insulin ([place])   [facility] Jon Billings, NP   9 months ago Acute left-sided thoracic back pain   [place] [facility] Jon Billings, NP   1 year ago Hypertension associated with diabetes [facility])   [facility] Jon Billings, NP              Signed Prescriptions Disp Refills   sitaGLIPtin-metformin (JANUMET) 50-1000 MG  tablet 60 tablet 0    Sig: Take 1 tablet by mouth 2 (two) times daily.     Endocrinology:  Diabetes - Biguanide + DPP-4 Inhibitor Combos Failed - 12/14/2021  4:38 PM      Failed - HBA1C is between 0 and 7.9 and within 180 days    HB A1C (BAYER DCA - WAIVED)  Date Value Ref Range Status  04/22/2020 7.6 (H) <7.0 % Final    Comment:                                          Diabetic Adult            <7.0                                       Healthy Adult        4.3 -  5.7                                                           (DCCT/NGSP) American Diabetes Association's Summary of Glycemic Recommendations for Adults with Diabetes: Hemoglobin A1c <7.0%. More stringent glycemic goals (A1c <6.0%) may further reduce complications at the cost of increased risk of hypoglycemia.    Hgb A1c MFr Bld  Date Value Ref Range Status  08/07/2021 8.9 (H) 4.8 - 5.6 % Final    Comment:             Prediabetes: 5.7 - 6.4          Diabetes: >6.4          Glycemic control for adults with diabetes: <7.0          Failed - B12 Level in normal range and within 720 days    No results found for: "VITAMINB12"       Failed - CBC within normal limits and completed in the last 12 months    WBC  Date Value Ref Range Status  10/30/2021 6.3 4.0 - 10.5 K/uL Final   RBC  Date Value Ref Range Status  10/30/2021 4.58 3.87 - 5.11 MIL/uL Final   Hemoglobin  Date Value Ref Range Status  10/30/2021 12.0 12.0 - 15.0 g/dL Final  01/18/2020 12.3 11.1 - 15.9 g/dL Final   HCT  Date Value Ref Range Status  10/30/2021 38.9 36.0 - 46.0 % Final   Hematocrit  Date Value Ref Range Status  01/18/2020 38.5 34.0 - 46.6 % Final   MCHC  Date Value Ref Range Status  10/30/2021 30.8 30.0 - 36.0 g/dL Final   [facility]  Date Value Ref Range Status  10/30/2021 26.2 26.0 - 34.0 pg Final   MCV  Date Value Ref Range Status  10/30/2021 84.9 80.0 - 100.0 fL Final  01/18/2020 84 79 - 97 fL Final   No results found for:  "PLTCOUNTKUC", "LABPLAT", "POCPLA" RDW  Date Value Ref Range Status  10/30/2021 14.4 11.5 - 15.5 % Final  01/18/2020 13.9 11.7 - 15.4 % Final         Passed - Cr in normal range and within 360 days    Creatinine, Ser  Date Value Ref Range Status  10/30/2021 0.76 0.44 - 1.00 mg/dL Final         Passed - eGFR in normal range and within 360 days    GFR calc Af Amer  Date Value Ref Range Status  04/22/2020 106 >59 mL/min/1.73 Final    Comment:    **In accordance with recommendations from the NKF-ASN Task force,**   [facility] is in the process of updating its eGFR calculation to the   2021 CKD-EPI creatinine equation that estimates kidney function   without a race variable.    GFR, Estimated  Date Value Ref Range Status  10/30/2021 >60 >60 mL/min Final    Comment:    (NOTE) Calculated using the CKD-EPI Creatinine Equation (2021)    eGFR  Date Value Ref Range Status  08/07/2021 96 >59 mL/min/1.73 Final         Passed - Valid encounter within last 6 months    Recent Outpatient Visits           1 month ago Chronic migraine without  aura without status migrainosus, not intractable   [facility] Mecum, [name] E, PA-C   4 months ago Hyperlipidemia associated with type 2 diabetes mellitus ([place])   [facility] Jon Billings, NP   8 months ago Type 2 diabetes mellitus with hyperglycemia, without long-term current use of insulin ([place])   [facility] Jon Billings, NP   9 months ago Acute left-sided thoracic back pain   [facility] Jon Billings, NP   1 year ago Hypertension associated with diabetes [facility])   [facility] Jon Billings, NP

## 2021-12-14 NOTE — Telephone Encounter (Signed)
Requested medication (s) are due for refill today: historical med- coreg  Requested medication (s) are on the active medication list: yes   Last refill:   coreg- historical medication, janumet 08/07/21 #90 1 refills  Future visit scheduled: no  Notes to clinic:  out of current supply. Janumet ordered 2 times daily can patient get #180 instead of #90. Historical medication - coreg, do you want to order medication? Please advise      Requested Prescriptions  Pending Prescriptions Disp Refills   carvedilol (COREG) 3.125 MG tablet      Sig: Take 1 tablet (3.125 mg total) by mouth 2 (two) times daily with a meal.     Cardiovascular: Beta Blockers 3 Failed - 12/14/2021  2:17 PM      Failed - AST in normal range and within 360 days    AST  Date Value Ref Range Status  10/30/2021 14 (L) 15 - 41 U/L Final         Failed - Last BP in normal range    BP Readings from Last 1 Encounters:  11/16/21 (!) 145/80         Passed - Cr in normal range and within 360 days    Creatinine, Ser  Date Value Ref Range Status  10/30/2021 0.76 0.44 - 1.00 mg/dL Final         Passed - ALT in normal range and within 360 days    ALT  Date Value Ref Range Status  10/30/2021 17 0 - 44 U/L Final         Passed - Last Heart Rate in normal range    Pulse Readings from Last 1 Encounters:  11/16/21 64         Passed - Valid encounter within last 6 months    Recent Outpatient Visits           1 month ago Chronic migraine without aura without status migrainosus, not intractable   [facility] Mecum, [name] E, PA-C   4 months ago Hyperlipidemia associated with type 2 diabetes mellitus ([place])   [place], [name], NP   8 months ago Type 2 diabetes mellitus with hyperglycemia, without long-term current use of insulin ([place])   [facility] Jon Billings, NP   9 months ago Acute left-sided thoracic back pain   [facility] Jon Billings, NP    1 year ago Hypertension associated with diabetes [facility])   [facility] Jon Billings, NP               sitaGLIPtin-metformin (JANUMET) 50-1000 MG tablet 90 tablet 1    Sig: Take 1 tablet by mouth 2 (two) times daily.     Endocrinology:  Diabetes - Biguanide + DPP-4 Inhibitor Combos Failed - 12/14/2021  2:17 PM      Failed - HBA1C is between 0 and 7.9 and within 180 days    HB A1C (BAYER DCA - WAIVED)  Date Value Ref Range Status  04/22/2020 7.6 (H) <7.0 % Final    Comment:                                          Diabetic Adult            <7.0  Healthy Adult        4.3 - 5.7                                                           (DCCT/NGSP) American Diabetes Association's Summary of Glycemic Recommendations for Adults with Diabetes: Hemoglobin A1c <7.0%. More stringent glycemic goals (A1c <6.0%) may further reduce complications at the cost of increased risk of hypoglycemia.    Hgb A1c MFr Bld  Date Value Ref Range Status  08/07/2021 8.9 (H) 4.8 - 5.6 % Final    Comment:             Prediabetes: 5.7 - 6.4          Diabetes: >6.4          Glycemic control for adults with diabetes: <7.0          Failed - B12 Level in normal range and within 720 days    No results found for: "VITAMINB12"       Failed - CBC within normal limits and completed in the last 12 months    WBC  Date Value Ref Range Status  10/30/2021 6.3 4.0 - 10.5 K/uL Final   RBC  Date Value Ref Range Status  10/30/2021 4.58 3.87 - 5.11 MIL/uL Final   Hemoglobin  Date Value Ref Range Status  10/30/2021 12.0 12.0 - 15.0 g/dL Final  01/18/2020 12.3 11.1 - 15.9 g/dL Final   HCT  Date Value Ref Range Status  10/30/2021 38.9 36.0 - 46.0 % Final   Hematocrit  Date Value Ref Range Status  01/18/2020 38.5 34.0 - 46.6 % Final   MCHC  Date Value Ref Range Status  10/30/2021 30.8 30.0 - 36.0 g/dL Final   [facility]  Date Value Ref Range Status  10/30/2021  26.2 26.0 - 34.0 pg Final   MCV  Date Value Ref Range Status  10/30/2021 84.9 80.0 - 100.0 fL Final  01/18/2020 84 79 - 97 fL Final   No results found for: "PLTCOUNTKUC", "LABPLAT", "POCPLA" RDW  Date Value Ref Range Status  10/30/2021 14.4 11.5 - 15.5 % Final  01/18/2020 13.9 11.7 - 15.4 % Final         Passed - Cr in normal range and within 360 days    Creatinine, Ser  Date Value Ref Range Status  10/30/2021 0.76 0.44 - 1.00 mg/dL Final         Passed - eGFR in normal range and within 360 days    GFR calc Af Amer  Date Value Ref Range Status  04/22/2020 106 >59 mL/min/1.73 Final    Comment:    **In accordance with recommendations from the NKF-ASN Task force,**   [facility] is in the process of updating its eGFR calculation to the   2021 CKD-EPI creatinine equation that estimates kidney function   without a race variable.    GFR, Estimated  Date Value Ref Range Status  10/30/2021 >60 >60 mL/min Final    Comment:    (NOTE) Calculated using the CKD-EPI Creatinine Equation (2021)    eGFR  Date Value Ref Range Status  08/07/2021 96 >59 mL/min/1.73 Final         Passed - Valid encounter within last 6 months    Recent Outpatient Visits  1 month ago Chronic migraine without aura without status migrainosus, not intractable   [facility] Mecum, [name] E, PA-C   4 months ago Hyperlipidemia associated with type 2 diabetes mellitus (HCC)   [facility] [place], Karen, NP   8 months ago Type 2 diabetes mellitus with hyperglycemia, without long-term current use of insulin (HCC)   [facility] [place], Karen, NP   9 months ago Acute left-sided thoracic back pain   [facility] Hold[place], Karen, NP   1 year ago Hypertension associated with diabetes (HCC)   [facility] Holdsworth, Karen, NP               

## 2021-12-14 NOTE — Telephone Encounter (Signed)
Pt called to request that someone submits this for her soon. She says she needs to pick this up because she is completely out.

## 2021-12-14 NOTE — Telephone Encounter (Signed)
Patient out of medication .  Requested Prescriptions  Pending Prescriptions Disp Refills  . carvedilol (COREG) 3.125 MG tablet      Sig: Take 1 tablet (3.125 mg total) by mouth 2 (two) times daily with a meal.     Cardiovascular: Beta Blockers 3 Failed - 12/14/2021  4:38 PM      Failed - AST in normal range and within 360 days    AST  Date Value Ref Range Status  10/30/2021 14 (L) 15 - 41 U/L Final         Failed - Last BP in normal range    BP Readings from Last 1 Encounters:  11/16/21 (!) 145/80         Passed - Cr in normal range and within 360 days    Creatinine, Ser  Date Value Ref Range Status  10/30/2021 0.76 0.44 - 1.00 mg/dL Final         Passed - ALT in normal range and within 360 days    ALT  Date Value Ref Range Status  10/30/2021 17 0 - 44 U/L Final         Passed - Last Heart Rate in normal range    Pulse Readings from Last 1 Encounters:  11/16/21 64         Passed - Valid encounter within last 6 months    Recent Outpatient Visits          1 month ago Chronic migraine without aura without status migrainosus, not intractable   [facility] Mecum, [name] E, PA-C   4 months ago Hyperlipidemia associated with type 2 diabetes mellitus ([place])   [place], [name], NP   8 months ago Type 2 diabetes mellitus with hyperglycemia, without long-term current use of insulin ([place])   [facility] Jon Billings, NP   9 months ago Acute left-sided thoracic back pain   [facility] Jon Billings, NP   1 year ago Hypertension associated with diabetes ([place])   [facility] Jon Billings, NP             . sitaGLIPtin-metformin (JANUMET) 50-1000 MG tablet 60 tablet 0    Sig: Take 1 tablet by mouth 2 (two) times daily.     Endocrinology:  Diabetes - Biguanide + DPP-4 Inhibitor Combos Failed - 12/14/2021  4:38 PM      Failed - HBA1C is between 0 and 7.9 and within 180 days    HB A1C (BAYER  DCA - WAIVED)  Date Value Ref Range Status  04/22/2020 7.6 (H) <7.0 % Final    Comment:                                          Diabetic Adult            <7.0                                       Healthy Adult        4.3 - 5.7                                                           (  DCCT/NGSP) American Diabetes Association's Summary of Glycemic Recommendations for Adults with Diabetes: Hemoglobin A1c <7.0%. More stringent glycemic goals (A1c <6.0%) may further reduce complications at the cost of increased risk of hypoglycemia.    Hgb A1c MFr Bld  Date Value Ref Range Status  08/07/2021 8.9 (H) 4.8 - 5.6 % Final    Comment:             Prediabetes: 5.7 - 6.4          Diabetes: >6.4          Glycemic control for adults with diabetes: <7.0          Failed - B12 Level in normal range and within 720 days    No results found for: "VITAMINB12"       Failed - CBC within normal limits and completed in the last 12 months    WBC  Date Value Ref Range Status  10/30/2021 6.3 4.0 - 10.5 K/uL Final   RBC  Date Value Ref Range Status  10/30/2021 4.58 3.87 - 5.11 MIL/uL Final   Hemoglobin  Date Value Ref Range Status  10/30/2021 12.0 12.0 - 15.0 g/dL Final  01/18/2020 12.3 11.1 - 15.9 g/dL Final   HCT  Date Value Ref Range Status  10/30/2021 38.9 36.0 - 46.0 % Final   Hematocrit  Date Value Ref Range Status  01/18/2020 38.5 34.0 - 46.6 % Final   MCHC  Date Value Ref Range Status  10/30/2021 30.8 30.0 - 36.0 g/dL Final   [facility]  Date Value Ref Range Status  10/30/2021 26.2 26.0 - 34.0 pg Final   MCV  Date Value Ref Range Status  10/30/2021 84.9 80.0 - 100.0 fL Final  01/18/2020 84 79 - 97 fL Final   No results found for: "PLTCOUNTKUC", "LABPLAT", "POCPLA" RDW  Date Value Ref Range Status  10/30/2021 14.4 11.5 - 15.5 % Final  01/18/2020 13.9 11.7 - 15.4 % Final         Passed - Cr in normal range and within 360 days    Creatinine, Ser  Date Value Ref Range Status   10/30/2021 0.76 0.44 - 1.00 mg/dL Final         Passed - eGFR in normal range and within 360 days    GFR calc Af Amer  Date Value Ref Range Status  04/22/2020 106 >59 mL/min/1.73 Final    Comment:    **In accordance with recommendations from the NKF-ASN Task force,**   [facility] is in the process of updating its eGFR calculation to the   2021 CKD-EPI creatinine equation that estimates kidney function   without a race variable.    GFR, Estimated  Date Value Ref Range Status  10/30/2021 >60 >60 mL/min Final    Comment:    (NOTE) Calculated using the CKD-EPI Creatinine Equation (2021)    eGFR  Date Value Ref Range Status  08/07/2021 96 >59 mL/min/1.73 Final         Passed - Valid encounter within last 6 months    Recent Outpatient Visits          1 month ago Chronic migraine without aura without status migrainosus, not intractable   [facility] Mecum, [name] E, PA-C   4 months ago Hyperlipidemia associated with type 2 diabetes mellitus ([place])   [facility] Jon Billings, NP   8 months ago Type 2 diabetes mellitus with hyperglycemia, without long-term current use of insulin ([place])   [place],  [name], NP   9 months ago Acute left-sided thoracic back pain   [place], NP   1 year ago Hypertension associated with diabetes [facility])   [facility] Jon Billings, NP

## 2021-12-14 NOTE — Telephone Encounter (Signed)
Pt called to report that she is completely out of her current supply.   Medication Refill - Medication: sitaGLIPtin-metformin (JANUMET) 50-1000 MG tablet  carvedilol (COREG) 3.125 MG tablet   Has the patient contacted their pharmacy? Yes.   (Agent: If no, request that the patient contact the pharmacy for the refill. If patient does not wish to contact the pharmacy document the reason why and proceed with request.) (Agent: If yes, when and what did the pharmacy advise?)  Preferred Pharmacy (with phone number or street name):  [place], [place] - [place]  [address] [place] [address]  Phone: [phone] Fax: [phone]   Has the patient been seen for an appointment in the last year OR does the patient have an upcoming appointment? Yes.    Agent: Please be advised that RX refills may take up to 3 business days. We ask that you follow-up with your pharmacy.

## 2021-12-14 NOTE — Telephone Encounter (Signed)
Pt is returning calling regarding her medication refill. Pt missed a call. Pt is calling to check on the status of her medication refill.

## 2021-12-15 MED ORDER — CARVEDILOL 3.125 MG PO TABS: 3.1250 mg | ORAL_TABLET | Freq: Two times a day (BID) | ORAL | 0 refills | Status: DC

## 2022-01-29 ENCOUNTER — Other Ambulatory Visit: Payer: Self-pay | Admitting: Nurse Practitioner

## 2022-01-29 MED ORDER — CARVEDILOL 3.125 MG PO TABS: 3.1250 mg | ORAL_TABLET | Freq: Two times a day (BID) | ORAL | 0 refills | Status: DC

## 2022-01-29 NOTE — Telephone Encounter (Signed)
Requested medication (s) are due for refill today: yes  Requested medication (s) are on the active medication list: yes  Last refill:  11/22/21 #14/0  Future visit scheduled: yes  Notes to clinic:  Unable to refill per protocol, cannot delegate.    Requested Prescriptions  Pending Prescriptions Disp Refills   butalbital-acetaminophen-caffeine (FIORICET) 50-325-40 MG tablet 14 tablet 0    Sig: TAKE 1 TABLET BY MOUTH EVERY 6 HOURS AS NEEDED FOR HEADACHE     Not Delegated - Analgesics:  Non-Opioid Analgesic Combinations 2 Failed - 01/29/2022  5:12 PM      Failed - This refill cannot be delegated      Passed - Cr in normal range and within 360 days    Creatinine, Ser  Date Value Ref Range Status  10/30/2021 0.76 0.44 - 1.00 mg/dL Final         Passed - eGFR is 10 or above and within 360 days    GFR calc Af Amer  Date Value Ref Range Status  04/22/2020 106 >59 mL/min/1.73 Final    Comment:    **In accordance with recommendations from the NKF-ASN Task force,**   [facility] is in the process of updating its eGFR calculation to the   2021 CKD-EPI creatinine equation that estimates kidney function   without a race variable.    GFR, Estimated  Date Value Ref Range Status  10/30/2021 >60 >60 mL/min Final    Comment:    (NOTE) Calculated using the CKD-EPI Creatinine Equation (2021)    eGFR  Date Value Ref Range Status  08/07/2021 96 >59 mL/min/1.73 Final         Passed - Patient is not pregnant      Passed - Valid encounter within last 12 months    Recent Outpatient Visits           3 months ago Chronic migraine without aura without status migrainosus, not intractable   [facility] Mecum, [name] E, PA-C   5 months ago Hyperlipidemia associated with type 2 diabetes mellitus ([place])   [place], Karen, NP   10 months ago Type 2 diabetes mellitus with hyperglycemia, without long-term current use of insulin ([place])   [facility]  Jon Billings, NP   11 months ago Acute left-sided thoracic back pain   [facility] Jon Billings, NP   1 year ago Hypertension associated with diabetes ([place])   [facility] Jon Billings, NP       Future Appointments             In 2 weeks Mecum, Dani Gobble, PA-C [facility]            Signed Prescriptions Disp Refills   carvedilol (COREG) 3.125 MG tablet 180 tablet 0    Sig: Take 1 tablet (3.125 mg total) by mouth 2 (two) times daily with a meal.     Cardiovascular: Beta Blockers 3 Failed - 01/29/2022  5:12 PM      Failed - AST in normal range and within 360 days    AST  Date Value Ref Range Status  10/30/2021 14 (L) 15 - 41 U/L Final         Failed - Last BP in normal range    BP Readings from Last 1 Encounters:  11/16/21 (!) 145/80         Passed - Cr in normal range and within 360 days    Creatinine, Ser  Date  Value Ref Range Status  10/30/2021 0.76 0.44 - 1.00 mg/dL Final         Passed - ALT in normal range and within 360 days    ALT  Date Value Ref Range Status  10/30/2021 17 0 - 44 U/L Final         Passed - Last Heart Rate in normal range    Pulse Readings from Last 1 Encounters:  11/16/21 64         Passed - Valid encounter within last 6 months    Recent Outpatient Visits           3 months ago Chronic migraine without aura without status migrainosus, not intractable   [facility] Mecum, [name] E, PA-C   5 months ago Hyperlipidemia associated with type 2 diabetes mellitus ([place])   [facility] Jon Billings, NP   10 months ago Type 2 diabetes mellitus with hyperglycemia, without long-term current use of insulin ([place])   [facility] Jon Billings, NP   11 months ago Acute left-sided thoracic back pain   [facility] Jon Billings, NP   1 year ago Hypertension associated with diabetes [facility])   [place], Karen,  NP       Future Appointments             In 2 weeks Mecum, Dani Gobble, PA-C [employer], [facility]

## 2022-01-29 NOTE — Telephone Encounter (Signed)
Requested Prescriptions  Pending Prescriptions Disp Refills   carvedilol (COREG) 3.125 MG tablet 60 tablet 0    Sig: Take 1 tablet (3.125 mg total) by mouth 2 (two) times daily with a meal.     Cardiovascular: Beta Blockers 3 Failed - 01/29/2022  5:12 PM      Failed - AST in normal range and within 360 days    AST  Date Value Ref Range Status  10/30/2021 14 (L) 15 - 41 U/L Final         Failed - Last BP in normal range    BP Readings from Last 1 Encounters:  11/16/21 (!) 145/80         Passed - Cr in normal range and within 360 days    Creatinine, Ser  Date Value Ref Range Status  10/30/2021 0.76 0.44 - 1.00 mg/dL Final         Passed - ALT in normal range and within 360 days    ALT  Date Value Ref Range Status  10/30/2021 17 0 - 44 U/L Final         Passed - Last Heart Rate in normal range    Pulse Readings from Last 1 Encounters:  11/16/21 64         Passed - Valid encounter within last 6 months    Recent Outpatient Visits           3 months ago Chronic migraine without aura without status migrainosus, not intractable   [facility] Mecum, [name] E, PA-C   5 months ago Hyperlipidemia associated with type 2 diabetes mellitus ([place])   [place], Karen, NP   10 months ago Type 2 diabetes mellitus with hyperglycemia, without long-term current use of insulin ([place])   [facility] Jon Billings, NP   11 months ago Acute left-sided thoracic back pain   [facility] Jon Billings, NP   1 year ago Hypertension associated with diabetes ([place])   [place], Karen, NP       Future Appointments             In 2 weeks Mecum, [name] E, PA-C [facility]             butalbital-acetaminophen-caffeine (FIORICET) 50-325-40 MG tablet 14 tablet 0    Sig: TAKE 1 TABLET BY MOUTH EVERY 6 HOURS AS NEEDED FOR HEADACHE     Not Delegated - Analgesics:  Non-Opioid Analgesic  Combinations 2 Failed - 01/29/2022  5:12 PM      Failed - This refill cannot be delegated      Passed - Cr in normal range and within 360 days    Creatinine, Ser  Date Value Ref Range Status  10/30/2021 0.76 0.44 - 1.00 mg/dL Final         Passed - eGFR is 10 or above and within 360 days    GFR calc Af Amer  Date Value Ref Range Status  04/22/2020 106 >59 mL/min/1.73 Final    Comment:    **In accordance with recommendations from the NKF-ASN Task force,**   [facility] is in the process of updating its eGFR calculation to the   2021 CKD-EPI creatinine equation that estimates kidney function   without a race variable.    GFR, Estimated  Date Value Ref Range Status  10/30/2021 >60 >60 mL/min Final    Comment:    (NOTE) Calculated using the CKD-EPI Creatinine Equation (2021)  eGFR  Date Value Ref Range Status  08/07/2021 96 >59 mL/min/1.73 Final         Passed - Patient is not pregnant      Passed - Valid encounter within last 12 months    Recent Outpatient Visits           3 months ago Chronic migraine without aura without status migrainosus, not intractable   [facility] Mecum, [name] E, PA-C   5 months ago Hyperlipidemia associated with type 2 diabetes mellitus ([place])   [facility] Jon Billings, NP   10 months ago Type 2 diabetes mellitus with hyperglycemia, without long-term current use of insulin ([place])   [facility] Jon Billings, NP   11 months ago Acute left-sided thoracic back pain   [facility] Jon Billings, NP   1 year ago Hypertension associated with diabetes [facility])   [place], Karen, NP       Future Appointments             In 2 weeks Mecum, Dani Gobble, PA-C [employer], [facility]

## 2022-01-29 NOTE — Telephone Encounter (Unsigned)
Copied from [place] [id number]. Topic: General - Other >> Jan 29, 2022  4:37 PM Everette C wrote: Reason for CRM: Medication Refill - Medication: carvedilol (COREG) 3.125 MG tablet [[id number]]  butalbital-acetaminophen-caffeine (FIORICET) 50-325-40 MG tablet [[id number]]  Has the patient contacted their pharmacy? Yes.   (Agent: If no, request that the patient contact the pharmacy for the refill. If patient does not wish to contact the pharmacy document the reason why and proceed with request.) (Agent: If yes, when and what did the pharmacy advise?)  Preferred Pharmacy (with phone number or street name): [place], [place] [address] Phone: [phone] Fax: [phone] Hours: Not open 24 hours   Has the patient been seen for an appointment in the last year OR does the patient have an upcoming appointment? Yes.    Agent: Please be advised that RX refills may take up to 3 business days. We ask that you follow-up with your pharmacy.

## 2022-01-30 MED ORDER — BUTALBITAL-APAP-CAFFEINE 50-325-40 MG PO TABS
ORAL_TABLET | ORAL | 0 refills | Status: DC
Start: 2022-01-30 — End: 2022-05-01

## 2022-01-30 NOTE — Telephone Encounter (Signed)
Patient last seen by Lacey Schaefer in August for migraines, has follow up 02/16/22 with [name].

## 2022-01-30 NOTE — Telephone Encounter (Signed)
Refilled 01/29/2022. Requested Prescriptions  Pending Prescriptions Disp Refills   carvedilol (COREG) 3.125 MG tablet [Pharmacy Med Name: Carvedilol 3.125 MG Oral Tablet] 60 tablet 0    Sig: TAKE 1 TABLET BY MOUTH TWICE DAILY WITH A MEAL     Cardiovascular: Beta Blockers 3 Failed - 01/29/2022  4:31 PM      Failed - AST in normal range and within 360 days    AST  Date Value Ref Range Status  10/30/2021 14 (L) 15 - 41 U/L Final         Failed - Last BP in normal range    BP Readings from Last 1 Encounters:  11/16/21 (!) 145/80         Passed - Cr in normal range and within 360 days    Creatinine, Ser  Date Value Ref Range Status  10/30/2021 0.76 0.44 - 1.00 mg/dL Final         Passed - ALT in normal range and within 360 days    ALT  Date Value Ref Range Status  10/30/2021 17 0 - 44 U/L Final         Passed - Last Heart Rate in normal range    Pulse Readings from Last 1 Encounters:  11/16/21 64         Passed - Valid encounter within last 6 months    Recent Outpatient Visits           3 months ago Chronic migraine without aura without status migrainosus, not intractable   [facility] Mecum, [name] E, PA-C   5 months ago Hyperlipidemia associated with type 2 diabetes mellitus ([place])   [place], Karen, NP   10 months ago Type 2 diabetes mellitus with hyperglycemia, without long-term current use of insulin ([place])   [facility] Jon Billings, NP   11 months ago Acute left-sided thoracic back pain   [facility] Jon Billings, NP   1 year ago Hypertension associated with diabetes [facility])   [place], Karen, NP       Future Appointments             In 2 weeks Mecum, Dani Gobble, PA-C [employer] [facility], [facility]

## 2022-02-06 ENCOUNTER — Encounter: Payer: Self-pay | Admitting: Nurse Practitioner

## 2022-02-12 ENCOUNTER — Other Ambulatory Visit: Payer: Self-pay | Admitting: Nurse Practitioner

## 2022-02-12 DIAGNOSIS — Z1231 Encounter for screening mammogram for malignant neoplasm of breast: Secondary | ICD-10-CM

## 2022-02-16 ENCOUNTER — Ambulatory Visit: Payer: 59 | Admitting: Physician Assistant

## 2022-02-16 NOTE — Progress Notes (Deleted)
There were no vitals taken for this visit.   Subjective:    Patient ID: Lacey Schaefer, female    DOB: 10/23/1960, 61 y.o.   MRN: [id number]  HPI: Lacey Schaefer is a 61 y.o. female presenting on 02/19/2022 for comprehensive medical examination. Current medical complaints include:{Blank single:19197::"none","***"}  She currently lives with: Menopausal Symptoms: {Blank single:19197::"yes","no"}  HYPERTENSION / [place] Satisfied with current treatment? yes Duration of hypertension: years BP monitoring frequency: daily BP range: 120-130/70-80 BP medication side effects: no Past BP meds:  hydralazine, amlodipine, carvedilol, and HCTZ Duration of hyperlipidemia: years Cholesterol medication side effects: no Cholesterol supplements: none Past cholesterol medications: pravastatin (pravachol) Medication compliance: excellent compliance Aspirin: yes Recent stressors: no Recurrent headaches: no Visual changes: no Palpitations: no Dyspnea: no Chest pain: no Lower extremity edema: no Dizzy/lightheaded: no   DIABETES Hypoglycemic episodes:no Polydipsia/polyuria: no Visual disturbance: no Chest pain: no Paresthesias: no Glucose Monitoring: no             Accucheck frequency: TID             Fasting glucose:             Post prandial:             Evening:             Before meals: Taking Insulin?: no             Long acting insulin:             Short acting insulin: Blood Pressure Monitoring: daily Retinal Examination: Not up to Date Foot Exam: Up to Date Diabetic Education: Not Completed Pneumovax: Not up to Date Influenza: Not up to Date Aspirin: no Patient states she was able to go back on the Janumet because she got new insurance. She has been getting it from Endocrinology in [place]. Depression Screen done today and results listed below:     10/31/2021    4:06 PM 08/07/2021    4:09 PM 03/30/2021   11:06 AM 02/21/2021   11:53 AM 12/01/2020   11:30 AM   Depression screen PHQ 2/9  Decreased Interest 0 0 1 0 0  Down, Depressed, Hopeless 0 0 1 0 1  PHQ - 2 Score 0 0 2 0 1  Altered sleeping 0 0 0 1   Tired, decreased energy 0 0 1 1   Change in appetite 0 0 0 3   Feeling bad or failure about yourself  0 0 0 0   Trouble concentrating 0 0 0 0   Moving slowly or fidgety/restless 0 0 0 0   Suicidal thoughts 0 0 0 0   PHQ-9 Score 0 0 3 5   Difficult doing work/chores Not difficult at all Not difficult at all Not difficult at all      The patient {has/does not have:19849} a history of falls. I {did/did not:19850} complete a risk assessment for falls. A plan of care for falls {was/was not:19852} documented.   Past Medical History:  Past Medical History:  Diagnosis Date   Diabetes mellitus without complication ([place])     Surgical History:  Past Surgical History:  Procedure Laterality Date   TOTAL ABDOMINAL HYSTERECTOMY      Medications:  Current Outpatient Medications on File Prior to Visit  Medication Sig   albuterol (VENTOLIN HFA) 108 (90 Base) MCG/ACT inhaler Inhale 2 puffs into the lungs every 6 (six) hours as needed for wheezing or shortness of breath.   amLODipine (NORVASC)  10 MG tablet TAKE 1 TABLET BY MOUTH EVERY DAY   aspirin 81 MG EC tablet Take by mouth daily.    butalbital-acetaminophen-caffeine (FIORICET) 50-325-40 MG tablet TAKE 1 TABLET BY MOUTH EVERY 6 HOURS AS NEEDED FOR HEADACHE   carvedilol (COREG) 3.125 MG tablet Take 1 tablet (3.125 mg total) by mouth 2 (two) times daily with a meal.   diltiazem (CARDIZEM) 60 MG tablet Take tablet TWO hours prior to your cardiac CT scan.   hydrALAZINE (APRESOLINE) 50 MG tablet Take 100 mg by mouth 2 (two) times daily.   hydrochlorothiazide (HYDRODIURIL) 25 MG tablet TAKE 1 TABLET (25 MG TOTAL) BY MOUTH DAILY. (Patient taking differently: Take 12.5 mg by mouth daily.)   ivabradine (CORLANOR) 5 MG TABS tablet Take tablets ('15mg'$ ) TWO hours prior to your cardiac CT scan.   PATADAY 0.7  % SOLN Apply to eye.   potassium chloride SA (KLOR-CON M) 20 MEQ tablet Take 1 tablet (20 mEq total) by mouth daily.   pravastatin (PRAVACHOL) 40 MG tablet TAKE 1 TABLET BY MOUTH EVERY DAY   sitaGLIPtin-metformin (JANUMET) 50-1000 MG tablet Take 1 tablet by mouth 2 (two) times daily.   No current facility-administered medications on file prior to visit.    Allergies:  Allergies  Allergen Reactions   Sulfasalazine Hives   Elemental Sulfur Other (See Comments)   Metoprolol Other (See Comments) and Cough   Sulfa Antibiotics Hives   Lisinopril Cough    Social History:  Social History   Socioeconomic History   Marital status: Married    Spouse name: Not on file   Number of children: Not on file   Years of education: Not on file   Highest education level: Not on file  Occupational History   Not on file  Tobacco Use   Smoking status: Former   Smokeless tobacco: Never  Vaping Use   Vaping Use: Never used  Substance and Sexual Activity   Alcohol use: No   Drug use: No   Sexual activity: Yes  Other Topics Concern   Not on file  Social History Narrative   Not on file   Social Determinants of Health   Financial Resource Strain: Not on file  Food Insecurity: Not on file  Transportation Needs: Not on file  Physical Activity: Not on file  Stress: Not on file  Social Connections: Not on file  Intimate Partner Violence: Not on file   Social History   Tobacco Use  Smoking Status Former  Smokeless Tobacco Never   Social History   Substance and Sexual Activity  Alcohol Use No    Family History:  Family History  Problem Relation Age of Onset   Diabetes Mother    Prostate cancer Father    Heart disease Sister    Prostate cancer Brother    Prostate cancer Maternal Grandfather    Prostate cancer Brother     Past medical history, surgical history, medications, allergies, family history and social history reviewed with patient today and changes made to appropriate  areas of the chart.   ROS All other ROS negative except what is listed above and in the HPI.      Objective:    There were no vitals taken for this visit.  Wt Readings from Last 3 Encounters:  10/31/21 184 lb 3.2 oz (83.6 kg)  10/30/21 179 lb (81.2 kg)  08/07/21 173 lb 3.2 oz (78.6 kg)    Physical Exam  Results for orders placed or performed during the hospital  encounter of 10/30/21  Protime-INR  Result Value Ref Range   Prothrombin Time 13.0 11.4 - 15.2 seconds   INR 1.0 0.8 - 1.2  APTT  Result Value Ref Range   aPTT 34 24 - 36 seconds  CBC  Result Value Ref Range   WBC 6.3 4.0 - 10.5 K/uL   RBC 4.58 3.87 - 5.11 MIL/uL   Hemoglobin 12.0 12.0 - 15.0 g/dL   HCT 38.9 36.0 - 46.0 %   MCV 84.9 80.0 - 100.0 fL   MCH 26.2 26.0 - 34.0 pg   MCHC 30.8 30.0 - 36.0 g/dL   RDW 14.4 11.5 - 15.5 %   Platelets 345 150 - 400 K/uL   nRBC 0.0 0.0 - 0.2 %  Differential  Result Value Ref Range   Neutrophils Relative % 57 %   Neutro Abs 3.6 1.7 - 7.7 K/uL   Lymphocytes Relative 34 %   Lymphs Abs 2.2 0.7 - 4.0 K/uL   Monocytes Relative 6 %   Monocytes Absolute 0.4 0.1 - 1.0 K/uL   Eosinophils Relative 2 %   Eosinophils Absolute 0.1 0.0 - 0.5 K/uL   Basophils Relative 1 %   Basophils Absolute 0.1 0.0 - 0.1 K/uL   Immature Granulocytes 0 %   Abs Immature Granulocytes 0.02 0.00 - 0.07 K/uL  Comprehensive metabolic panel  Result Value Ref Range   Sodium 139 135 - 145 mmol/L   Potassium 3.0 (L) 3.5 - 5.1 mmol/L   Chloride 104 98 - 111 mmol/L   CO2 26 22 - 32 mmol/L   Glucose, Bld 152 (H) 70 - 99 mg/dL   BUN 17 8 - 23 mg/dL   Creatinine, Ser 0.76 0.44 - 1.00 mg/dL   Calcium 9.2 8.9 - 10.3 mg/dL   Total Protein 7.3 6.5 - 8.1 g/dL   Albumin 4.0 3.5 - 5.0 g/dL   AST 14 (L) 15 - 41 U/L   ALT 17 0 - 44 U/L   Alkaline Phosphatase 61 38 - 126 U/L   Total Bilirubin 0.2 (L) 0.3 - 1.2 mg/dL   GFR, Estimated >60 >60 mL/min   Anion gap 9 5 - 15  Ethanol  Result Value Ref Range    Alcohol, Ethyl (B) <10 <10 mg/dL      Assessment & Plan:   Problem List Items Addressed This Visit       Cardiovascular and Mediastinum   Hypertension associated with diabetes ([place]) - Primary     Endocrine   Type 2 diabetes mellitus with hyperglycemia ([facility])   Hyperlipidemia associated with type 2 diabetes mellitus ([place])     Follow up plan: No follow-ups on file.   LABORATORY TESTING:  - Pap smear: {Blank ZDGLOV:56433::"IRJ done","not applicable","up to date","done elsewhere"}  IMMUNIZATIONS:   - Tdap: Tetanus vaccination status reviewed: {tetanus status:315746}. - Influenza: {Blank single:19197::"Up to date","Administered today","Postponed to flu season","Refused","Given elsewhere"} - Pneumovax: {Blank single:19197::"Up to date","Administered today","Not applicable","Refused","Given elsewhere"} - Prevnar: {Blank single:19197::"Up to date","Administered today","Not applicable","Refused","Given elsewhere"} - COVID: {Blank single:19197::"Up to date","Administered today","Not applicable","Refused","Given elsewhere"} - HPV: {Blank single:19197::"Up to date","Administered today","Not applicable","Refused","Given elsewhere"} - Shingrix vaccine: {Blank single:19197::"Up to date","Administered today","Not applicable","Refused","Given elsewhere"}  SCREENING: -Mammogram: {Blank single:19197::"Up to date","Ordered today","Not applicable","Refused","Done elsewhere"}  - Colonoscopy: {Blank single:19197::"Up to date","Ordered today","Not applicable","Refused","Done elsewhere"}  - Bone Density: {Blank single:19197::"Up to date","Ordered today","Not applicable","Refused","Done elsewhere"}  -Hearing Test: {Blank single:19197::"Up to date","Ordered today","Not applicable","Refused","Done elsewhere"}  -Spirometry: {Blank single:19197::"Up to date","Ordered today","Not applicable","Refused","Done elsewhere"}   PATIENT COUNSELING:   Advised to take  1 mg of folate supplement per day if capable of  pregnancy.   Sexuality: Discussed sexually transmitted diseases, partner selection, use of condoms, avoidance of unintended pregnancy  and contraceptive alternatives.   Advised to avoid cigarette smoking.  I discussed with the patient that most people either abstain from alcohol or drink within safe limits (<=14/week and <=4 drinks/occasion for males, <=7/weeks and <= 3 drinks/occasion for females) and that the risk for alcohol disorders and other health effects rises proportionally with the number of drinks per week and how often a drinker exceeds daily limits.  Discussed cessation/primary prevention of drug use and availability of treatment for abuse.   Diet: Encouraged to adjust caloric intake to maintain  or achieve ideal body weight, to reduce intake of dietary saturated fat and total fat, to limit sodium intake by avoiding high sodium foods and not adding table salt, and to maintain adequate dietary potassium and calcium preferably from fresh fruits, vegetables, and low-fat dairy products.    stressed the importance of regular exercise  Injury prevention: Discussed safety belts, safety helmets, smoke detector, smoking near bedding or upholstery.   Dental health: Discussed importance of regular tooth brushing, flossing, and dental visits.    NEXT PREVENTATIVE PHYSICAL DUE IN 1 YEAR. No follow-ups on file.

## 2022-02-19 ENCOUNTER — Ambulatory Visit: Payer: 59 | Admitting: Nurse Practitioner

## 2022-02-19 DIAGNOSIS — E1165 Type 2 diabetes mellitus with hyperglycemia: Secondary | ICD-10-CM

## 2022-02-19 DIAGNOSIS — E1159 Type 2 diabetes mellitus with other circulatory complications: Secondary | ICD-10-CM

## 2022-02-19 DIAGNOSIS — E1169 Type 2 diabetes mellitus with other specified complication: Secondary | ICD-10-CM

## 2022-03-05 ENCOUNTER — Ambulatory Visit (INDEPENDENT_AMBULATORY_CARE_PROVIDER_SITE_OTHER): Payer: 59 | Admitting: Nurse Practitioner

## 2022-03-05 ENCOUNTER — Encounter: Payer: Self-pay | Admitting: Nurse Practitioner

## 2022-03-05 VITALS — BP 130/70 | HR 80 | Temp 98.6°F | Wt 181.7 lb

## 2022-03-05 DIAGNOSIS — E1169 Type 2 diabetes mellitus with other specified complication: Secondary | ICD-10-CM | POA: Diagnosis not present

## 2022-03-05 DIAGNOSIS — E1159 Type 2 diabetes mellitus with other circulatory complications: Secondary | ICD-10-CM

## 2022-03-05 DIAGNOSIS — H6123 Impacted cerumen, bilateral: Secondary | ICD-10-CM | POA: Diagnosis not present

## 2022-03-05 DIAGNOSIS — Z1211 Encounter for screening for malignant neoplasm of colon: Secondary | ICD-10-CM | POA: Diagnosis not present

## 2022-03-05 DIAGNOSIS — E1165 Type 2 diabetes mellitus with hyperglycemia: Secondary | ICD-10-CM | POA: Diagnosis not present

## 2022-03-05 DIAGNOSIS — Z0000 Encounter for general adult medical examination without abnormal findings: Secondary | ICD-10-CM | POA: Diagnosis not present

## 2022-03-05 DIAGNOSIS — G8929 Other chronic pain: Secondary | ICD-10-CM

## 2022-03-05 DIAGNOSIS — E785 Hyperlipidemia, unspecified: Secondary | ICD-10-CM

## 2022-03-05 DIAGNOSIS — I152 Hypertension secondary to endocrine disorders: Secondary | ICD-10-CM

## 2022-03-05 DIAGNOSIS — M25511 Pain in right shoulder: Secondary | ICD-10-CM

## 2022-03-05 LAB — URINALYSIS, ROUTINE W REFLEX MICROSCOPIC
Bilirubin, UA: NEGATIVE
Ketones, UA: NEGATIVE
Leukocytes,UA: NEGATIVE
Nitrite, UA: NEGATIVE
Protein,UA: NEGATIVE
RBC, UA: NEGATIVE
Specific Gravity, UA: 1.015 (ref 1.005–1.030)
Urobilinogen, Ur: 0.2 mg/dL (ref 0.2–1.0)
pH, UA: 5.5 (ref 5.0–7.5)

## 2022-03-05 LAB — MICROALBUMIN, URINE WAIVED
Creatinine, Urine Waived: 50 mg/dL (ref 10–300)
Microalb, Ur Waived: 80 mg/L — ABNORMAL HIGH (ref 0–19)
Microalb/Creat Ratio: >300 mg/g — ABNORMAL HIGH (ref <30)

## 2022-03-05 NOTE — Assessment & Plan Note (Signed)
Chronic.  Controlled.  Continue with current medication regimen of amlodipine, HCTZ, hydralazine and carvedilol.  Labs ordered today.  Return to clinic in 6 months for reevaluation.  Call sooner if concerns arise.

## 2022-03-05 NOTE — Assessment & Plan Note (Signed)
Chronic.  Controlled.  Continue with current medication regimen of Janumet Labs ordered today.  Return to clinic in 6 months for reevaluation.  Call sooner if concerns arise.

## 2022-03-05 NOTE — Progress Notes (Signed)
BP 130/70   Pulse 80   Temp 98.6 F (37 C) (Oral)   Wt 181 lb 11.2 oz (82.4 kg)   SpO2 97%   BMI 31.33 kg/m    Subjective:    Patient ID: Lacey Schaefer, female    DOB: [birth year], 61 y.o.   MRN: [id number]  HPI: Lacey Schaefer is a 61 y.o. female presenting on 03/05/2022 for comprehensive medical examination. Current medical complaints include: ear feels clogged  She currently lives with: Menopausal Symptoms: no  HYPERTENSION / HYPERLIPIDEMIA Satisfied with current treatment? yes Duration of hypertension: years BP monitoring frequency: daily BP range: 120-130/70-80 BP medication side effects: no Past BP meds:  hydralazine, amlodipine, carvedilol, and HCTZ Duration of hyperlipidemia: years Cholesterol medication side effects: no Cholesterol supplements: none Past cholesterol medications: pravastatin (pravachol) Medication compliance: excellent compliance Aspirin: yes Recent stressors: no Recurrent headaches: no Visual changes: no Palpitations: no Dyspnea: no Chest pain: no Lower extremity edema: no Dizzy/lightheaded: no   DIABETES Hypoglycemic episodes:no Polydipsia/polyuria: no Visual disturbance: no Chest pain: no Paresthesias: no Glucose Monitoring: no             Accucheck frequency: TID             Fasting glucose:             Post prandial:             Evening:             Before meals: Taking Insulin?: no             Long acting insulin:             Short acting insulin: Blood Pressure Monitoring: daily Retinal Examination: Not up to Date Foot Exam: Up to Date Diabetic Education: Not Completed Pneumovax: Not up to Date Influenza: Not up to Date Aspirin: no Patient states she was able to go back on the Janumet because she got new insurance. She has been getting it from Endocrinology in [place].  SHOULDER PAIN Duration: chronic Involved shoulder: right Mechanism of injury: unknown Location: diffuse Onset:gradual Severity: mild   Quality:  aching Frequency: intermittent Radiation: no Aggravating factors: movement  Alleviating factors: nothing  Status: worse Treatments attempted: rest  Relief with NSAIDs?:  no Weakness: yes Numbness: no Decreased grip strength: no Redness: no Swelling: no Bruising: no Fevers: no     Depression Screen done today and results listed below:     03/05/2022   10:28 AM 10/31/2021    4:06 PM 08/07/2021    4:09 PM 03/30/2021   11:06 AM 02/21/2021   11:53 AM  Depression screen PHQ 2/9  Decreased Interest 0 0 0 1 0  Down, Depressed, Hopeless 0 0 0 1 0  PHQ - 2 Score 0 0 0 2 0  Altered sleeping 0 0 0 0 1  Tired, decreased energy 0 0 0 1 1  Change in appetite 0 0 0 0 3  Feeling bad or failure about yourself  0 0 0 0 0  Trouble concentrating 0 0 0 0 0  Moving slowly or fidgety/restless 0 0 0 0 0  Suicidal thoughts 0 0 0 0 0  PHQ-9 Score 0 0 0 3 5  Difficult doing work/chores Not difficult at all Not difficult at all Not difficult at all Not difficult at all     The patient does not have a history of falls. I did complete a risk assessment for falls. A plan of care for  falls was documented.   Past Medical History:  Past Medical History:  Diagnosis Date   Diabetes mellitus without complication ([place])     Surgical History:  Past Surgical History:  Procedure Laterality Date   TOTAL ABDOMINAL HYSTERECTOMY      Medications:  Current Outpatient Medications on File Prior to Visit  Medication Sig   albuterol (VENTOLIN HFA) 108 (90 Base) MCG/ACT inhaler Inhale 2 puffs into the lungs every 6 (six) hours as needed for wheezing or shortness of breath.   amLODipine (NORVASC) 10 MG tablet TAKE 1 TABLET BY MOUTH EVERY DAY   aspirin 81 MG EC tablet Take by mouth daily.    butalbital-acetaminophen-caffeine (FIORICET) 50-325-40 MG tablet TAKE 1 TABLET BY MOUTH EVERY 6 HOURS AS NEEDED FOR HEADACHE   carvedilol (COREG) 3.125 MG tablet Take 1 tablet (3.125 mg total) by mouth 2 (two)  times daily with a meal.   hydrALAZINE (APRESOLINE) 50 MG tablet Take 100 mg by mouth 2 (two) times daily.   hydrochlorothiazide (HYDRODIURIL) 25 MG tablet TAKE 1 TABLET (25 MG TOTAL) BY MOUTH DAILY. (Patient taking differently: Take 12.5 mg by mouth daily.)   PATADAY 0.7 % SOLN Apply to eye.   potassium chloride SA (KLOR-CON M) 20 MEQ tablet Take 1 tablet (20 mEq total) by mouth daily.   pravastatin (PRAVACHOL) 40 MG tablet TAKE 1 TABLET BY MOUTH EVERY DAY   sitaGLIPtin-metformin (JANUMET) 50-1000 MG tablet Take 1 tablet by mouth 2 (two) times daily.   No current facility-administered medications on file prior to visit.    Allergies:  Allergies  Allergen Reactions   Sulfasalazine Hives   Elemental Sulfur Other (See Comments)   Metoprolol Other (See Comments) and Cough   Sulfa Antibiotics Hives   Lisinopril Cough    Social History:  Social History   Socioeconomic History   Marital status: Married    Spouse name: Not on file   Number of children: Not on file   Years of education: Not on file   Highest education level: Not on file  Occupational History   Not on file  Tobacco Use   Smoking status: Former   Smokeless tobacco: Never  Vaping Use   Vaping Use: Never used  Substance and Sexual Activity   Alcohol use: No   Drug use: No   Sexual activity: Yes  Other Topics Concern   Not on file  Social History Narrative   Not on file   Social Determinants of Health   Financial Resource Strain: Not on file  Food Insecurity: Not on file  Transportation Needs: Not on file  Physical Activity: Not on file  Stress: Not on file  Social Connections: Not on file  Intimate Partner Violence: Not on file   Social History   Tobacco Use  Smoking Status Former  Smokeless Tobacco Never   Social History   Substance and Sexual Activity  Alcohol Use No    Family History:  Family History  Problem Relation Age of Onset   Diabetes Mother    Prostate cancer Father    Heart  disease Sister    Prostate cancer Brother    Prostate cancer Maternal Grandfather    Prostate cancer Brother     Past medical history, surgical history, medications, allergies, family history and social history reviewed with patient today and changes made to appropriate areas of the chart.   Review of Systems  HENT:  Positive for ear pain.        Denies vision changes.  Eyes:  Negative for blurred vision and double vision.  Respiratory:  Negative for shortness of breath.   Cardiovascular:  Negative for chest pain, palpitations and leg swelling.  Neurological:  Negative for dizziness, tingling and headaches.  Endo/Heme/Allergies:  Negative for polydipsia.       Denies Polyuria   All other ROS negative except what is listed above and in the HPI.      Objective:    BP 130/70   Pulse 80   Temp 98.6 F (37 C) (Oral)   Wt 181 lb 11.2 oz (82.4 kg)   SpO2 97%   BMI 31.33 kg/m   Wt Readings from Last 3 Encounters:  03/05/22 181 lb 11.2 oz (82.4 kg)  10/31/21 184 lb 3.2 oz (83.6 kg)  10/30/21 179 lb (81.2 kg)    Physical Exam Vitals and nursing note reviewed.  Constitutional:      General: She is not in acute distress.    Appearance: Normal appearance. She is not ill-appearing, toxic-appearing or diaphoretic.  HENT:     Head: Normocephalic.     Right Ear: Tympanic membrane and external ear normal.     Left Ear: Tympanic membrane and external ear normal.     Nose: Nose normal.     Mouth/Throat:     Mouth: Mucous membranes are moist.     Pharynx: Oropharynx is clear.  Eyes:     General:        Right eye: No discharge.        Left eye: No discharge.     Extraocular Movements: Extraocular movements intact.     Conjunctiva/sclera: Conjunctivae normal.     Pupils: Pupils are equal, round, and reactive to light.  Cardiovascular:     Rate and Rhythm: Normal rate and regular rhythm.     Heart sounds: No murmur heard. Pulmonary:     Effort: Pulmonary effort is normal. No  respiratory distress.     Breath sounds: Normal breath sounds. No wheezing or rales.  Musculoskeletal:     Cervical back: Normal range of motion and neck supple.  Skin:    General: Skin is warm and dry.     Capillary Refill: Capillary refill takes less than 2 seconds.  Neurological:     General: No focal deficit present.     Mental Status: She is alert and oriented to person, place, and time. Mental status is at baseline.  Psychiatric:        Mood and Affect: Mood normal.        Behavior: Behavior normal.        Thought Content: Thought content normal.        Judgment: Judgment normal.     Results for orders placed or performed during the hospital encounter of 10/30/21  Protime-INR  Result Value Ref Range   Prothrombin Time 13.0 11.4 - 15.2 seconds   INR 1.0 0.8 - 1.2  APTT  Result Value Ref Range   aPTT 34 24 - 36 seconds  CBC  Result Value Ref Range   WBC 6.3 4.0 - 10.5 K/uL   RBC 4.58 3.87 - 5.11 MIL/uL   Hemoglobin 12.0 12.0 - 15.0 g/dL   HCT 38.9 36.0 - 46.0 %   MCV 84.9 80.0 - 100.0 fL   MCH 26.2 26.0 - 34.0 pg   MCHC 30.8 30.0 - 36.0 g/dL   RDW 14.4 11.5 - 15.5 %   Platelets 345 150 - 400 K/uL   nRBC 0.0 0.0 -  0.2 %  Differential  Result Value Ref Range   Neutrophils Relative % 57 %   Neutro Abs 3.6 1.7 - 7.7 K/uL   Lymphocytes Relative 34 %   Lymphs Abs 2.2 0.7 - 4.0 K/uL   Monocytes Relative 6 %   Monocytes Absolute 0.4 0.1 - 1.0 K/uL   Eosinophils Relative 2 %   Eosinophils Absolute 0.1 0.0 - 0.5 K/uL   Basophils Relative 1 %   Basophils Absolute 0.1 0.0 - 0.1 K/uL   Immature Granulocytes 0 %   Abs Immature Granulocytes 0.02 0.00 - 0.07 K/uL  Comprehensive metabolic panel  Result Value Ref Range   Sodium 139 135 - 145 mmol/L   Potassium 3.0 (L) 3.5 - 5.1 mmol/L   Chloride 104 98 - 111 mmol/L   CO2 26 22 - 32 mmol/L   Glucose, Bld 152 (H) 70 - 99 mg/dL   BUN 17 8 - 23 mg/dL   Creatinine, Ser 0.76 0.44 - 1.00 mg/dL   Calcium 9.2 8.9 - 10.3 mg/dL    Total Protein 7.3 6.5 - 8.1 g/dL   Albumin 4.0 3.5 - 5.0 g/dL   AST 14 (L) 15 - 41 U/L   ALT 17 0 - 44 U/L   Alkaline Phosphatase 61 38 - 126 U/L   Total Bilirubin 0.2 (L) 0.3 - 1.2 mg/dL   GFR, Estimated >60 >60 mL/min   Anion gap 9 5 - 15  Ethanol  Result Value Ref Range   Alcohol, Ethyl (B) <10 <10 mg/dL      Assessment & Plan:   Problem List Items Addressed This Visit       Cardiovascular and Mediastinum   Hypertension associated with diabetes (HCC)    Chronic.  Controlled.  Continue with current medication regimen of amlodipine, HCTZ, hydralazine and carvedilol.  Labs ordered today.  Return to clinic in 6 months for reevaluation.  Call sooner if concerns arise.          Endocrine   Type 2 diabetes mellitus with hyperglycemia (HCC)    Chronic.  Controlled.  Continue with current medication regimen of Janumet Labs ordered today.  Return to clinic in 6 months for reevaluation.  Call sooner if concerns arise.        Relevant Orders   HgB A1c   Microalbumin, Urine Waived   Hyperlipidemia associated with type 2 diabetes mellitus (HCC)    Chronic.  Controlled.  Continue with current medication regimen of Pravastatin daily.  Labs ordered today.  Return to clinic in 6 months for reevaluation.  Call sooner if concerns arise.        Relevant Orders   Lipid panel   Other Visit Diagnoses     Annual physical exam    -  Primary   Health maintenance reviewed during visit today.  Labs ordered today.  Vaccines up to date. Colonoscopy and mammogram ordered today.   Relevant Orders   CBC with Differential/Platelet   Comprehensive metabolic panel   Lipid panel   TSH   Urinalysis, Routine w reflex microscopic   HgB A1c   Bilateral impacted cerumen       Not successful at removing ear wax via lavage.  Will send to ENT for further evaluation and management.   Relevant Orders   Ear Lavage   Ambulatory referral to ENT   Chronic right shoulder pain       Relevant Orders    Ambulatory referral to Physical Therapy   Screening for colon cancer  Relevant Orders   Ambulatory referral to Gastroenterology        Follow up plan: Return in about 6 months (around 09/04/2022) for HTN, HLD, DM2 FU.   LABORATORY TESTING:  - Pap smear: not applicable  IMMUNIZATIONS:   - Tdap: Tetanus vaccination status reviewed: last tetanus booster within 10 years. - Influenza: Up to date - Pneumovax: Up to date - Prevnar: Up to date - COVID: Not applicable - HPV: Not applicable - Shingrix vaccine: Refused  SCREENING: -Mammogram: Ordered today  - Colonoscopy: Ordered today  - Bone Density: Not applicable  -Hearing Test: Not applicable  -Spirometry: Not applicable   PATIENT COUNSELING:   Advised to take 1 mg of folate supplement per day if capable of pregnancy.   Sexuality: Discussed sexually transmitted diseases, partner selection, use of condoms, avoidance of unintended pregnancy  and contraceptive alternatives.   Advised to avoid cigarette smoking.  I discussed with the patient that most people either abstain from alcohol or drink within safe limits (<=14/week and <=4 drinks/occasion for males, <=7/weeks and <= 3 drinks/occasion for females) and that the risk for alcohol disorders and other health effects rises proportionally with the number of drinks per week and how often a drinker exceeds daily limits.  Discussed cessation/primary prevention of drug use and availability of treatment for abuse.   Diet: Encouraged to adjust caloric intake to maintain  or achieve ideal body weight, to reduce intake of dietary saturated fat and total fat, to limit sodium intake by avoiding high sodium foods and not adding table salt, and to maintain adequate dietary potassium and calcium preferably from fresh fruits, vegetables, and low-fat dairy products.    stressed the importance of regular exercise  Injury prevention: Discussed safety belts, safety helmets, smoke detector,  smoking near bedding or upholstery.   Dental health: Discussed importance of regular tooth brushing, flossing, and dental visits.    NEXT PREVENTATIVE PHYSICAL DUE IN 1 YEAR. Return in about 6 months (around 09/04/2022) for HTN, HLD, DM2 FU.

## 2022-03-05 NOTE — Assessment & Plan Note (Signed)
Chronic.  Controlled.  Continue with current medication regimen of Pravastatin daily.  Labs ordered today.  Return to clinic in 6 months for reevaluation.  Call sooner if concerns arise.

## 2022-03-06 ENCOUNTER — Telehealth: Payer: Self-pay | Admitting: Nurse Practitioner

## 2022-03-06 LAB — COMPREHENSIVE METABOLIC PANEL
ALT: 18 IU/L (ref 0–32)
AST: 12 IU/L (ref 0–40)
Albumin/Globulin Ratio: 1.8 (ref 1.2–2.2)
Albumin: 4.3 g/dL (ref 3.9–4.9)
Alkaline Phosphatase: 77 IU/L (ref 44–121)
BUN/Creatinine Ratio: 13 (ref 12–28)
BUN: 11 mg/dL (ref 8–27)
Bilirubin Total: 0.2 mg/dL (ref 0.0–1.2)
CO2: 25 mmol/L (ref 20–29)
Calcium: 9.5 mg/dL (ref 8.7–10.3)
Chloride: 101 mmol/L (ref 96–106)
Creatinine, Ser: 0.82 mg/dL (ref 0.57–1.00)
Globulin, Total: 2.4 g/dL (ref 1.5–4.5)
Glucose: 193 mg/dL — ABNORMAL HIGH (ref 70–99)
Potassium: 3.7 mmol/L (ref 3.5–5.2)
Sodium: 141 mmol/L (ref 134–144)
Total Protein: 6.7 g/dL (ref 6.0–8.5)
eGFR: 81 mL/min/{1.73_m2} (ref >59)

## 2022-03-06 LAB — LIPID PANEL
Chol/HDL Ratio: 3.5 ratio (ref 0.0–4.4)
Cholesterol, Total: 246 mg/dL — ABNORMAL HIGH (ref 100–199)
HDL: 70 mg/dL (ref >39)
LDL Chol Calc (NIH): 155 mg/dL — ABNORMAL HIGH (ref 0–99)
Triglycerides: 118 mg/dL (ref 0–149)
VLDL Cholesterol Cal: 21 mg/dL (ref 5–40)

## 2022-03-06 LAB — CBC WITH DIFFERENTIAL/PLATELET
Basophils Absolute: 0.1 10*3/uL (ref 0.0–0.2)
Basos: 1 %
EOS (ABSOLUTE): 0.1 10*3/uL (ref 0.0–0.4)
Eos: 2 %
Hematocrit: 38 % (ref 34.0–46.6)
Hemoglobin: 12.2 g/dL (ref 11.1–15.9)
Immature Grans (Abs): 0.1 10*3/uL (ref 0.0–0.1)
Immature Granulocytes: 1 %
Lymphocytes Absolute: 1.7 10*3/uL (ref 0.7–3.1)
Lymphs: 30 %
MCH: 27.3 pg (ref 26.6–33.0)
MCHC: 32.1 g/dL (ref 31.5–35.7)
MCV: 85 fL (ref 79–97)
Monocytes Absolute: 0.4 10*3/uL (ref 0.1–0.9)
Monocytes: 6 %
Neutrophils Absolute: 3.6 10*3/uL (ref 1.4–7.0)
Neutrophils: 60 %
Platelets: 306 10*3/uL (ref 150–450)
RBC: 4.47 x10E6/uL (ref 3.77–5.28)
RDW: 14 % (ref 11.7–15.4)
WBC: 5.9 10*3/uL (ref 3.4–10.8)

## 2022-03-06 LAB — HEMOGLOBIN A1C
Est. average glucose Bld gHb Est-mCnc: 217 mg/dL
Hgb A1c MFr Bld: 9.2 % — ABNORMAL HIGH (ref 4.8–5.6)

## 2022-03-06 LAB — TSH: TSH: 0.656 u[IU]/mL (ref 0.450–4.500)

## 2022-03-06 NOTE — Progress Notes (Signed)
Please let patient know that her lab work shows that her A1c increases from 8.9 to 9.2. At this point, we need to add more medication.  I recommend adding a once weekly injection called Ozempic.  If she agrees to this, she should come back in for an appointment so I can discuss with her how to use it.  If she does not want to do an injection, I recommend starting glipizide '2mg'$  daily.

## 2022-03-06 NOTE — Telephone Encounter (Signed)
See result note.  

## 2022-03-06 NOTE — Progress Notes (Signed)
If patient agrees to the Ozempic once we get her A1c better controlled we can decrease and possible discontinue to janumet.

## 2022-03-06 NOTE — Telephone Encounter (Signed)
Copied from [place] [id number]. Topic: General - Other >> Mar 06, 2022  4:04 PM [place] B wrote: Reason for CRM:  Patient called in wants to know if there is anything she needs to fu on from her lab results.

## 2022-03-07 NOTE — Progress Notes (Signed)
Please let patient know that I apologize but she can't take the glipizide due to her Sulfa drug allergy.  We can discuss her options further at your upcoming appointment.

## 2022-03-15 ENCOUNTER — Other Ambulatory Visit: Payer: Self-pay

## 2022-03-15 DIAGNOSIS — Z1211 Encounter for screening for malignant neoplasm of colon: Secondary | ICD-10-CM

## 2022-03-15 DIAGNOSIS — E669 Obesity, unspecified: Secondary | ICD-10-CM | POA: Insufficient documentation

## 2022-03-15 DIAGNOSIS — E876 Hypokalemia: Secondary | ICD-10-CM | POA: Insufficient documentation

## 2022-03-15 MED ORDER — NA SULFATE-K SULFATE-MG SULF 17.5-3.13-1.6 GM/177ML PO SOLN: 1.0000 | Freq: Once | ORAL | 0 refills | Status: AC

## 2022-03-15 NOTE — Progress Notes (Signed)
Gastroenterology Pre-Procedure Review  Request Date: 03/29/22 Requesting Physician: Dr. Vicente Males  PATIENT REVIEW QUESTIONS: The patient responded to the following health history questions as indicated:    1. Are you having any GI issues? yes (From time to time she experiences diarrhea associated with Janumet) 2. Do you have a personal history of Polyps? no 3. Do you have a family history of Colon Cancer or Polyps? 1st cousin had colon cancer on mothers side 4. Diabetes Mellitus? yes (takes janumet advised and noted to stop on 03/26/22) 5. Joint replacements in the past 12 months?no 6. Major health problems in the past 3 months?no 7. Any artificial heart valves, MVP, or defibrillator?no cardiac devices however patient does see Dr. Neita Goodnight request sent to Dr. Clayborn Bigness for CAD    MEDICATIONS & ALLERGIES:    Patient reports the following regarding taking any anticoagulation/antiplatelet therapy:   Plavix, Coumadin, Eliquis, Xarelto, Lovenox, Pradaxa, Brilinta, or Effient? no Aspirin? yes ('81mg'$  daily)  Patient confirms/reports the following medications:  Current Outpatient Medications  Medication Sig Dispense Refill   albuterol (VENTOLIN HFA) 108 (90 Base) MCG/ACT inhaler Inhale 2 puffs into the lungs every 6 (six) hours as needed for wheezing or shortness of breath. 18 g 1   amLODipine (NORVASC) 10 MG tablet TAKE 1 TABLET BY MOUTH EVERY DAY 90 tablet 0   aspirin 81 MG EC tablet Take by mouth daily.      butalbital-acetaminophen-caffeine (FIORICET) 50-325-40 MG tablet TAKE 1 TABLET BY MOUTH EVERY 6 HOURS AS NEEDED FOR HEADACHE 14 tablet 0   carvedilol (COREG) 3.125 MG tablet Take 1 tablet (3.125 mg total) by mouth 2 (two) times daily with a meal. 180 tablet 0   hydrALAZINE (APRESOLINE) 50 MG tablet Take 100 mg by mouth 2 (two) times daily.     hydrochlorothiazide (HYDRODIURIL) 25 MG tablet TAKE 1 TABLET (25 MG TOTAL) BY MOUTH DAILY. (Patient taking differently: Take 12.5 mg by mouth  daily.) 90 tablet 0   PATADAY 0.7 % SOLN Apply to eye.     potassium chloride SA (KLOR-CON M) 20 MEQ tablet Take 1 tablet (20 mEq total) by mouth daily. 30 tablet 0   pravastatin (PRAVACHOL) 40 MG tablet TAKE 1 TABLET BY MOUTH EVERY DAY 90 tablet 1   sitaGLIPtin-metformin (JANUMET) 50-1000 MG tablet Take 1 tablet by mouth 2 (two) times daily. 60 tablet 0   No current facility-administered medications for this visit.    Patient confirms/reports the following allergies:  Allergies  Allergen Reactions   Sulfasalazine Hives   Elemental Sulfur Other (See Comments)   Metoprolol Other (See Comments) and Cough   Sulfa Antibiotics Hives   Lisinopril Cough    No orders of the defined types were placed in this encounter.   AUTHORIZATION INFORMATION Primary Insurance: 1D#: Group #:  Secondary Insurance: 1D#: Group #:  SCHEDULE INFORMATION: Date:  Time: Location:

## 2022-03-22 ENCOUNTER — Other Ambulatory Visit: Payer: Self-pay | Admitting: Nurse Practitioner

## 2022-03-22 DIAGNOSIS — J302 Other seasonal allergic rhinitis: Secondary | ICD-10-CM | POA: Diagnosis not present

## 2022-03-22 DIAGNOSIS — T162XXA Foreign body in left ear, initial encounter: Secondary | ICD-10-CM | POA: Diagnosis not present

## 2022-03-22 DIAGNOSIS — H6123 Impacted cerumen, bilateral: Secondary | ICD-10-CM | POA: Diagnosis not present

## 2022-03-22 MED ORDER — POTASSIUM CHLORIDE CRYS ER 20 MEQ PO TBCR: 20.0000 meq | EXTENDED_RELEASE_TABLET | Freq: Every day | ORAL | 0 refills | Status: DC

## 2022-03-22 NOTE — Telephone Encounter (Signed)
Requested Prescriptions  Pending Prescriptions Disp Refills   potassium chloride SA (KLOR-CON M) 20 MEQ tablet 30 tablet 0    Sig: Take 1 tablet (20 mEq total) by mouth daily.     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, not intractable   [facility] Mecum, [name] E, PA-C   7 months ago Hyperlipidemia associated with type 2 diabetes mellitus ([place])   [facility] Jon Billings, NP   11 months ago Type 2 diabetes mellitus with hyperglycemia, without long-term current use of insulin ([place])   [facility] Jon Billings, NP   1 year ago Acute left-sided thoracic back pain   [facility] Jon Billings, NP       Future Appointments             In 5 days Jon Billings, NP [facility], [place]   In 1 week Brendolyn Patty, MD [place]   In 5 months Jon Billings, NP [facility], [place]

## 2022-03-22 NOTE — Telephone Encounter (Signed)
Medication Refill - Medication: potassium chloride SA (KLOR-CON M) 20 MEQ tablet   Has the patient contacted their pharmacy? Yes.   Pt told to contact provider  Preferred Pharmacy (with phone number or street name):  [facility], [place] Phone: [phone]  Fax: [phone]     Has the patient been seen for an appointment in the last year OR does the patient have an upcoming appointment? Yes.    Agent: Please be advised that RX refills may take up to 3 business days. We ask that you follow-up with your pharmacy.

## 2022-03-27 ENCOUNTER — Ambulatory Visit: Payer: 59 | Admitting: Nurse Practitioner

## 2022-03-28 ENCOUNTER — Telehealth: Payer: Self-pay

## 2022-03-28 ENCOUNTER — Telehealth: Payer: Self-pay | Admitting: Gastroenterology

## 2022-03-28 NOTE — Telephone Encounter (Signed)
Patient is cleared to have procedure.  Clearance obtained by fax on 03/21/22 from Dr. Clayborn Bigness.  Thanks,  [place], [place]

## 2022-03-28 NOTE — Telephone Encounter (Signed)
Patient states she is sick and cannot go through with colonoscopy, wants to reschedule. Requests call back.

## 2022-03-28 NOTE — Telephone Encounter (Signed)
Patient is calling wondering if she can take her blood pressure medication today. Can you please give her a call back. Procedure is tomorrow.

## 2022-03-28 NOTE — Telephone Encounter (Signed)
Patient has been informed that she can take her blood pressure medication today and tomorrow morning with only a sip of water.  Thanks, [place], [place]

## 2022-03-29 ENCOUNTER — Ambulatory Visit: Admission: RE | Admit: 2022-03-29 | Payer: 59 | Source: Ambulatory Visit | Admitting: Gastroenterology

## 2022-03-29 ENCOUNTER — Encounter: Admission: RE | Payer: Self-pay | Source: Ambulatory Visit

## 2022-03-29 ENCOUNTER — Other Ambulatory Visit: Payer: Self-pay

## 2022-03-29 DIAGNOSIS — Z1211 Encounter for screening for malignant neoplasm of colon: Secondary | ICD-10-CM

## 2022-03-29 SURGERY — COLONOSCOPY WITH PROPOFOL
Anesthesia: General

## 2022-04-03 ENCOUNTER — Ambulatory Visit: Payer: 59 | Admitting: Dermatology

## 2022-04-06 DIAGNOSIS — E119 Type 2 diabetes mellitus without complications: Secondary | ICD-10-CM | POA: Diagnosis not present

## 2022-04-06 DIAGNOSIS — H2513 Age-related nuclear cataract, bilateral: Secondary | ICD-10-CM | POA: Diagnosis not present

## 2022-04-06 LAB — HM DIABETES EYE EXAM

## 2022-04-10 ENCOUNTER — Telehealth: Payer: Self-pay

## 2022-04-10 NOTE — Telephone Encounter (Signed)
Patient contacted office stated she ate some nuts when she was not supposed to.  Colonoscopy has been rescheduled to 04/18/22 still with Dr. Vicente Males at [facility]. Trish iin Endo has been informed of the date change.  Patient has been advised to stop Janumet on 04/16/22.  Low Fiber Diet 01/28 and 01/29.  Clear Liquids 01/30.  Pt verbalized understanding of these instructions and noted it on her instruction page.  Thanks, [place], [place]

## 2022-04-12 ENCOUNTER — Encounter: Payer: Self-pay | Admitting: Nurse Practitioner

## 2022-04-17 ENCOUNTER — Encounter: Payer: Self-pay | Admitting: Gastroenterology

## 2022-04-18 ENCOUNTER — Other Ambulatory Visit: Payer: Self-pay

## 2022-04-18 ENCOUNTER — Ambulatory Visit: Payer: 59 | Admitting: Anesthesiology

## 2022-04-18 ENCOUNTER — Ambulatory Visit
Admission: RE | Admit: 2022-04-18 | Discharge: 2022-04-18 | Disposition: A | Discharge status: Home / Self Care | Payer: 59 | Attending: Gastroenterology | Admitting: Gastroenterology

## 2022-04-18 ENCOUNTER — Encounter
Admission: RE | Disposition: A | Discharge status: Home / Self Care | Payer: Self-pay | Source: Home / Self Care | Attending: Gastroenterology

## 2022-04-18 ENCOUNTER — Encounter: Payer: Self-pay | Admitting: Gastroenterology

## 2022-04-18 DIAGNOSIS — E119 Type 2 diabetes mellitus without complications: Secondary | ICD-10-CM | POA: Insufficient documentation

## 2022-04-18 DIAGNOSIS — Z1211 Encounter for screening for malignant neoplasm of colon: Secondary | ICD-10-CM | POA: Diagnosis not present

## 2022-04-18 DIAGNOSIS — D126 Benign neoplasm of colon, unspecified: Secondary | ICD-10-CM | POA: Diagnosis not present

## 2022-04-18 DIAGNOSIS — Z79899 Other long term (current) drug therapy: Secondary | ICD-10-CM | POA: Diagnosis not present

## 2022-04-18 DIAGNOSIS — Z87891 Personal history of nicotine dependence: Secondary | ICD-10-CM | POA: Insufficient documentation

## 2022-04-18 DIAGNOSIS — K5730 Diverticulosis of large intestine without perforation or abscess without bleeding: Secondary | ICD-10-CM | POA: Diagnosis not present

## 2022-04-18 DIAGNOSIS — I10 Essential (primary) hypertension: Secondary | ICD-10-CM | POA: Diagnosis not present

## 2022-04-18 DIAGNOSIS — D125 Benign neoplasm of sigmoid colon: Secondary | ICD-10-CM | POA: Diagnosis not present

## 2022-04-18 DIAGNOSIS — K635 Polyp of colon: Secondary | ICD-10-CM | POA: Diagnosis not present

## 2022-04-18 DIAGNOSIS — E1165 Type 2 diabetes mellitus with hyperglycemia: Secondary | ICD-10-CM | POA: Diagnosis not present

## 2022-04-18 HISTORY — PX: COLONOSCOPY WITH PROPOFOL: SHX5780

## 2022-04-18 HISTORY — DX: Dyspnea, unspecified: R06.00

## 2022-04-18 HISTORY — DX: Unspecified asthma, uncomplicated: J45.909

## 2022-04-18 LAB — GLUCOSE, CAPILLARY: Glucose-Capillary: 197 mg/dL — ABNORMAL HIGH (ref 70–99)

## 2022-04-18 SURGERY — COLONOSCOPY WITH PROPOFOL
Anesthesia: General

## 2022-04-18 MED ORDER — PROPOFOL 10 MG/ML IV BOLUS
INTRAVENOUS | Status: DC | PRN
  Administered 2022-04-18: 50 mg via INTRAVENOUS
  Administered 2022-04-18: 150 ug/kg/min via INTRAVENOUS

## 2022-04-18 MED ORDER — STERILE WATER FOR IRRIGATION IR SOLN
Status: DC | PRN
  Administered 2022-04-18: 100 mL

## 2022-04-18 MED ORDER — EPHEDRINE SULFATE (PRESSORS) 50 MG/ML IJ SOLN
INTRAMUSCULAR | Status: DC | PRN
  Administered 2022-04-18: 5 mg via INTRAVENOUS

## 2022-04-18 MED ORDER — SODIUM CHLORIDE 0.9 % IV SOLN: INTRAVENOUS | Status: DC

## 2022-04-18 MED ORDER — SODIUM CHLORIDE 0.9 % IV SOLN: INTRAVENOUS | Status: DC | PRN

## 2022-04-18 NOTE — Anesthesia Preprocedure Evaluation (Signed)
Anesthesia Evaluation  Patient identified by MRN, date of birth, ID band Patient awake    Reviewed: Allergy & Precautions, H&P , NPO status , Patient's Chart, lab work & pertinent test results, reviewed documented beta blocker date and time   Airway Mallampati: II  TM Distance: >3 FB Neck ROM: full    Dental no notable dental hx.    Pulmonary neg pulmonary ROS, former smoker   Pulmonary exam normal        Cardiovascular hypertension, Pt. on home beta blockers and Pt. on medications Normal cardiovascular exam  Myocardial perfusion scan: LVEF= 47 %  FINDINGS:  Regional wall motion:  demonstrates  hypokinesis of the global walls  mildly.  The overall quality of the study is poor.   Artifacts noted: yes  Left ventricular cavity: normal.  Perfusion Analysis:  SPECT images demonstrate homogeneous tracer  distribution throughout the myocardium. Defect type : Normal     10/2021: CT coronary scan IMPRESSION: 1. Coronary calcium score of 21.3. This was 72nd percentile for age and sex matched control. 2. Normal coronary origin with right dominance. 3.  Minimal LAD calcifications (<25%). 4. CAD-RADS 1. Minimal non-obstructive CAD (0-24%). Consider non-atherosclerotic causes of chest pain. Consider preventive therapy and risk factor modification.    Neuro/Psych  Headaches  negative psych ROS   GI/Hepatic Neg liver ROS,GERD  ,,  Endo/Other  diabetes, Poorly Controlled, Type 2    Renal/GU negative Renal ROS  negative genitourinary   Musculoskeletal   Abdominal   Peds  Hematology negative hematology ROS (+)   Anesthesia Other Findings Past Medical History: No date: Diabetes mellitus without complication ([place])  Past Surgical History: No date: TOTAL ABDOMINAL HYSTERECTOMY     Reproductive/Obstetrics negative OB ROS                             Anesthesia Physical Anesthesia Plan  ASA:  3  Anesthesia Plan: General   Post-op Pain Management:    Induction:   PONV Risk Score and Plan: Propofol infusion and TIVA  Airway Management Planned:   Additional Equipment:   Intra-op Plan:   Post-operative Plan:   Informed Consent:      Dental Advisory Given  Plan Discussed with: CRNA and Surgeon  Anesthesia Plan Comments:         Anesthesia Quick Evaluation

## 2022-04-18 NOTE — Transfer of Care (Signed)
Immediate Anesthesia Transfer of Care Note  Patient: Lacey Schaefer  Procedure(s) Performed: COLONOSCOPY WITH PROPOFOL  Patient Location: PACU  Anesthesia Type:MAC  Level of Consciousness: awake  Airway & Oxygen Therapy: Patient Spontanous Breathing and Patient connected to face mask oxygen  Post-op Assessment: Report given to RN and Post -op Vital signs reviewed and stable  Post vital signs: Reviewed  Last Vitals:  Vitals Value Taken Time  BP 124/63 04/18/22 1331  Temp 36.2 C 04/18/22 1330  Pulse 80 04/18/22 1331  Resp 26 04/18/22 1331  SpO2 100 % 04/18/22 1331  Vitals shown include unvalidated device data.  Last Pain:  Vitals:   04/18/22 1330  TempSrc: Temporal  PainSc: 0-No pain         Complications: No notable events documented.

## 2022-04-18 NOTE — Op Note (Signed)
[facility] Gastroenterology Patient Name: Lacey Schaefer Procedure Date: 04/18/2022 12:40 PM MRN: [id number] Account #: [id number] Date of Birth: [birth year] Admit Type: Outpatient Age: 62 Room: [facility] ENDO ROOM 3 Gender: Female Note Status: Finalized Instrument Name: Jasper Riling [id number] Procedure:             Colonoscopy Indications:           Screening for colorectal malignant neoplasm Providers:             Jonathon Bellows MD, MD Referring MD:          Jon Billings (Referring MD) Medicines:             Monitored Anesthesia Care Complications:         No immediate complications. Procedure:             Pre-Anesthesia Assessment:                        - Prior to the procedure, a History and Physical was                         performed, and patient medications, allergies and                         sensitivities were reviewed. The patient's tolerance                         of previous anesthesia was reviewed.                        - The risks and benefits of the procedure and the                         sedation options and risks were discussed with the                         patient. All questions were answered and informed                         consent was obtained.                        - ASA Grade Assessment: II - A patient with mild                         systemic disease.                        After obtaining informed consent, the colonoscope was                         passed under direct vision. Throughout the procedure,                         the patient's blood pressure, pulse, and oxygen                         saturations were monitored continuously. The                         Colonoscope was introduced through  the anus and                         advanced to the the cecum, identified by the                         appendiceal orifice. The colonoscopy was performed                         with ease. The patient tolerated the procedure well.                          The quality of the bowel preparation was excellent.                         The ileocecal valve, appendiceal orifice, and rectum                         were photographed. Findings:      The perianal and digital rectal examinations were normal.      A 20 mm polyp was found in the sigmoid colon. The polyp was       pedunculated. The polyp was removed with a hot snare. Resection and       retrieval were complete. To prevent bleeding after the polypectomy, two       hemostatic clips were successfully placed. There was no bleeding at the       end of the procedure.      The exam was otherwise without abnormality on direct and retroflexion       views.      A few small-mouthed diverticula were found in the entire colon.      The exam was otherwise without abnormality on direct and retroflexion       views. Impression:            - One 20 mm polyp in the sigmoid colon, removed with a                         hot snare. Resected and retrieved. Clips were placed.                        - The examination was otherwise normal on direct and                         retroflexion views.                        - Diverticulosis in the entire examined colon.                        - The examination was otherwise normal on direct and                         retroflexion views. Recommendation:        - Discharge patient to home (with escort).                        - Resume previous diet.                        -  Continue present medications.                        - Await pathology results.                        - Repeat colonoscopy in 3 years for surveillance based                         on pathology results. Procedure Code(s):     --- Professional ---                        [phone], Colonoscopy, flexible; with removal of                         tumor(s), polyp(s), or other lesion(s) by snare                         technique Diagnosis Code(s):     --- Professional ---                         Z12.11, Encounter for screening for malignant neoplasm                         of colon                        D12.5, Benign neoplasm of sigmoid colon                        K57.30, Diverticulosis of large intestine without                         perforation or abscess without bleeding CPT copyright 2022 American Medical Association. All rights reserved. The codes documented in this report are preliminary and upon coder review may  be revised to meet current compliance requirements. Jonathon Bellows, MD Jonathon Bellows MD, MD 04/18/2022 1:26:11 PM This report has been signed electronically. Number of Addenda: 0 Note Initiated On: 04/18/2022 12:40 PM Scope Withdrawal Time: 0 hours 10 minutes 52 seconds  Total Procedure Duration: 0 hours 13 minutes 22 seconds  Estimated Blood Loss:  Estimated blood loss: none.      [facility]

## 2022-04-18 NOTE — H&P (Signed)
Jonathon Bellows, MD [address] [place], [place], [place], [address] [address], [place], [place], [place], [address] Phone: [phone]  Fax: [phone]  Primary Care Physician:  Jon Billings, NP   Pre-Procedure History & Physical: HPI:  Lacey Schaefer is a 62 y.o. female is here for an colonoscopy.   Past Medical History:  Diagnosis Date   Asthma    Diabetes mellitus without complication ([place])    Dyspnea    Hypertension     Past Surgical History:  Procedure Laterality Date   COLONOSCOPY     CYSTECTOMY Left    shoulder   TOTAL ABDOMINAL HYSTERECTOMY      Prior to Admission medications   Medication Sig Start Date End Date Taking? Authorizing Provider  albuterol (VENTOLIN HFA) 108 (90 Base) MCG/ACT inhaler Inhale 2 puffs into the lungs every 6 (six) hours as needed for wheezing or shortness of breath. 07/22/19  Yes Volney American, PA-C  amLODipine (NORVASC) 10 MG tablet TAKE 1 TABLET BY MOUTH EVERY DAY 04/16/21  Yes Cannady, Jolene T, NP  aspirin 81 MG EC tablet Take by mouth daily.    Yes [provider]  butalbital-acetaminophen-caffeine (FIORICET) 50-325-40 MG tablet TAKE 1 TABLET BY MOUTH EVERY 6 HOURS AS NEEDED FOR HEADACHE 01/30/22  Yes Jon Billings, NP  carvedilol (COREG) 3.125 MG tablet Take 1 tablet (3.125 mg total) by mouth 2 (two) times daily with a meal. 01/29/22  Yes Jon Billings, NP  hydrALAZINE (APRESOLINE) 50 MG tablet Take 100 mg by mouth 2 (two) times daily. 05/29/18  Yes [provider]  hydrochlorothiazide (HYDRODIURIL) 25 MG tablet TAKE 1 TABLET (25 MG TOTAL) BY MOUTH DAILY. Patient taking differently: Take 12.5 mg by mouth daily. 07/10/21  Yes Jon Billings, NP  PATADAY 0.7 % SOLN Apply to eye. 04/29/21  Yes [provider]  potassium chloride SA (KLOR-CON M) 20 MEQ tablet Take 1 tablet (20 mEq total) by mouth daily. 03/22/22  Yes Mecum, [name] E, PA-C  pravastatin (PRAVACHOL) 40 MG tablet TAKE 1  TABLET BY MOUTH EVERY DAY 02/07/21  Yes Jon Billings, NP  sitaGLIPtin-metformin (JANUMET) 50-1000 MG tablet Take 1 tablet by mouth 2 (two) times daily. 12/14/21  Yes Jon Billings, NP    Allergies as of 03/29/2022 - Review Complete 03/05/2022  Allergen Reaction Noted   Sulfasalazine Hives 08/24/2019   Elemental sulfur Other (See Comments) 01/09/2018   Metoprolol Other (See Comments) and Cough 01/02/2019   Sulfa antibiotics Hives 08/25/2014   Lisinopril Cough 01/02/2019    Family History  Problem Relation Age of Onset   Diabetes Mother    Prostate cancer Father    Heart disease Sister    Prostate cancer Brother    Prostate cancer Maternal Grandfather    Prostate cancer Brother     Social History   Socioeconomic History   Marital status: Married    Spouse name: Not on file   Number of children: Not on file   Years of education: Not on file   Highest education level: Not on file  Occupational History   Not on file  Tobacco Use   Smoking status: Some Days    Types: Cigarettes   Smokeless tobacco: Never  Vaping Use   Vaping Use: Never used  Substance and Sexual Activity   Alcohol use: No   Drug use: No   Sexual activity: Yes  Other Topics Concern   Not on file  Social History Narrative   Not on  file   Social Determinants of Health   Financial Resource Strain: Not on file  Food Insecurity: Not on file  Transportation Needs: Not on file  Physical Activity: Not on file  Stress: Not on file  Social Connections: Not on file  Intimate Partner Violence: Not on file    Review of Systems: See HPI, otherwise negative ROS  Physical Exam: BP (!) 155/89   Pulse 74   Temp (!) 96.9 F (36.1 C) (Temporal)   Resp 18   Ht '5\' 4"'$  (1.626 m)   Wt 79.4 kg   SpO2 100%   BMI 30.04 kg/m  General:   Alert,  pleasant and cooperative in NAD Head:  Normocephalic and atraumatic. Neck:  Supple; no masses or thyromegaly. Lungs:  Clear throughout to auscultation, normal  respiratory effort.    Heart:  +S1, +S2, Regular rate and rhythm, No edema. Abdomen:  Soft, nontender and nondistended. Normal bowel sounds, without guarding, and without rebound.   Neurologic:  Alert and  oriented x4;  grossly normal neurologically.  Impression/Plan: Lacey Schaefer is here for an colonoscopy to be performed for Screening colonoscopy average risk   Risks, benefits, limitations, and alternatives regarding  colonoscopy have been reviewed with the patient.  Questions have been answered.  All parties agreeable.   Jonathon Bellows, MD  04/18/2022, 12:41 PM

## 2022-04-19 ENCOUNTER — Encounter: Payer: Self-pay | Admitting: Gastroenterology

## 2022-04-19 LAB — SURGICAL PATHOLOGY

## 2022-04-19 NOTE — Anesthesia Postprocedure Evaluation (Signed)
Anesthesia Post Note  Patient: Lacey Schaefer  Procedure(s) Performed: COLONOSCOPY WITH PROPOFOL  Patient location during evaluation: PACU Anesthesia Type: General Level of consciousness: awake and alert Pain management: pain level controlled Vital Signs Assessment: post-procedure vital signs reviewed and stable Respiratory status: spontaneous breathing, nonlabored ventilation and respiratory function stable Cardiovascular status: blood pressure returned to baseline and stable Postop Assessment: no apparent nausea or vomiting Anesthetic complications: no   No notable events documented.   Last Vitals:  Vitals:   04/18/22 1330 04/18/22 1350  BP: 124/63 (!) 158/88  Pulse: 78   Resp: (!) 27   Temp: (!) 36.2 C   SpO2: 100%     Last Pain:  Vitals:   04/19/22 0728  TempSrc:   PainSc: 0-No pain                 [place] Ouch

## 2022-04-24 ENCOUNTER — Encounter: Payer: Self-pay | Admitting: Gastroenterology

## 2022-04-26 ENCOUNTER — Telehealth: Payer: Self-pay | Admitting: Nurse Practitioner

## 2022-04-26 NOTE — Telephone Encounter (Signed)
Pt came in wanting a follow up on referral to [facility] Physical therapy as she said that she heard nothing from them and a script sent for mammogram.  Please advise.

## 2022-04-27 NOTE — Telephone Encounter (Signed)
Evelena Peat, can you check on this physical therapy referral please?

## 2022-04-29 ENCOUNTER — Other Ambulatory Visit: Payer: Self-pay | Admitting: Nurse Practitioner

## 2022-04-30 NOTE — Telephone Encounter (Signed)
Requested medication (s) are due for refill today: yes  Requested medication (s) are on the active medication list: yes  Last refill:  01/31/22  Future visit scheduled: yes  Notes to clinic:  Unable to refill per protocol, cannot delegate.      Requested Prescriptions  Pending Prescriptions Disp Refills   butalbital-acetaminophen-caffeine (FIORICET) 50-325-40 MG tablet [Pharmacy Med Name: Butalbital-APAP-Caffeine 50-325-40 MG Oral Tablet] 14 tablet 0    Sig: TAKE 1 TABLET BY MOUTH EVERY 6 HOURS AS NEEDED FOR HEADACHE     Not Delegated - Analgesics:  Non-Opioid Analgesic Combinations 2 Failed - 04/29/2022  2:19 PM      Failed - This refill cannot be delegated      Passed - Cr in normal range and within 360 days    Creatinine, Ser  Date Value Ref Range Status  03/05/2022 0.82 0.57 - 1.00 mg/dL Final         Passed - eGFR is 10 or above and within 360 days    GFR calc Af Amer  Date Value Ref Range Status  04/22/2020 106 >59 mL/min/1.73 Final    Comment:    **In accordance with recommendations from the NKF-ASN Task force,**   [facility] is in the process of updating its eGFR calculation to the   2021 CKD-EPI creatinine equation that estimates kidney function   without a race variable.    GFR, Estimated  Date Value Ref Range Status  10/30/2021 >60 >60 mL/min Final    Comment:    (NOTE) Calculated using the CKD-EPI Creatinine Equation (2021)    eGFR  Date Value Ref Range Status  03/05/2022 81 >59 mL/min/1.73 Final         Passed - Patient is not pregnant      Passed - Valid encounter within last 12 months    Recent Outpatient Visits           1 month ago Annual physical exam   [place] Jon Billings, NP   6 months ago Chronic migraine without aura without status migrainosus, not intractable   [place] [facility] Mecum, [name] E, PA-C   8 months ago Hyperlipidemia associated with type 2 diabetes mellitus [facility])   [place] Jon Billings, NP   1 year ago Type 2 diabetes mellitus with hyperglycemia, without long-term current use of insulin [facility])   [place], NP   1 year ago Acute left-sided thoracic back pain   [place] Jon Billings, NP       Future Appointments             In 4 months Jon Billings, NP [place], [facility]

## 2022-05-10 ENCOUNTER — Ambulatory Visit: Payer: 59 | Admitting: Nurse Practitioner

## 2022-05-11 ENCOUNTER — Other Ambulatory Visit: Payer: Self-pay | Admitting: Physician Assistant

## 2022-05-11 ENCOUNTER — Telehealth: Payer: Self-pay | Admitting: Nurse Practitioner

## 2022-05-11 NOTE — Telephone Encounter (Signed)
Form has been completed and faxed back in to [facility].

## 2022-05-11 NOTE — Telephone Encounter (Signed)
Kim with [facility] is calling in to follow up on a fax that was sent to the practice in regards to a [employer] 3.

## 2022-05-11 NOTE — Telephone Encounter (Signed)
Requested Prescriptions  Pending Prescriptions Disp Refills   potassium chloride SA (KLOR-CON M) 20 MEQ tablet [Pharmacy Med Name: Potassium Chloride Crys ER 20 MEQ Oral Tablet Extended Release] 30 tablet 0    Sig: Take 1 tablet by mouth once daily     Endocrinology:  Minerals - Potassium Supplementation Passed - 05/11/2022 11:51 AM      Passed - K in normal range and within 360 days    Potassium  Date Value Ref Range Status  03/05/2022 3.7 3.5 - 5.2 mmol/L Final         Passed - Cr in normal range and within 360 days    Creatinine, Ser  Date Value Ref Range Status  03/05/2022 0.82 0.57 - 1.00 mg/dL Final         Passed - Valid encounter within last 12 months    Recent Outpatient Visits           2 months ago Annual physical exam   [place], Karen, NP   6 months ago Chronic migraine without aura without status migrainosus, not intractable   [place] [facility] Mecum, [name] E, PA-C   9 months ago Hyperlipidemia associated with type 2 diabetes mellitus [facility])   [place] Jon Billings, NP   1 year ago Type 2 diabetes mellitus with hyperglycemia, without long-term current use of insulin ([place])   [place] Jon Billings, NP   1 year ago Acute left-sided thoracic back pain   [place] Jon Billings, NP       Future Appointments             In 5 days Jon Billings, NP [place], [place]   In 3 months Jon Billings, NP [place], [facility]

## 2022-05-16 ENCOUNTER — Ambulatory Visit (INDEPENDENT_AMBULATORY_CARE_PROVIDER_SITE_OTHER): Payer: 59 | Admitting: Nurse Practitioner

## 2022-05-16 ENCOUNTER — Encounter: Payer: Self-pay | Admitting: Nurse Practitioner

## 2022-05-16 VITALS — BP 120/70 | HR 79 | Temp 99.4°F | Wt 182.2 lb

## 2022-05-16 DIAGNOSIS — E278 Other specified disorders of adrenal gland: Secondary | ICD-10-CM

## 2022-05-16 DIAGNOSIS — E1165 Type 2 diabetes mellitus with hyperglycemia: Secondary | ICD-10-CM

## 2022-05-16 DIAGNOSIS — Z23 Encounter for immunization: Secondary | ICD-10-CM

## 2022-05-16 MED ORDER — OZEMPIC (0.25 OR 0.5 MG/DOSE) 2 MG/1.5ML ~~LOC~~ SOPN: 0.2500 mg | PEN_INJECTOR | SUBCUTANEOUS | 0 refills | Status: DC

## 2022-05-16 NOTE — Assessment & Plan Note (Signed)
Chronic. Not well controlled.  Last A1c was 9.2%.  Currently taking Janumet but not tolerating it well due to diarrhea.  Will send in Ozempic 0.'25mg'$  weekly.  Follow up next week to be shown how to use medication.  Patient does not believe she had a bad reaction to a sulfa drug and would like to try Glipizide instead of starting Insulin.  A1c checked at visit today.

## 2022-05-16 NOTE — Addendum Note (Signed)
Addended by: Jon Billings on: 05/16/2022 04:34 PM   Modules accepted: Orders

## 2022-05-16 NOTE — Progress Notes (Signed)
BP 120/70 Comment: home blood pressure reading  Pulse 79   Temp 99.4 F (37.4 C) (Oral)   Wt 182 lb 3.2 oz (82.6 kg)   SpO2 98%   BMI 31.27 kg/m    Subjective:    Patient ID: Lacey Schaefer, female    DOB: [birth year], 62 y.o.   MRN: [id number]  HPI: Lacey Schaefer is a 62 y.o. female  Chief Complaint  Patient presents with   Diabetes   DIABETES Hypoglycemic episodes:no Polydipsia/polyuria: no Visual disturbance: no Chest pain: no Paresthesias: no Glucose Monitoring: yes  Accucheck frequency: Daily  Fasting glucose:  Post prandial:  Evening:  Before meals: Taking Insulin?: no  Long acting insulin:  Short acting insulin: Blood Pressure Monitoring: daily Retinal Examination: Up to Date Foot Exam: Up to Date Diabetic Education: Not Completed Pneumovax: Not up to Date Influenza: Up to Date Aspirin: no  Relevant past medical, surgical, family and social history reviewed and updated as indicated. Interim medical history since our last visit reviewed. Allergies and medications reviewed and updated.  Review of Systems  Eyes:  Negative for visual disturbance.  Cardiovascular:  Negative for chest pain.  Gastrointestinal:  Positive for diarrhea.  Endocrine: Negative for polydipsia and polyuria.  Neurological:  Negative for numbness.    Per HPI unless specifically indicated above     Objective:    BP 120/70 Comment: home blood pressure reading  Pulse 79   Temp 99.4 F (37.4 C) (Oral)   Wt 182 lb 3.2 oz (82.6 kg)   SpO2 98%   BMI 31.27 kg/m   Wt Readings from Last 3 Encounters:  05/16/22 182 lb 3.2 oz (82.6 kg)  04/18/22 175 lb (79.4 kg)  03/05/22 181 lb 11.2 oz (82.4 kg)    Physical Exam Vitals and nursing note reviewed.  Constitutional:      General: She is not in acute distress.    Appearance: Normal appearance. She is normal weight. She is not ill-appearing, toxic-appearing or diaphoretic.  HENT:     Head: Normocephalic.     Right Ear: External  ear normal.     Left Ear: External ear normal.     Nose: Nose normal.     Mouth/Throat:     Mouth: Mucous membranes are moist.     Pharynx: Oropharynx is clear.  Eyes:     General:        Right eye: No discharge.        Left eye: No discharge.     Extraocular Movements: Extraocular movements intact.     Conjunctiva/sclera: Conjunctivae normal.     Pupils: Pupils are equal, round, and reactive to light.  Cardiovascular:     Rate and Rhythm: Normal rate and regular rhythm.     Heart sounds: No murmur heard. Pulmonary:     Effort: Pulmonary effort is normal. No respiratory distress.     Breath sounds: Normal breath sounds. No wheezing or rales.  Musculoskeletal:     Cervical back: Normal range of motion and neck supple.  Skin:    General: Skin is warm and dry.     Capillary Refill: Capillary refill takes less than 2 seconds.  Neurological:     General: No focal deficit present.     Mental Status: She is alert and oriented to person, place, and time. Mental status is at baseline.  Psychiatric:        Mood and Affect: Mood normal.        Behavior: Behavior  normal.        Thought Content: Thought content normal.        Judgment: Judgment normal.     Results for orders placed or performed during the hospital encounter of 04/18/22  Glucose, capillary  Result Value Ref Range   Glucose-Capillary 197 (H) 70 - 99 mg/dL  Surgical pathology  Result Value Ref Range   SURGICAL PATHOLOGY      SURGICAL PATHOLOGY CASE: [id number] PATIENT: Lacey Schaefer Surgical Pathology Report     Specimen Submitted: A. Colon polyp, sigmoid; hot snare  Clinical History: Screening colonoscopy.  Colon polyp, diverticulosis      DIAGNOSIS: A.  COLON, SIGMOID, POLYP; HOT SNARE RESECTION: - INFLAMED TUBULAR ADENOMA (SEE COMMENT). - IN SECTIONS WHERE INKED STALK MARGIN IS PRESENT IT IS NEGATIVE FOR DYSPLASIA.   Comment: Focally there is some increased gland tufting and hyperchromasia but  definite cribriforming and definite progression to high-grade dysplasia is not seen. The slides have been reviewed by intradepartmental consultation.  The reviewing pathologist agrees with the interpretation.   GROSS DESCRIPTION: A. Labeled: Hot snare polyp sigmoid colon Received: Formalin Collection time: 1:20 PM on 04/18/2022 Placed into formalin time: 1:20 PM on 04/18/2022 Tissue fragment(s): 1 Size: 1.7 x 1.6 x 1.2 cm Description: Received is a single fragment of pink-red  lobulated soft tissue.  The resection margin is inked blue and the fragment is serially sectioned. Entirely submitted in cassettes 1-2.  RB 04/18/2022  Final Diagnosis performed by Theodora Blow, MD.   Electronically signed 04/19/2022 1:04:06PM The electronic signature indicates that the named Attending Pathologist has evaluated the specimen Technical component performed at [facility], [address], [place], [place] [address] Lab: [phone] Dir: Rush Farmer, MD, MMM  Professional component performed at [facility], [facility], [place], [place], [place] [address] Lab: [phone] Dir: Kathi Simpers, MD       Assessment & Plan:   Problem List Items Addressed This Visit       Endocrine   Type 2 diabetes mellitus with hyperglycemia ([place]) - Primary    Chronic. Not well controlled.  Last A1c was 9.2%.  Currently taking Janumet but not tolerating it well due to diarrhea.  Will send in Ozempic 0.'25mg'$  weekly.  Follow up next week to be shown how to use medication.  Patient does not believe she had a bad reaction to a sulfa drug and would like to try Glipizide instead of starting Insulin.  A1c checked at visit today.       Relevant Medications   Semaglutide,0.25 or 0.'5MG'$ /DOS, (OZEMPIC, 0.25 OR 0.5 MG/DOSE,) 2 MG/1.5ML SOPN   Other Relevant Orders   HgB A1c   Other Visit Diagnoses     Adrenal mass ([place])       Follow up CT ordered per radiology's recommendation.   Relevant Orders   CT  ADRENAL ABD WO   Need for shingles vaccine       Relevant Orders   Zoster Recombinant ([facility] )        Follow up plan: Return in about 1 month (around 06/14/2022) for HTN, HLD, DM2 FU.

## 2022-05-17 ENCOUNTER — Ambulatory Visit
Admission: RE | Admit: 2022-05-17 | Discharge: 2022-05-17 | Disposition: A | Discharge status: Home / Self Care | Payer: 59 | Source: Ambulatory Visit | Attending: Nurse Practitioner | Admitting: Nurse Practitioner

## 2022-05-17 DIAGNOSIS — Z1231 Encounter for screening mammogram for malignant neoplasm of breast: Secondary | ICD-10-CM | POA: Insufficient documentation

## 2022-05-17 LAB — HEMOGLOBIN A1C
Est. average glucose Bld gHb Est-mCnc: 252 mg/dL
Hgb A1c MFr Bld: 10.4 % — ABNORMAL HIGH (ref 4.8–5.6)

## 2022-05-17 NOTE — Progress Notes (Signed)
Please let patient know that her A1c increased to 10.2.  Please let patient know that if she is not able to get the Ozempic we need to try the glipizide.

## 2022-05-18 ENCOUNTER — Telehealth: Payer: Self-pay

## 2022-05-18 NOTE — Telephone Encounter (Signed)
PA for Ozempic initiated and submitted via Cover My Meds. Key: [id number]

## 2022-05-21 MED ORDER — GLIPIZIDE 5 MG PO TABS: 5.0000 mg | ORAL_TABLET | Freq: Two times a day (BID) | ORAL | 3 refills | Status: DC

## 2022-05-21 MED ORDER — TRULICITY 0.75 MG/0.5ML ~~LOC~~ SOAJ: 0.7500 mg | SUBCUTANEOUS | 0 refills | Status: DC

## 2022-05-21 NOTE — Progress Notes (Signed)
Medications sent to the pharmacy.

## 2022-05-21 NOTE — Addendum Note (Signed)
Addended by: Jon Billings on: 05/21/2022 10:59 AM   Modules accepted: Orders

## 2022-05-21 NOTE — Telephone Encounter (Signed)
PA denied. Patient was notified and medication changed. See result note.

## 2022-05-22 ENCOUNTER — Other Ambulatory Visit: Payer: Self-pay | Admitting: Nurse Practitioner

## 2022-05-22 DIAGNOSIS — N63 Unspecified lump in unspecified breast: Secondary | ICD-10-CM

## 2022-05-22 DIAGNOSIS — R928 Other abnormal and inconclusive findings on diagnostic imaging of breast: Secondary | ICD-10-CM

## 2022-05-22 NOTE — Progress Notes (Signed)
Please let patient know that her mammogram was abnormal.  The breast center will be reaching out to schedule further imaging.

## 2022-05-30 ENCOUNTER — Ambulatory Visit
Admission: RE | Admit: 2022-05-30 | Discharge: 2022-05-30 | Disposition: A | Discharge status: Home / Self Care | Payer: 59 | Source: Ambulatory Visit | Attending: Nurse Practitioner | Admitting: Nurse Practitioner

## 2022-05-30 DIAGNOSIS — N63 Unspecified lump in unspecified breast: Secondary | ICD-10-CM

## 2022-05-30 DIAGNOSIS — R928 Other abnormal and inconclusive findings on diagnostic imaging of breast: Secondary | ICD-10-CM

## 2022-05-31 NOTE — Progress Notes (Signed)
Please let patient know her Mammogram did not show any evidence of a malignancy.  The recommendation is to repeat the Mammogram in 1 year.  

## 2022-06-01 ENCOUNTER — Telehealth: Payer: Self-pay | Admitting: Nurse Practitioner

## 2022-06-01 NOTE — Telephone Encounter (Signed)
Copied from [place] [phone]. Topic: General - Inquiry >> Jun 01, 2022  3:50 PM [name] wrote: Reason for CRM: Pt stated that she wanted to let [place] know that she cannot afford the medication for diabetes and that it is still $936.00 out of pocket. She stated she would see her endocrinology doctor regarding her diabetes, and she did pick up the medication Glipizide. Pt still requested a callback from [place] if possible.  Please advise.

## 2022-06-12 DIAGNOSIS — E119 Type 2 diabetes mellitus without complications: Secondary | ICD-10-CM | POA: Diagnosis not present

## 2022-06-12 DIAGNOSIS — R079 Chest pain, unspecified: Secondary | ICD-10-CM | POA: Diagnosis not present

## 2022-06-12 DIAGNOSIS — E669 Obesity, unspecified: Secondary | ICD-10-CM | POA: Diagnosis not present

## 2022-06-12 DIAGNOSIS — E782 Mixed hyperlipidemia: Secondary | ICD-10-CM | POA: Diagnosis not present

## 2022-06-12 DIAGNOSIS — R002 Palpitations: Secondary | ICD-10-CM | POA: Diagnosis not present

## 2022-06-12 DIAGNOSIS — I10 Essential (primary) hypertension: Secondary | ICD-10-CM | POA: Diagnosis not present

## 2022-06-14 ENCOUNTER — Ambulatory Visit: Payer: Self-pay | Admitting: Nurse Practitioner

## 2022-06-19 ENCOUNTER — Telehealth: Payer: Self-pay | Admitting: Nurse Practitioner

## 2022-06-19 NOTE — Telephone Encounter (Signed)
Pt is calling in because she says she had an Xray a year ago and wanted to know if [name] could send another order for an Xray to follow up. Pt says her insurance is requesting it. Please follow up with pt.

## 2022-06-19 NOTE — Telephone Encounter (Signed)
Called the pt and she stated that she will just get the order for the xray from her other provider.

## 2022-06-19 NOTE — Telephone Encounter (Signed)
Patient needs an appointment to discuss with provider and have xray ordered. [name] has never ordered an xray for the patient. Please call to schedule.

## 2022-06-25 ENCOUNTER — Ambulatory Visit (INDEPENDENT_AMBULATORY_CARE_PROVIDER_SITE_OTHER): Payer: 59 | Admitting: Family Medicine

## 2022-06-25 ENCOUNTER — Encounter: Payer: Self-pay | Admitting: Family Medicine

## 2022-06-25 VITALS — BP 137/72 | HR 73 | Temp 99.2°F | Ht 64.0 in | Wt 173.7 lb

## 2022-06-25 DIAGNOSIS — E1165 Type 2 diabetes mellitus with hyperglycemia: Secondary | ICD-10-CM

## 2022-06-25 DIAGNOSIS — I152 Hypertension secondary to endocrine disorders: Secondary | ICD-10-CM | POA: Diagnosis not present

## 2022-06-25 DIAGNOSIS — E1159 Type 2 diabetes mellitus with other circulatory complications: Secondary | ICD-10-CM

## 2022-06-25 DIAGNOSIS — E278 Other specified disorders of adrenal gland: Secondary | ICD-10-CM | POA: Diagnosis not present

## 2022-06-25 MED ORDER — CARVEDILOL 3.125 MG PO TABS: 3.1250 mg | ORAL_TABLET | Freq: Two times a day (BID) | ORAL | 1 refills | Status: DC

## 2022-06-25 MED ORDER — AMLODIPINE BESYLATE 10 MG PO TABS: 10.0000 mg | ORAL_TABLET | Freq: Every day | ORAL | 1 refills | Status: DC

## 2022-06-25 NOTE — Assessment & Plan Note (Signed)
Has started her trulicity and tolerating it well. Saw endocrinology. Tolerating glipizide well. No concerns. Continue current regimen. Due for recheck on A1c at the end of May. Call with any concerns.

## 2022-06-25 NOTE — Assessment & Plan Note (Signed)
Under good control on current regimen. Continue current regimen. Continue to monitor. Call with any concerns. Refills given. Labs drawn today to see if she needs to continue her potassium. Call with any concerns.

## 2022-06-25 NOTE — Progress Notes (Signed)
BP 137/72 (BP Location: Left Arm, Cuff Size: Normal)   Pulse 73   Temp 99.2 F (37.3 C) (Oral)   Ht 5\' 4"  (1.626 m)   Wt 173 lb 11.2 oz (78.8 kg)   SpO2 99%   BMI 29.82 kg/m    Subjective:    Patient ID: Lacey Schaefer, female    DOB: 07-05-1960, 62 y.o.   MRN: [id number]  HPI: Lacey Schaefer is a 62 y.o. female  Chief Complaint  Patient presents with   Diabetes   Hyperlipidemia   Hypertension   DIABETES Hypoglycemic episodes:no Polydipsia/polyuria: no Visual disturbance: no Chest pain: no Paresthesias: no Glucose Monitoring: yes  Accucheck frequency: Daily Taking Insulin?: no Blood Pressure Monitoring: not checking Retinal Examination: Up to Date Foot Exam: Up to Date Diabetic Education: Completed Pneumovax: Up to Date Influenza: Up to Date Aspirin: yes  HYPERTENSION  Hypertension status: controlled  Satisfied with current treatment? yes Duration of hypertension: chronic BP monitoring frequency:  not checking BP medication side effects:  no Medication compliance: excellent compliance Previous BP meds:amlodipine, hydralazine, carvedilol, hctz Aspirin: no Recurrent headaches: no Visual changes: no Palpitations: no Dyspnea: no Chest pain: no Lower extremity edema: no Dizzy/lightheaded: no   Relevant past medical, surgical, family and social history reviewed and updated as indicated. Interim medical history since our last visit reviewed. Allergies and medications reviewed and updated.  Review of Systems  Respiratory: Negative.    Cardiovascular: Negative.   Gastrointestinal: Negative.   Musculoskeletal: Negative.   Neurological: Negative.   Psychiatric/Behavioral: Negative.      Per HPI unless specifically indicated above     Objective:    BP 137/72 (BP Location: Left Arm, Cuff Size: Normal)   Pulse 73   Temp 99.2 F (37.3 C) (Oral)   Ht 5\' 4"  (1.626 m)   Wt 173 lb 11.2 oz (78.8 kg)   SpO2 99%   BMI 29.82 kg/m   Wt Readings from Last  3 Encounters:  06/25/22 173 lb 11.2 oz (78.8 kg)  05/16/22 182 lb 3.2 oz (82.6 kg)  04/18/22 175 lb (79.4 kg)    Physical Exam Vitals and nursing note reviewed.  Constitutional:      General: She is not in acute distress.    Appearance: Normal appearance. She is not ill-appearing, toxic-appearing or diaphoretic.  HENT:     Head: Normocephalic and atraumatic.     Right Ear: External ear normal.     Left Ear: External ear normal.     Nose: Nose normal.     Mouth/Throat:     Mouth: Mucous membranes are moist.     Pharynx: Oropharynx is clear.  Eyes:     General: No scleral icterus.       Right eye: No discharge.        Left eye: No discharge.     Extraocular Movements: Extraocular movements intact.     Conjunctiva/sclera: Conjunctivae normal.     Pupils: Pupils are equal, round, and reactive to light.  Cardiovascular:     Rate and Rhythm: Normal rate and regular rhythm.     Pulses: Normal pulses.     Heart sounds: Normal heart sounds. No murmur heard.    No friction rub. No gallop.  Pulmonary:     Effort: Pulmonary effort is normal. No respiratory distress.     Breath sounds: Normal breath sounds. No stridor. No wheezing, rhonchi or rales.  Chest:     Chest wall: No tenderness.  Musculoskeletal:  General: Normal range of motion.     Cervical back: Normal range of motion and neck supple.  Skin:    General: Skin is warm and dry.     Capillary Refill: Capillary refill takes less than 2 seconds.     Coloration: Skin is not jaundiced or pale.     Findings: No bruising, erythema, lesion or rash.  Neurological:     General: No focal deficit present.     Mental Status: She is alert and oriented to person, place, and time. Mental status is at baseline.  Psychiatric:        Mood and Affect: Mood normal.        Behavior: Behavior normal.        Thought Content: Thought content normal.        Judgment: Judgment normal.     Results for orders placed or performed in visit  on 05/16/22  HgB A1c  Result Value Ref Range   Hgb A1c MFr Bld 10.4 (H) 4.8 - 5.6 %   Est. average glucose Bld gHb Est-mCnc 252 mg/dL      Assessment & Plan:   Problem List Items Addressed This Visit       Cardiovascular and Mediastinum   Hypertension associated with diabetes    Under good control on current regimen. Continue current regimen. Continue to monitor. Call with any concerns. Refills given. Labs drawn today to see if she needs to continue her potassium. Call with any concerns.        Relevant Medications   carvedilol (COREG) 3.125 MG tablet   amLODipine (NORVASC) 10 MG tablet   Other Relevant Orders   Basic metabolic panel     Endocrine   Type 2 diabetes mellitus with hyperglycemia - Primary    Has started her trulicity and tolerating it well. Saw endocrinology. Tolerating glipizide well. No concerns. Continue current regimen. Due for recheck on A1c at the end of May. Call with any concerns.      Other Visit Diagnoses     Adrenal mass       Due for follow up imaging. Ordered today.   Relevant Orders   CT ADRENAL ABD WO        Follow up plan: Return in about 2 months (around 08/25/2022) for with Clydie Braun.

## 2022-06-26 LAB — BASIC METABOLIC PANEL
BUN/Creatinine Ratio: 20 (ref 12–28)
BUN: 16 mg/dL (ref 8–27)
CO2: 26 mmol/L (ref 20–29)
Calcium: 9.6 mg/dL (ref 8.7–10.3)
Chloride: 100 mmol/L (ref 96–106)
Creatinine, Ser: 0.8 mg/dL (ref 0.57–1.00)
Glucose: 384 mg/dL — ABNORMAL HIGH (ref 70–99)
Potassium: 3.7 mmol/L (ref 3.5–5.2)
Sodium: 141 mmol/L (ref 134–144)
eGFR: 83 mL/min/{1.73_m2} (ref >59)

## 2022-07-04 ENCOUNTER — Other Ambulatory Visit: Payer: Self-pay | Admitting: Physician Assistant

## 2022-07-05 ENCOUNTER — Ambulatory Visit: Payer: 59

## 2022-07-05 NOTE — Telephone Encounter (Signed)
Requested Prescriptions  Pending Prescriptions Disp Refills   potassium chloride SA (KLOR-CON M) 20 MEQ tablet [Pharmacy Med Name: Potassium Chloride Crys ER 20 MEQ Oral Tablet Extended Release] 90 tablet 0    Sig: Take 1 tablet by mouth once daily     Endocrinology:  Minerals - Potassium Supplementation Passed - 07/04/2022  3:27 PM      Passed - K in normal range and within 360 days    Potassium  Date Value Ref Range Status  06/25/2022 3.7 3.5 - 5.2 mmol/L Final         Passed - Cr in normal range and within 360 days    Creatinine, Ser  Date Value Ref Range Status  06/25/2022 0.80 0.57 - 1.00 mg/dL Final         Passed - Valid encounter within last 12 months    Recent Outpatient Visits           1 week ago Type 2 diabetes mellitus with hyperglycemia, without long-term current use of insulin   [place] [facility] Gratz, Megan P, DO   1 month ago Type 2 diabetes mellitus with hyperglycemia, without long-term current use of insulin (HCC)   [place] [facility] Larae Grooms, NP   4 months ago Annual physical exam   [place] [facility] Larae Grooms, NP   8 months ago Chronic migraine without aura without status migrainosus, not intractable   [place] [facility] Mecum, [name] E, PA-C   11 months ago Hyperlipidemia associated with type 2 diabetes mellitus [facility])   [place] [facility] Larae Grooms, NP       Future Appointments             In 1 month Laural Benes, Oralia Rud, DO [place] [facility]

## 2022-07-06 ENCOUNTER — Ambulatory Visit
Admission: RE | Admit: 2022-07-06 | Discharge: 2022-07-06 | Disposition: A | Discharge status: Home / Self Care | Payer: 59 | Source: Ambulatory Visit | Attending: Family Medicine | Admitting: Family Medicine

## 2022-07-06 ENCOUNTER — Other Ambulatory Visit: Payer: Self-pay | Admitting: Family Medicine

## 2022-07-06 DIAGNOSIS — E278 Other specified disorders of adrenal gland: Secondary | ICD-10-CM

## 2022-07-06 DIAGNOSIS — E1165 Type 2 diabetes mellitus with hyperglycemia: Secondary | ICD-10-CM

## 2022-07-06 DIAGNOSIS — E1159 Type 2 diabetes mellitus with other circulatory complications: Secondary | ICD-10-CM

## 2022-07-06 MED ORDER — TRULICITY 1.5 MG/0.5ML ~~LOC~~ SOAJ: 1.5000 mg | SUBCUTANEOUS | 0 refills | Status: DC

## 2022-07-06 MED ORDER — IOHEXOL 300 MG/ML  SOLN
100.0000 mL | Freq: Once | INTRAMUSCULAR | Status: AC | PRN
  Administered 2022-07-06: 100 mL via INTRAVENOUS

## 2022-08-17 ENCOUNTER — Other Ambulatory Visit: Payer: Self-pay | Admitting: Nurse Practitioner

## 2022-08-17 DIAGNOSIS — E1159 Type 2 diabetes mellitus with other circulatory complications: Secondary | ICD-10-CM

## 2022-08-20 ENCOUNTER — Other Ambulatory Visit: Payer: Self-pay | Admitting: Nurse Practitioner

## 2022-08-20 NOTE — Telephone Encounter (Signed)
Requested Prescriptions  Refused Prescriptions Disp Refills   hydrochlorothiazide (HYDRODIURIL) 25 MG tablet [Pharmacy Med Name: hydroCHLOROthiazide 25 MG Oral Tablet] 30 tablet 0    Sig: Take 1 tablet by mouth once daily     Cardiovascular: Diuretics - Thiazide Passed - 08/17/2022  6:44 PM      Passed - Cr in normal range and within 180 days    Creatinine, Ser  Date Value Ref Range Status  06/25/2022 0.80 0.57 - 1.00 mg/dL Final         Passed - K in normal range and within 180 days    Potassium  Date Value Ref Range Status  06/25/2022 3.7 3.5 - 5.2 mmol/L Final         Passed - Na in normal range and within 180 days    Sodium  Date Value Ref Range Status  06/25/2022 141 134 - 144 mmol/L Final         Passed - Last BP in normal range    BP Readings from Last 1 Encounters:  06/25/22 137/72         Passed - Valid encounter within last 6 months    Recent Outpatient Visits           1 month ago Type 2 diabetes mellitus with hyperglycemia, without long-term current use of insulin (HCC)   [place] [facility] Wallace, Megan P, DO   3 months ago Type 2 diabetes mellitus with hyperglycemia, without long-term current use of insulin (HCC)   [place] [facility] Larae Grooms, NP   5 months ago Annual physical exam   [place] [facility] Larae Grooms, NP   9 months ago Chronic migraine without aura without status migrainosus, not intractable   [place] [facility] Mecum, [name] E, PA-C   1 year ago Hyperlipidemia associated with type 2 diabetes mellitus (HCC)   [employer] [facility] Larae Grooms, NP       Future Appointments             In 1 week Laural Benes, Oralia Rud, DO [place] [facility], [facility]

## 2022-08-25 ENCOUNTER — Other Ambulatory Visit: Payer: Self-pay | Admitting: Nurse Practitioner

## 2022-08-25 DIAGNOSIS — E1169 Type 2 diabetes mellitus with other specified complication: Secondary | ICD-10-CM

## 2022-08-27 ENCOUNTER — Ambulatory Visit: Payer: 59 | Admitting: Family Medicine

## 2022-08-27 NOTE — Telephone Encounter (Signed)
Requested medication (s) are due for refill today: Yes  Requested medication (s) are on the active medication list: Yes  Last refill:  02/07/21  Future visit scheduled: No  Notes to clinic:  Prescription has expired.    Requested Prescriptions  Pending Prescriptions Disp Refills   pravastatin (PRAVACHOL) 40 MG tablet [Pharmacy Med Name: PRAVASTATIN SODIUM 40 MG TAB] 90 tablet 1    Sig: TAKE 1 TABLET BY MOUTH EVERY DAY     Cardiovascular:  Antilipid - Statins Failed - 08/25/2022  6:32 PM      Failed - Lipid Panel in normal range within the last 12 months    Cholesterol, Total  Date Value Ref Range Status  03/05/2022 246 (H) 100 - 199 mg/dL Final   LDL Chol Calc (NIH)  Date Value Ref Range Status  03/05/2022 155 (H) 0 - 99 mg/dL Final   HDL  Date Value Ref Range Status  03/05/2022 70 >39 mg/dL Final   Triglycerides  Date Value Ref Range Status  03/05/2022 118 0 - 149 mg/dL Final         Passed - Patient is not pregnant      Passed - Valid encounter within last 12 months    Recent Outpatient Visits           2 months ago Type 2 diabetes mellitus with hyperglycemia, without long-term current use of insulin (HCC)   [facility] Sutton, Megan P, DO   3 months ago Type 2 diabetes mellitus with hyperglycemia, without long-term current use of insulin (HCC)   [place] [facility] Larae Grooms, NP   5 months ago Annual physical exam   [place] [facility] Larae Grooms, NP   10 months ago Chronic migraine without aura without status migrainosus, not intractable   [place] [facility] Mecum, [name] E, PA-C   1 year ago Hyperlipidemia associated with type 2 diabetes mellitus [facility])    [facility] Larae Grooms, NP

## 2022-08-29 ENCOUNTER — Other Ambulatory Visit: Payer: Self-pay | Admitting: Nurse Practitioner

## 2022-08-29 DIAGNOSIS — E1159 Type 2 diabetes mellitus with other circulatory complications: Secondary | ICD-10-CM

## 2022-08-29 DIAGNOSIS — I152 Hypertension secondary to endocrine disorders: Secondary | ICD-10-CM

## 2022-08-30 NOTE — Telephone Encounter (Signed)
Refused HCTZ 25 mg refill request because it qwas discontinued 07/10/2021.

## 2022-09-04 ENCOUNTER — Ambulatory Visit: Payer: 59 | Admitting: Nurse Practitioner

## 2022-10-01 ENCOUNTER — Telehealth: Payer: Self-pay | Admitting: Nurse Practitioner

## 2022-10-01 NOTE — Telephone Encounter (Signed)
Pt is calling in because she would like to know if the office has any samples of Dulaglutide (TRULICITY) 1.5 MG/0.5ML SOPN [[id number]]  please follow up with pt.

## 2022-10-01 NOTE — Telephone Encounter (Signed)
Called patient to inform her that the office does not have samples

## 2022-10-05 ENCOUNTER — Telehealth: Payer: Self-pay | Admitting: Nurse Practitioner

## 2022-10-05 NOTE — Telephone Encounter (Signed)
Patient came into office asking for a prescription for Trulicity 1.5 mg to be sent to [facility] [address]. She is also asking for samples. Until she can pick up the prescription. Informed patient that I would route her message to the provider and to allow 48-72 hours for response. Patient request call at [phone].

## 2022-10-05 NOTE — Telephone Encounter (Signed)
Patient is overdue for follow up apt to recheck A1c and discuss medications. She will need apt

## 2022-10-08 ENCOUNTER — Other Ambulatory Visit: Payer: Self-pay | Admitting: Nurse Practitioner

## 2022-10-08 ENCOUNTER — Telehealth: Payer: Self-pay | Admitting: Nurse Practitioner

## 2022-10-08 ENCOUNTER — Ambulatory Visit: Payer: Self-pay | Admitting: *Deleted

## 2022-10-08 NOTE — Telephone Encounter (Signed)
No, I have not spoke to this patient.

## 2022-10-08 NOTE — Telephone Encounter (Signed)
Called patient she stated that she wanted to keep her appointment on 10/12/2022 and was asking about samples regarding Trulicity.  Sent a message to [place] to see if we have any and she stated that we didn't.  She stated that she will not have any for this week, I asked her when did she normally administer her shot and she stated on Wednesday.  I tried offering her appointment on 07/23 and 07/24 however she stated she could not afford the medication and would keep her appointment for Friday.

## 2022-10-08 NOTE — Telephone Encounter (Signed)
Created in error

## 2022-10-08 NOTE — Telephone Encounter (Signed)
Summary: headache   Pt requests that a nurse return her call because she has some concerns about a medication. Cb# [phone]  ----- Message from [facility] A sent at 10/08/2022 11:58 AM EDT ----- Pt states that she is out of medication and is having a headache. butalbital-acetaminophen-caffeine (FIORICET) [phone] [facility] tablet   [facility] [address], [place] - [address] [name] [address] Phone: [phone]  Fax: [phone]       Chief Complaint: medication requests Symptoms: headache without taking fioricet. Out of medication. Requesting samples of medication until new insurance available for trulicity . Patient reports If need to change medication to Ozempic to get samples, ok  until patient has insurance. That will cover trulicity.   Frequency: last Wednesday  Pertinent Negatives: Patient denies headache now, no dizziness no blurred vision Disposition: [] ED /[] Urgent Care (no appt availability in office) / [x] Appointment(In office/virtual)/ []  [facility]/ [] Home Care/ [] Refused Recommended Disposition /[] [place] [facility]/ []  Follow-up with PCP Additional Notes:   Appt scheduled for 10/10/22. Please advise if fioricet can be refilled.         Reason for Disposition  Prescription request for new medicine (not a refill)  Answer Assessment - Initial Assessment Questions 1. NAME of MEDICINE: "What medicine(s) are you calling about?"     Fioricet and trulicity  2. QUESTION: "What is your question?" (e.g., double dose of medicine, side effect)     Can fioricet be refilled and can samples be given or medication changed from trulicity to Ozempic if PCP suggests?  3. PRESCRIBER: "Who prescribed the medicine?" Reason: if prescribed by specialist, call should be referred to that group.     K. Caren Griffins, NP 4. SYMPTOMS: "Do you have any symptoms?" If Yes, ask: "What symptoms are you having?"  "How bad are the symptoms (e.g., mild,  moderate, severe)     Headache without fioricet.  Patient is in between insurances and requesting a medication that can be prescribed and given samples until insurance will pay  in place of trulicity 5. PREGNANCY:  "Is there any chance that you are pregnant?" "When was your last menstrual period?"     na  Protocols used: Medication Question Call-A-AH

## 2022-10-09 ENCOUNTER — Other Ambulatory Visit: Payer: Self-pay | Admitting: Physician Assistant

## 2022-10-09 NOTE — Telephone Encounter (Signed)
Requested medication (s) are due for refill today: yes  Requested medication (s) are on the active medication list: yes  Last refill:  08/21/22  Future visit scheduled: yes  Notes to clinic:  Unable to refill per protocol, cannot delegate.      Requested Prescriptions  Pending Prescriptions Disp Refills   butalbital-acetaminophen-caffeine (FIORICET) 50-325-40 MG tablet [Pharmacy Med Name: Butalbital-APAP-Caffeine 50-325-40 MG Oral Tablet] 14 tablet 0    Sig: TAKE 1 TABLET BY MOUTH EVERY 6 HOURS AS NEEDED FOR HEADACHE     Not Delegated - Analgesics:  Non-Opioid Analgesic Combinations 2 Failed - 10/08/2022 11:52 AM      Failed - This refill cannot be delegated      Passed - Cr in normal range and within 360 days    Creatinine, Ser  Date Value Ref Range Status  06/25/2022 0.80 0.57 - 1.00 mg/dL Final         Passed - eGFR is 10 or above and within 360 days    GFR calc Af Amer  Date Value Ref Range Status  04/22/2020 106 >59 mL/min/1.73 Final    Comment:    **In accordance with recommendations from the NKF-ASN Task force,**   [facility] is in the process of updating its eGFR calculation to the   2021 CKD-EPI creatinine equation that estimates kidney function   without a race variable.    GFR, Estimated  Date Value Ref Range Status  10/30/2021 >60 >60 mL/min Final    Comment:    (NOTE) Calculated using the CKD-EPI Creatinine Equation (2021)    eGFR  Date Value Ref Range Status  06/25/2022 83 >59 mL/min/1.73 Final         Passed - Patient is not pregnant      Passed - Valid encounter within last 12 months    Recent Outpatient Visits           3 months ago Type 2 diabetes mellitus with hyperglycemia, without long-term current use of insulin (HCC)   [place] [facility] [place], Megan P, DO   4 months ago Type 2 diabetes mellitus with hyperglycemia, without long-term current use of insulin (HCC)   [place] [facility] Larae Grooms,  NP   7 months ago Annual physical exam   [place] [facility] Larae Grooms, NP   11 months ago Chronic migraine without aura without status migrainosus, not intractable   [place] [facility] Mecum, [name] E, PA-C   1 year ago Hyperlipidemia associated with type 2 diabetes mellitus (HCC)   [place] [facility] Larae Grooms, NP       Future Appointments             Tomorrow Mecum, Oswaldo Conroy, PA-C [facility]

## 2022-10-10 ENCOUNTER — Ambulatory Visit (INDEPENDENT_AMBULATORY_CARE_PROVIDER_SITE_OTHER): Payer: Self-pay | Admitting: Physician Assistant

## 2022-10-10 ENCOUNTER — Encounter: Payer: Self-pay | Admitting: Physician Assistant

## 2022-10-10 VITALS — BP 123/74 | HR 89 | Ht 64.0 in | Wt 180.2 lb

## 2022-10-10 DIAGNOSIS — E1159 Type 2 diabetes mellitus with other circulatory complications: Secondary | ICD-10-CM

## 2022-10-10 DIAGNOSIS — E1165 Type 2 diabetes mellitus with hyperglycemia: Secondary | ICD-10-CM

## 2022-10-10 DIAGNOSIS — G43709 Chronic migraine without aura, not intractable, without status migrainosus: Secondary | ICD-10-CM

## 2022-10-10 DIAGNOSIS — I152 Hypertension secondary to endocrine disorders: Secondary | ICD-10-CM

## 2022-10-10 MED ORDER — RYBELSUS 3 MG PO TABS
3.0000 mg | ORAL_TABLET | Freq: Every day | ORAL | Status: DC
Start: 2022-10-10 — End: 2023-01-23

## 2022-10-10 MED ORDER — ALBUTEROL SULFATE HFA 108 (90 BASE) MCG/ACT IN AERS: 2.0000 | INHALATION_SPRAY | Freq: Four times a day (QID) | RESPIRATORY_TRACT | 1 refills | Status: DC | PRN

## 2022-10-10 MED ORDER — BUTALBITAL-APAP-CAFFEINE 50-325-40 MG PO TABS: ORAL_TABLET | ORAL | 0 refills | Status: DC

## 2022-10-10 NOTE — Progress Notes (Unsigned)
Acute Office Visit   Patient: Lacey Schaefer   DOB: [birth year]   62 y.o. Female  MRN: [id number] Visit Date: 10/10/2022  Today's healthcare provider: Oswaldo Conroy Brayn Eckstein, PA-C  Introduced myself to the patient as a Secondary school teacher and provided education on APPs in clinical practice.    Chief Complaint  Patient presents with   Diabetes   Hypertension   Medication Management    Patient says she would like to discuss Trulicity prescription as she has not been able to refill the prescription due to her insurance pending. Patient says back in the past, she had discuss with Clydie Braun about possibly being switched to Ozempic medication.    Subjective    HPI HPI     Medication Management    Additional comments: Patient says she would like to discuss Trulicity prescription as she has not been able to refill the prescription due to her insurance pending. Patient says back in the past, she had discuss with Clydie Braun about possibly being switched to Ozempic medication.       Last edited by Malen Gauze, CMA on 10/10/2022 10:20 AM.       Diabetes management Previous A1c: 10.4 in Feb 2024 was due for recheck in May   She reports she does not have insurance coverage at this time - she will not have coverage for a few more weeks - thinks it may get set up in Aug   She has been informed that the office does not have samples of Trulicity   She states her last Trulicity dose was last week  She states she needs refills for her migraine medication and inhaler   Patient states she cannot take Metformin due to GI upset but has tolerated Janumet in the past     Medications: Outpatient Medications Prior to Visit  Medication Sig   amLODipine (NORVASC) 10 MG tablet Take 1 tablet (10 mg total) by mouth daily.   aspirin 81 MG EC tablet Take by mouth daily.    carvedilol (COREG) 3.125 MG tablet Take 1 tablet (3.125 mg total) by mouth 2 (two) times daily with a meal.   glipiZIDE (GLUCOTROL) 5 MG tablet Take 1  tablet (5 mg total) by mouth 2 (two) times daily before a meal.   hydrALAZINE (APRESOLINE) 50 MG tablet Take 100 mg by mouth 2 (two) times daily.   hydrochlorothiazide (HYDRODIURIL) 12.5 MG tablet Take 12.5 mg by mouth daily.   potassium chloride SA (KLOR-CON M) 20 MEQ tablet Take 1 tablet by mouth once daily   pravastatin (PRAVACHOL) 40 MG tablet TAKE 1 TABLET BY MOUTH EVERY DAY   [DISCONTINUED] albuterol (VENTOLIN HFA) 108 (90 Base) MCG/ACT inhaler Inhale 2 puffs into the lungs every 6 (six) hours as needed for wheezing or shortness of breath.   [DISCONTINUED] butalbital-acetaminophen-caffeine (FIORICET) 50-325-40 MG tablet TAKE 1 TABLET BY MOUTH EVERY 6 HOURS AS NEEDED FOR HEADACHE   PATADAY 0.7 % SOLN Apply to eye. (Patient not taking: Reported on 06/25/2022)   [DISCONTINUED] Dulaglutide (TRULICITY) 1.5 MG/0.5ML SOPN Inject 1.5 mg into the skin once a week. (Patient not taking: Reported on 10/10/2022)   No facility-administered medications prior to visit.    Review of Systems  {Insert previous labs (optional):23779}  {See past labs  Heme  Chem  Endocrine  Serology  Results Review (optional):1}   Objective    BP 123/74   Pulse 89   Ht 5\' 4"  (1.626 m)   Wt 180  lb 3.2 oz (81.7 kg)   SpO2 98%   BMI 30.93 kg/m  {Insert last BP/Wt (optional):23777}  {See vitals history (optional):1}  Physical Exam Vitals reviewed.  Constitutional:      Appearance: Normal appearance.  HENT:     Head: Normocephalic and atraumatic.  Pulmonary:     Effort: Pulmonary effort is normal.     Breath sounds: Normal breath sounds.  Neurological:     Mental Status: She is alert.       No results found for any visits on 10/10/22.  Assessment & Plan      No follow-ups on file.     Problem List Items Addressed This Visit       Cardiovascular and Mediastinum   Hypertension associated with diabetes (HCC)   Relevant Medications   Semaglutide (RYBELSUS) 3 MG TABS   Chronic migraine without  aura without status migrainosus, not intractable - Primary   Relevant Medications   butalbital-acetaminophen-caffeine (FIORICET) 50-325-40 MG tablet     Endocrine   Type 2 diabetes mellitus with hyperglycemia (HCC)   Relevant Medications   Semaglutide (RYBELSUS) 3 MG TABS     No follow-ups on file.   I, Natividad Halls E Dene Nazir, PA-C, have reviewed all documentation for this visit. The documentation on 10/10/22 for the exam, diagnosis, procedures, and orders are all accurate and complete.   Jacquelin Hawking, MHS, PA-C [facility]

## 2022-10-11 NOTE — Assessment & Plan Note (Signed)
Chronic, historic condition Patient was previously started on Trulicity 1.5 mg weekly injection and states that she was tolerating this well however recent changes to her insurance have caused issues with her having adequate coverage. She is asked office if she can have samples of another medication or Trulicity until her insurance kicks back in about a month. Will provide samples of Rybelsus 3 mg p.o. daily tablets.  30-day supply was provided and samples today.  Patient was informed that the office cannot provide repeated samples as a way of maintaining her medications and if this will be required we might need to sign her up for patient assistance programs for her medications. Recommend that she follows up in about 4 weeks to discuss response and recheck A1c as she will hopefully have insurance coverage at this time and we can return to her regular regimen.

## 2022-10-11 NOTE — Assessment & Plan Note (Signed)
Chronic, historic condition Patient is currently taking carvedilol 3.125 mg p.o. twice daily, hydrochlorothiazide 12.5 mg p.o. daily, amlodipine 10 mg p.o. daily, hydralazine 100 mg p.o. twice daily. She reports that she is tolerating her current regimen well and denies needing refills at this time. Continue current regimen.  Recommend that she monitors blood pressures at home at least several times a week. Recommend follow-up in about 1 month for medication management and follow-up

## 2022-10-11 NOTE — Assessment & Plan Note (Signed)
Chronic, historic condition She reports that she needs a refill on her Fioricet 50-325-40 mg prescription as she has run out. She does not report recent migraine issues or concerns today.  Refill provided per request Follow-up as needed or in about 3 months for regular monitoring

## 2022-10-12 ENCOUNTER — Ambulatory Visit: Payer: Self-pay | Admitting: Family Medicine

## 2022-11-07 ENCOUNTER — Ambulatory Visit: Payer: Self-pay | Admitting: Physician Assistant

## 2022-11-09 ENCOUNTER — Telehealth: Payer: Self-pay | Admitting: Physician Assistant

## 2022-11-09 NOTE — Telephone Encounter (Signed)
Patient came into the office asking for samples of Rybelsus 3mg . Informed patient to allow 48-72 hours for provider to respond. I am forwarding to [facility], PA and CMA. Please follow up [phone]

## 2022-11-12 NOTE — Telephone Encounter (Signed)
No, she's not. I canceled it.

## 2022-11-12 NOTE — Telephone Encounter (Signed)
Called patient to inform her about starting the Patient Assistance Program and that the provider would discuss with her at her appt on the 8-28. Also informed patient to be prepared for labs. Patient stated she was going out of town and will call when she comes back to reschedule.

## 2022-11-13 ENCOUNTER — Ambulatory Visit: Payer: Self-pay | Admitting: Physician Assistant

## 2022-11-14 ENCOUNTER — Ambulatory Visit: Payer: Self-pay | Admitting: Physician Assistant

## 2022-12-08 ENCOUNTER — Other Ambulatory Visit: Payer: Self-pay | Admitting: Cardiovascular Disease

## 2022-12-08 DIAGNOSIS — I10 Essential (primary) hypertension: Secondary | ICD-10-CM

## 2022-12-08 MED ORDER — HYDRALAZINE HCL 50 MG PO TABS
50.0000 mg | ORAL_TABLET | Freq: Two times a day (BID) | ORAL | 2 refills | Status: AC
Start: 2022-12-08 — End: ?

## 2022-12-27 ENCOUNTER — Other Ambulatory Visit: Payer: Self-pay | Admitting: Nurse Practitioner

## 2022-12-27 NOTE — Telephone Encounter (Signed)
Requested Prescriptions  Pending Prescriptions Disp Refills   potassium chloride SA (KLOR-CON M) 20 MEQ tablet [Pharmacy Med Name: Potassium Chloride Crys ER 20 MEQ Oral Tablet Extended Release] 90 tablet 0    Sig: Take 1 tablet by mouth once daily     Endocrinology:  Minerals - Potassium Supplementation Passed - 12/27/2022 10:57 AM      Passed - K in normal range and within 360 days    Potassium  Date Value Ref Range Status  06/25/2022 3.7 3.5 - 5.2 mmol/L Final         Passed - Cr in normal range and within 360 days    Creatinine, Ser  Date Value Ref Range Status  06/25/2022 0.80 0.57 - 1.00 mg/dL Final         Passed - Valid encounter within last 12 months    Recent Outpatient Visits           2 months ago Chronic migraine without aura without status migrainosus, not intractable   [place] [facility] Mecum, [name] E, PA-C   6 months ago Type 2 diabetes mellitus with hyperglycemia, without long-term current use of insulin (HCC)   [place] [facility] [place], Megan P, DO   7 months ago Type 2 diabetes mellitus with hyperglycemia, without long-term current use of insulin [facility])   [facility] Larae Grooms, NP   9 months ago Annual physical exam   [place] [facility] Larae Grooms, NP   1 year ago Chronic migraine without aura without status migrainosus, not intractable   [place] [facility] Mecum, Oswaldo Conroy, PA-C

## 2023-01-03 NOTE — Progress Notes (Deleted)
There were no vitals taken for this visit.   Subjective:    Patient ID: Lacey Schaefer, female    DOB: 1960-07-03, 62 y.o.   MRN: [id number]  HPI: Lacey Schaefer is a 62 y.o. female  No chief complaint on file.  HYPERTENSION / HYPERLIPIDEMIA Satisfied with current treatment? yes Duration of hypertension: years BP monitoring frequency: daily BP range: 120-130/70-80 BP medication side effects: no Past BP meds:  hydralazine, amlodipine, carvedilol, and HCTZ Duration of hyperlipidemia: years Cholesterol medication side effects: no Cholesterol supplements: none Past cholesterol medications: pravastatin (pravachol) Medication compliance: excellent compliance Aspirin: yes Recent stressors: no Recurrent headaches: no Visual changes: no Palpitations: no Dyspnea: no Chest pain: no Lower extremity edema: no Dizzy/lightheaded: no  DIABETES Hypoglycemic episodes:no Polydipsia/polyuria: no Visual disturbance: no Chest pain: no Paresthesias: no Glucose Monitoring: no  Accucheck frequency: TID  Fasting glucose:  Post prandial:  Evening:  Before meals: Taking Insulin?: no  Long acting insulin:  Short acting insulin: Blood Pressure Monitoring: daily Retinal Examination: Not up to Date Foot Exam: Up to Date Diabetic Education: Not Completed Pneumovax: Not up to Date Influenza: Not up to Date Aspirin: no Patient states she was able to go back on the Janumet because she got new insurance. She has been getting it from Endocrinology in [place].  SHOULDER PAIN Duration: months Involved shoulder: right Mechanism of injury: unknown Location: diffuse Onset:gradual Severity: 6/10  Quality:  aching Frequency: constant Patient did not complete physical therapy.  She is wondering about a cortisone infection.  Relevant past medical, surgical, family and social history reviewed and updated as indicated. Interim medical history since our last visit reviewed. Allergies and  medications reviewed and updated.  Review of Systems  Eyes:  Negative for visual disturbance.  Respiratory:  Negative for cough, chest tightness and shortness of breath.   Cardiovascular:  Negative for chest pain, palpitations and leg swelling.  Endocrine: Negative for polydipsia and polyuria.  Musculoskeletal:        Right shoulder pain  Neurological:  Negative for dizziness, numbness and headaches.   Per HPI unless specifically indicated above     Objective:    There were no vitals taken for this visit.  Wt Readings from Last 3 Encounters:  10/10/22 180 lb 3.2 oz (81.7 kg)  06/25/22 173 lb 11.2 oz (78.8 kg)  05/16/22 182 lb 3.2 oz (82.6 kg)    Physical Exam Vitals and nursing note reviewed.  Constitutional:      General: She is not in acute distress.    Appearance: Normal appearance. She is normal weight. She is not ill-appearing, toxic-appearing or diaphoretic.  HENT:     Head: Normocephalic.     Right Ear: External ear normal.     Left Ear: External ear normal.     Nose: Nose normal.     Mouth/Throat:     Mouth: Mucous membranes are moist.     Pharynx: Oropharynx is clear.  Eyes:     General:        Right eye: No discharge.        Left eye: No discharge.     Extraocular Movements: Extraocular movements intact.     Conjunctiva/sclera: Conjunctivae normal.     Pupils: Pupils are equal, round, and reactive to light.  Cardiovascular:     Rate and Rhythm: Normal rate and regular rhythm.     Heart sounds: No murmur heard. Pulmonary:     Effort: Pulmonary effort is normal. No respiratory distress.  Breath sounds: Normal breath sounds. No wheezing or rales.  Musculoskeletal:        General: No tenderness. Normal range of motion.     Cervical back: Normal range of motion and neck supple.     Right lower leg: No edema.     Left lower leg: No edema.  Skin:    General: Skin is warm and dry.     Capillary Refill: Capillary refill takes less than 2 seconds.   Neurological:     General: No focal deficit present.     Mental Status: She is alert and oriented to person, place, and time. Mental status is at baseline.  Psychiatric:        Mood and Affect: Mood normal.        Behavior: Behavior normal.        Thought Content: Thought content normal.        Judgment: Judgment normal.    Results for orders placed or performed in visit on 06/25/22  Basic metabolic panel  Result Value Ref Range   Glucose 384 (H) 70 - 99 mg/dL   BUN 16 8 - 27 mg/dL   Creatinine, Ser 1.61 0.57 - 1.00 mg/dL   eGFR 83 >09 UE/AVW/0.98   BUN/Creatinine Ratio 20 12 - 28   Sodium 141 134 - 144 mmol/L   Potassium 3.7 3.5 - 5.2 mmol/L   Chloride 100 96 - 106 mmol/L   CO2 26 20 - 29 mmol/L   Calcium 9.6 8.7 - 10.3 mg/dL      Assessment & Plan:   Problem List Items Addressed This Visit       Cardiovascular and Mediastinum   Hypertension associated with diabetes (HCC) - Primary     Endocrine   Type 2 diabetes mellitus with hyperglycemia (HCC)   Hyperlipidemia associated with type 2 diabetes mellitus (HCC)     Other   Obesity, Class I, BMI 30-34.9     Follow up plan: No follow-ups on file.

## 2023-01-04 ENCOUNTER — Ambulatory Visit: Payer: 59 | Admitting: Nurse Practitioner

## 2023-01-12 ENCOUNTER — Other Ambulatory Visit: Payer: Self-pay | Admitting: Family Medicine

## 2023-01-14 NOTE — Telephone Encounter (Signed)
Requested Prescriptions  Refused Prescriptions Disp Refills   TRULICITY 1.5 MG/0.5ML SOAJ [Pharmacy Med Name: Trulicity 1.5 MG/0.5ML Subcutaneous Solution Pen-injector] 12 mL 0    Sig: INJECT 1 DOSE SUBCUTANEOUSLY ONCE A WEEK     Endocrinology:  Diabetes - GLP-1 Receptor Agonists Failed - 01/12/2023  6:53 PM      Failed - HBA1C is between 0 and 7.9 and within 180 days    HB A1C (BAYER DCA - WAIVED)  Date Value Ref Range Status  04/22/2020 7.6 (H) <7.0 % Final    Comment:                                          Diabetic Adult            <7.0                                       Healthy Adult        4.3 - 5.7                                                           (DCCT/NGSP) American Diabetes Association's Summary of Glycemic Recommendations for Adults with Diabetes: Hemoglobin A1c <7.0%. More stringent glycemic goals (A1c <6.0%) may further reduce complications at the cost of increased risk of hypoglycemia.    Hgb A1c MFr Bld  Date Value Ref Range Status  05/16/2022 10.4 (H) 4.8 - 5.6 % Final    Comment:             Prediabetes: 5.7 - 6.4          Diabetes: >6.4          Glycemic control for adults with diabetes: <7.0          Passed - Valid encounter within last 6 months    Recent Outpatient Visits           3 months ago Chronic migraine without aura without status migrainosus, not intractable   [place] [facility] Mecum, [name] E, PA-C   6 months ago Type 2 diabetes mellitus with hyperglycemia, without long-term current use of insulin (HCC)   [place] [facility] [place], Megan P, DO   8 months ago Type 2 diabetes mellitus with hyperglycemia, without long-term current use of insulin [facility])   [place] [facility] Larae Grooms, NP   10 months ago Annual physical exam   [place] [facility] Larae Grooms, NP   1 year ago Chronic migraine without aura without status migrainosus, not intractable   [facility] Mecum, Oswaldo Conroy, PA-C       Future Appointments             In 1 week Larae Grooms, NP [place] [facility]

## 2023-01-22 ENCOUNTER — Other Ambulatory Visit: Payer: Self-pay

## 2023-01-22 ENCOUNTER — Other Ambulatory Visit: Payer: Self-pay | Admitting: Family Medicine

## 2023-01-22 DIAGNOSIS — E1159 Type 2 diabetes mellitus with other circulatory complications: Secondary | ICD-10-CM

## 2023-01-22 NOTE — Progress Notes (Unsigned)
There were no vitals taken for this visit.   Subjective:    Patient ID: Lacey Schaefer, female    DOB: 09-Dec-1960, 62 y.o.   MRN: [id number]  HPI: Lacey Schaefer is a 62 y.o. female  No chief complaint on file.  HYPERTENSION / HYPERLIPIDEMIA Satisfied with current treatment? yes Duration of hypertension: years BP monitoring frequency: daily BP range: 120-130/70-80 BP medication side effects: no Past BP meds:  hydralazine, amlodipine, carvedilol, and HCTZ Duration of hyperlipidemia: years Cholesterol medication side effects: no Cholesterol supplements: none Past cholesterol medications: pravastatin (pravachol) Medication compliance: excellent compliance Aspirin: yes Recent stressors: no Recurrent headaches: no Visual changes: no Palpitations: no Dyspnea: no Chest pain: no Lower extremity edema: no Dizzy/lightheaded: no  DIABETES Hypoglycemic episodes:no Polydipsia/polyuria: no Visual disturbance: no Chest pain: no Paresthesias: no Glucose Monitoring: no  Accucheck frequency: TID  Fasting glucose:  Post prandial:  Evening:  Before meals: Taking Insulin?: no  Long acting insulin:  Short acting insulin: Blood Pressure Monitoring: daily Retinal Examination: Not up to Date Foot Exam: Up to Date Diabetic Education: Not Completed Pneumovax: Not up to Date Influenza: Not up to Date Aspirin: no Patient states she was able to go back on the Janumet because she got new insurance. She has been getting it from Endocrinology in [place].  SHOULDER PAIN Duration: months Involved shoulder: right Mechanism of injury: unknown Location: diffuse Onset:gradual Severity: 6/10  Quality:  aching Frequency: constant Patient did not complete physical therapy.  She is wondering about a cortisone infection.  Relevant past medical, surgical, family and social history reviewed and updated as indicated. Interim medical history since our last visit reviewed. Allergies and  medications reviewed and updated.  Review of Systems  Eyes:  Negative for visual disturbance.  Respiratory:  Negative for cough, chest tightness and shortness of breath.   Cardiovascular:  Negative for chest pain, palpitations and leg swelling.  Endocrine: Negative for polydipsia and polyuria.  Musculoskeletal:        Right shoulder pain  Neurological:  Negative for dizziness, numbness and headaches.   Per HPI unless specifically indicated above     Objective:    There were no vitals taken for this visit.  Wt Readings from Last 3 Encounters:  10/10/22 180 lb 3.2 oz (81.7 kg)  06/25/22 173 lb 11.2 oz (78.8 kg)  05/16/22 182 lb 3.2 oz (82.6 kg)    Physical Exam Vitals and nursing note reviewed.  Constitutional:      General: She is not in acute distress.    Appearance: Normal appearance. She is normal weight. She is not ill-appearing, toxic-appearing or diaphoretic.  HENT:     Head: Normocephalic.     Right Ear: External ear normal.     Left Ear: External ear normal.     Nose: Nose normal.     Mouth/Throat:     Mouth: Mucous membranes are moist.     Pharynx: Oropharynx is clear.  Eyes:     General:        Right eye: No discharge.        Left eye: No discharge.     Extraocular Movements: Extraocular movements intact.     Conjunctiva/sclera: Conjunctivae normal.     Pupils: Pupils are equal, round, and reactive to light.  Cardiovascular:     Rate and Rhythm: Normal rate and regular rhythm.     Heart sounds: No murmur heard. Pulmonary:     Effort: Pulmonary effort is normal. No respiratory distress.  Breath sounds: Normal breath sounds. No wheezing or rales.  Musculoskeletal:        General: No tenderness. Normal range of motion.     Cervical back: Normal range of motion and neck supple.     Right lower leg: No edema.     Left lower leg: No edema.  Skin:    General: Skin is warm and dry.     Capillary Refill: Capillary refill takes less than 2 seconds.   Neurological:     General: No focal deficit present.     Mental Status: She is alert and oriented to person, place, and time. Mental status is at baseline.  Psychiatric:        Mood and Affect: Mood normal.        Behavior: Behavior normal.        Thought Content: Thought content normal.        Judgment: Judgment normal.    Results for orders placed or performed in visit on 06/25/22  Basic metabolic panel  Result Value Ref Range   Glucose 384 (H) 70 - 99 mg/dL   BUN 16 8 - 27 mg/dL   Creatinine, Ser 8.29 0.57 - 1.00 mg/dL   eGFR 83 >56 OZ/HYQ/6.57   BUN/Creatinine Ratio 20 12 - 28   Sodium 141 134 - 144 mmol/L   Potassium 3.7 3.5 - 5.2 mmol/L   Chloride 100 96 - 106 mmol/L   CO2 26 20 - 29 mmol/L   Calcium 9.6 8.7 - 10.3 mg/dL      Assessment & Plan:   Problem List Items Addressed This Visit       Cardiovascular and Mediastinum   Hypertension associated with diabetes (HCC)     Endocrine   Type 2 diabetes mellitus with hyperglycemia (HCC) - Primary   Hyperlipidemia associated with type 2 diabetes mellitus (HCC)     Other   Obesity, Class I, BMI 30-34.9     Follow up plan: No follow-ups on file.

## 2023-01-22 NOTE — Telephone Encounter (Signed)
Patient pharmacy is requesting refills for Amlodipine 10 mg please advise

## 2023-01-23 ENCOUNTER — Ambulatory Visit (INDEPENDENT_AMBULATORY_CARE_PROVIDER_SITE_OTHER): Payer: 59 | Admitting: Nurse Practitioner

## 2023-01-23 ENCOUNTER — Encounter: Payer: Self-pay | Admitting: Nurse Practitioner

## 2023-01-23 VITALS — BP 125/68 | HR 85 | Temp 98.6°F | Ht 64.0 in | Wt 184.6 lb

## 2023-01-23 DIAGNOSIS — Z23 Encounter for immunization: Secondary | ICD-10-CM | POA: Diagnosis not present

## 2023-01-23 DIAGNOSIS — E785 Hyperlipidemia, unspecified: Secondary | ICD-10-CM | POA: Diagnosis not present

## 2023-01-23 DIAGNOSIS — E1165 Type 2 diabetes mellitus with hyperglycemia: Secondary | ICD-10-CM

## 2023-01-23 DIAGNOSIS — Z7984 Long term (current) use of oral hypoglycemic drugs: Secondary | ICD-10-CM | POA: Diagnosis not present

## 2023-01-23 DIAGNOSIS — E1159 Type 2 diabetes mellitus with other circulatory complications: Secondary | ICD-10-CM

## 2023-01-23 DIAGNOSIS — E66811 Obesity, class 1: Secondary | ICD-10-CM | POA: Diagnosis not present

## 2023-01-23 DIAGNOSIS — E1169 Type 2 diabetes mellitus with other specified complication: Secondary | ICD-10-CM | POA: Diagnosis not present

## 2023-01-23 DIAGNOSIS — I152 Hypertension secondary to endocrine disorders: Secondary | ICD-10-CM

## 2023-01-23 MED ORDER — AMLODIPINE BESYLATE 10 MG PO TABS: 10.0000 mg | ORAL_TABLET | Freq: Every day | ORAL | 1 refills | Status: AC

## 2023-01-23 NOTE — Assessment & Plan Note (Signed)
Chronic. Not well controlled.  Last A1c was 9.7%.  This is down from 10.4%.  Currently taking Metformin, Glipizide and Trulicity.  No labs ordered at visit today.  Patient is now followed by Endocrinology.  Continue to collaborate with specialist.  Follow up in 6 months.  Call sooner if concerns arise.

## 2023-01-23 NOTE — Assessment & Plan Note (Signed)
Chronic.  Controlled.  Continue with current medication regimen of amlodipine, HCTZ, hydralazine and carvedilol.  No labs today- will get at Endo. Return to clinic in 6 months for reevaluation.  Call sooner if concerns arise.

## 2023-01-23 NOTE — Assessment & Plan Note (Signed)
Recommended eating smaller high protein, low fat meals more frequently and exercising 30 mins a day 5 times a week with a goal of 10-15lb weight loss in the next 3 months.  

## 2023-01-23 NOTE — Assessment & Plan Note (Addendum)
Chronic.  Controlled.  Continue with current medication regimen of Pravastatin daily.  No labs today- patient will get them at Endo.  Return to clinic in 6 months for reevaluation.  Call sooner if concerns arise.

## 2023-01-23 NOTE — Telephone Encounter (Signed)
Refused Norvasc 1 mg because it was signed and sent during this morning's appt.

## 2023-02-05 DIAGNOSIS — R079 Chest pain, unspecified: Secondary | ICD-10-CM | POA: Diagnosis not present

## 2023-02-05 DIAGNOSIS — R002 Palpitations: Secondary | ICD-10-CM | POA: Diagnosis not present

## 2023-02-05 DIAGNOSIS — I10 Essential (primary) hypertension: Secondary | ICD-10-CM | POA: Diagnosis not present

## 2023-02-05 DIAGNOSIS — E66811 Obesity, class 1: Secondary | ICD-10-CM | POA: Diagnosis not present

## 2023-02-05 DIAGNOSIS — E782 Mixed hyperlipidemia: Secondary | ICD-10-CM | POA: Diagnosis not present

## 2023-02-05 DIAGNOSIS — E119 Type 2 diabetes mellitus without complications: Secondary | ICD-10-CM | POA: Diagnosis not present

## 2023-02-05 DIAGNOSIS — K219 Gastro-esophageal reflux disease without esophagitis: Secondary | ICD-10-CM | POA: Diagnosis not present

## 2023-02-18 DIAGNOSIS — R079 Chest pain, unspecified: Secondary | ICD-10-CM | POA: Diagnosis not present

## 2023-02-20 ENCOUNTER — Other Ambulatory Visit: Payer: Self-pay | Admitting: Physician Assistant

## 2023-02-20 DIAGNOSIS — R002 Palpitations: Secondary | ICD-10-CM | POA: Diagnosis not present

## 2023-02-21 ENCOUNTER — Ambulatory Visit: Payer: Self-pay

## 2023-02-21 ENCOUNTER — Telehealth: Payer: Self-pay | Admitting: Nurse Practitioner

## 2023-02-21 NOTE — Telephone Encounter (Signed)
A user error has taken place: encounter opened in error, closed for administrative reasons.

## 2023-02-21 NOTE — Telephone Encounter (Signed)
  Chief Complaint: dizziness Symptoms: dizziness on and off, worse at night when getting in bed and feels like bed is spinning. Dizziness last night was bad where pt almost fell Frequency: 2 weeks Pertinent Negatives: NA Disposition: [] ED /[] Urgent Care (no appt availability in office) / [] Appointment(In office/virtual)/ []  [facility] [facility]/ [] Home Care/ [x] Refused Recommended Disposition /[] [facility]/ [x]  Follow-up with PCP Additional Notes: pt states dizziness was bad last night where she had to hold on to wall and almost fell but didn't. Pt is requesting medication to be sent in. Says she just seen cardiology couple of weeks ago and mentioned this but didn't do anything. Pt states she has had vertigo in the past and had medication sent in but been a while. Pt didn't want to come in for OV even tho appts were available. Pt would rather see if rx could be sent in. Advised I would send message to provider for review.   Summary: Dizziness   Pt is expressing dizziness. She stated that she has experienced this before but she fell yesterday as a result of it.     Reason for Disposition  [1] MODERATE dizziness (e.g., interferes with normal activities) AND [2] has been evaluated by doctor (or NP/PA) for this  Answer Assessment - Initial Assessment Questions 2. LIGHTHEADED: "Do you feel lightheaded?" (e.g., somewhat faint, woozy, weak upon standing)     Feeling woozy  3. VERTIGO: "Do you feel like either you or the room is spinning or tilting?" (i.e. vertigo)     yes 4. SEVERITY: "How bad is it?"  "Do you feel like you are going to faint?" "Can you stand and walk?"   - MILD: Feels slightly dizzy, but walking normally.   - MODERATE: Feels unsteady when walking, but not falling; interferes with normal activities (e.g., school, work).   - SEVERE: Unable to walk without falling, or requires assistance to walk without falling; feels like passing out now.      Moderate  5. ONSET:   "When did the dizziness begin?"     2 weeks on and off 6. AGGRAVATING FACTORS: "Does anything make it worse?" (e.g., standing, change in head position)     Night time getting in bed 10. OTHER SYMPTOMS: "Do you have any other symptoms?" (e.g., fever, chest pain, vomiting, diarrhea, bleeding)  Protocols used: Dizziness - Lightheadedness-A-AH

## 2023-02-22 ENCOUNTER — Encounter: Payer: Self-pay | Admitting: Family Medicine

## 2023-02-22 ENCOUNTER — Ambulatory Visit: Payer: 59 | Admitting: Family Medicine

## 2023-02-22 VITALS — BP 149/76 | HR 92 | Ht 64.0 in | Wt 185.2 lb

## 2023-02-22 DIAGNOSIS — H8113 Benign paroxysmal vertigo, bilateral: Secondary | ICD-10-CM

## 2023-02-22 DIAGNOSIS — Z23 Encounter for immunization: Secondary | ICD-10-CM | POA: Diagnosis not present

## 2023-02-22 MED ORDER — MECLIZINE HCL 25 MG PO TABS: 25.0000 mg | ORAL_TABLET | Freq: Three times a day (TID) | ORAL | 0 refills | Status: DC | PRN

## 2023-02-22 NOTE — Telephone Encounter (Signed)
Patient has an appt this morning.

## 2023-02-22 NOTE — Telephone Encounter (Signed)
Requested Prescriptions  Pending Prescriptions Disp Refills   albuterol (VENTOLIN HFA) 108 (90 Base) MCG/ACT inhaler [Pharmacy Med Name: ALBUTEROL HFA (VENTOLIN) INH] 18 each 1    Sig: INHALE 2 PUFFS BY MOUTH EVERY 6 HOURS AS NEEDED FOR WHEEZING OR FOR SHORTNESS OF BREATH     Pulmonology:  Beta Agonists 2 Passed - 02/20/2023  6:25 PM      Passed - Last BP in normal range    BP Readings from Last 1 Encounters:  02/22/23 (!) 149/76         Passed - Last Heart Rate in normal range    Pulse Readings from Last 1 Encounters:  02/22/23 92         Passed - Valid encounter within last 12 months    Recent Outpatient Visits           Today Needs flu shot   [facility] [place], Megan P, DO   1 month ago Type 2 diabetes mellitus with hyperglycemia, without long-term current use of insulin (HCC)   [place] [facility] Larae Grooms, NP   4 months ago Chronic migraine without aura without status migrainosus, not intractable   [place] [facility] Mecum, [name] E, PA-C   8 months ago Type 2 diabetes mellitus with hyperglycemia, without long-term current use of insulin (HCC)   [place] [facility] [place], Megan P, DO   9 months ago Type 2 diabetes mellitus with hyperglycemia, without long-term current use of insulin [facility])   [place] [facility] Larae Grooms, NP       Future Appointments             In 5 months Larae Grooms, NP  [facility]

## 2023-02-22 NOTE — Progress Notes (Signed)
BP (!) 149/76   Pulse 92   Ht 5\' 4"  (1.626 m)   Wt 185 lb 3.2 oz (84 kg)   SpO2 98%   BMI 31.79 kg/m    Subjective:    Patient ID: Lacey Schaefer, female    DOB: 16-Nov-1960, 62 y.o.   MRN: [id number]  HPI: Lacey Schaefer is a 62 y.o. female  Chief Complaint  Patient presents with   Dizziness    Patient says she has been having episodes of dizziness off and on for the past week or so. Patient says night before last, she was laying down in her bed and she says the room was spinning. Patient says she has to wait until the episode passes before she can sit. Patient says she has had this episodes in the past and when she stands, she has had to hold onto the wall. Patient says she mentioned it to her Cardiologist and was informed that she may have episode of Vertigo.    DIZZINESS Duration: about 7-10 days Description of symptoms: room spinning Duration of episode: minutes Dizziness frequency: recurrent Provoking factors: laying down in bed Triggered by rolling over in bed: yes Triggered by bending over: yes Aggravated by head movement: no Aggravated by exertion, coughing, loud noises: no Recent head injury: no Recent or current viral symptoms: no History of vasovagal episodes: no Nausea: yes Vomiting: no Tinnitus: no Hearing loss: no Aural fullness: no Headache: yes Photophobia/phonophobia: no Unsteady gait: no Postural instability: no Diplopia, dysarthria, dysphagia or weakness: no Related to exertion: no Pallor: no Diaphoresis: no Dyspnea: yes, occasionally Chest pain: no  Relevant past medical, surgical, family and social history reviewed and updated as indicated. Interim medical history since our last visit reviewed. Allergies and medications reviewed and updated.  Review of Systems  Constitutional: Negative.   Respiratory: Negative.    Cardiovascular: Negative.   Musculoskeletal: Negative.   Neurological:  Positive for dizziness. Negative for tremors,  seizures, syncope, facial asymmetry, speech difficulty, weakness, light-headedness, numbness and headaches.  Psychiatric/Behavioral: Negative.      Per HPI unless specifically indicated above     Objective:    BP (!) 149/76   Pulse 92   Ht 5\' 4"  (1.626 m)   Wt 185 lb 3.2 oz (84 kg)   SpO2 98%   BMI 31.79 kg/m   Wt Readings from Last 3 Encounters:  02/22/23 185 lb 3.2 oz (84 kg)  01/23/23 184 lb 9.6 oz (83.7 kg)  10/10/22 180 lb 3.2 oz (81.7 kg)    Physical Exam Vitals and nursing note reviewed.  Constitutional:      General: She is not in acute distress.    Appearance: Normal appearance. She is not ill-appearing, toxic-appearing or diaphoretic.  HENT:     Head: Normocephalic and atraumatic.     Right Ear: Tympanic membrane, ear canal and external ear normal. There is no impacted cerumen.     Left Ear: Tympanic membrane, ear canal and external ear normal. There is no impacted cerumen.     Nose: Congestion present. No rhinorrhea.     Mouth/Throat:     Mouth: Mucous membranes are moist.     Pharynx: Oropharynx is clear. No oropharyngeal exudate or posterior oropharyngeal erythema.  Eyes:     General: No scleral icterus.       Right eye: No discharge.        Left eye: No discharge.     Extraocular Movements: Extraocular movements intact.  Right eye: Nystagmus present.     Left eye: Nystagmus present.     Conjunctiva/sclera: Conjunctivae normal.     Pupils: Pupils are equal, round, and reactive to light.  Cardiovascular:     Rate and Rhythm: Normal rate and regular rhythm.     Pulses: Normal pulses.     Heart sounds: Normal heart sounds. No murmur heard.    No friction rub. No gallop.  Pulmonary:     Effort: Pulmonary effort is normal. No respiratory distress.     Breath sounds: Normal breath sounds. No stridor. No wheezing, rhonchi or rales.  Chest:     Chest wall: No tenderness.  Musculoskeletal:        General: Normal range of motion.     Cervical back: Normal  range of motion and neck supple.  Skin:    General: Skin is warm and dry.     Capillary Refill: Capillary refill takes less than 2 seconds.     Coloration: Skin is not jaundiced or pale.     Findings: No bruising, erythema, lesion or rash.  Neurological:     General: No focal deficit present.     Mental Status: She is alert and oriented to person, place, and time. Mental status is at baseline.  Psychiatric:        Mood and Affect: Mood normal.        Behavior: Behavior normal.        Thought Content: Thought content normal.        Judgment: Judgment normal.     Results for orders placed or performed in visit on 06/25/22  Basic metabolic panel  Result Value Ref Range   Glucose 384 (H) 70 - 99 mg/dL   BUN 16 8 - 27 mg/dL   Creatinine, Ser 1.91 0.57 - 1.00 mg/dL   eGFR 83 >47 WG/NFA/2.13   BUN/Creatinine Ratio 20 12 - 28   Sodium 141 134 - 144 mmol/L   Potassium 3.7 3.5 - 5.2 mmol/L   Chloride 100 96 - 106 mmol/L   CO2 26 20 - 29 mmol/L   Calcium 9.6 8.7 - 10.3 mg/dL      Assessment & Plan:   Problem List Items Addressed This Visit   None Visit Diagnoses     Benign paroxysmal positional vertigo due to bilateral vestibular disorder    -  Primary   Will treat with meclizine and epley's manuver. If not getting better by monday will refer to vestibular rehab.   Needs flu shot       Flu shot given today.   Relevant Orders   Flu Vaccine Trivalent High Dose (Fluad)   Need for COVID-19 vaccine       COVID shot given today.   Relevant Orders   Pfizer Comirnaty Covid -19 Vaccine 17yrs and older        Follow up plan: Return for As scheduled.

## 2023-02-28 DIAGNOSIS — R1032 Left lower quadrant pain: Secondary | ICD-10-CM | POA: Diagnosis not present

## 2023-02-28 DIAGNOSIS — E1165 Type 2 diabetes mellitus with hyperglycemia: Secondary | ICD-10-CM | POA: Diagnosis not present

## 2023-03-08 ENCOUNTER — Telehealth: Payer: 59 | Admitting: Family Medicine

## 2023-03-08 ENCOUNTER — Ambulatory Visit: Payer: Self-pay | Admitting: *Deleted

## 2023-03-08 ENCOUNTER — Telehealth: Payer: Self-pay | Admitting: Nurse Practitioner

## 2023-03-08 DIAGNOSIS — J4531 Mild persistent asthma with (acute) exacerbation: Secondary | ICD-10-CM | POA: Diagnosis not present

## 2023-03-08 MED ORDER — ALBUTEROL SULFATE HFA 108 (90 BASE) MCG/ACT IN AERS: 2.0000 | INHALATION_SPRAY | Freq: Four times a day (QID) | RESPIRATORY_TRACT | 0 refills | Status: DC | PRN

## 2023-03-08 MED ORDER — PREDNISONE 20 MG PO TABS: 20.0000 mg | ORAL_TABLET | Freq: Two times a day (BID) | ORAL | 0 refills | Status: AC

## 2023-03-08 NOTE — Telephone Encounter (Signed)
  Chief Complaint: Pt c/o coughing a lot especially at night.  Only clear mucus.    Symptoms: Mostly non productive cough especially at night.   Refusing at appt.   "I just need Clydie Braun to call me in some prednisone".   I let her know I would send Larae Grooms, NP a note to see if she would be willing to call in prednisone without seeing the pt first. Frequency: It's just an aggravating cough Pertinent Negatives: Patient denies fever or other symptoms Disposition: [] ED /[] Urgent Care (no appt availability in office) / [] Appointment(In office/virtual)/ []  [place] [facility]/ [] Home Care/ [] Refused Recommended Disposition /[] [place] [facility]/ [x]  Follow-up with PCP Additional Notes: Refusing an appt.   Message sent to Larae Grooms, NP with pt's request for prednisone.     Pt asking that someone call her back with a response.

## 2023-03-08 NOTE — Telephone Encounter (Signed)
This encounter was created in error - please disregard.

## 2023-03-08 NOTE — Progress Notes (Signed)
E-Visit for Cough   We are sorry that you are not feeling well.  Here is how we plan to help!  Based on your presentation I believe you most likely have A cough due to allergies.  I recommend that you start the an over-the counter-allergy medication such as Claritin 10 mg or Zyrtec 10 mg daily.     I am sending prednisone and albuterol as requested. If sx worsen please go to UC.   From your responses in the eVisit questionnaire you describe inflammation in the upper respiratory tract which is causing a significant cough.  This is commonly called Bronchitis and has four common causes:   Allergies Viral Infections Acid Reflux Bacterial Infection Allergies, viruses and acid reflux are treated by controlling symptoms or eliminating the cause. An example might be a cough caused by taking certain blood pressure medications. You stop the cough by changing the medication. Another example might be a cough caused by acid reflux. Controlling the reflux helps control the cough.  USE OF BRONCHODILATOR ("RESCUE") INHALERS: There is a risk from using your bronchodilator too frequently.  The risk is that over-reliance on a medication which only relaxes the muscles surrounding the breathing tubes can reduce the effectiveness of medications prescribed to reduce swelling and congestion of the tubes themselves.  Although you feel brief relief from the bronchodilator inhaler, your asthma may actually be worsening with the tubes becoming more swollen and filled with mucus.  This can delay other crucial treatments, such as oral steroid medications. If you need to use a bronchodilator inhaler daily, several times per day, you should discuss this with your provider.  There are probably better treatments that could be used to keep your asthma under control.     HOME CARE Only take medications as instructed by your medical team. Complete the entire course of an antibiotic. Drink plenty of fluids and get plenty of  rest. Avoid close contacts especially the very young and the elderly Cover your mouth if you cough or cough into your sleeve. Always remember to wash your hands A steam or ultrasonic humidifier can help congestion.   GET HELP RIGHT AWAY IF: You develop worsening fever. You become short of breath You cough up blood. Your symptoms persist after you have completed your treatment plan MAKE SURE YOU  Understand these instructions. Will watch your condition. Will get help right away if you are not doing well or get worse.    Thank you for choosing an e-visit.  Your e-visit answers were reviewed by a board certified advanced clinical practitioner to complete your personal care plan. Depending upon the condition, your plan could have included both over the counter or prescription medications.  Please review your pharmacy choice. Make sure the pharmacy is open so you can pick up prescription now. If there is a problem, you may contact your provider through [employer] and have the prescription routed to another pharmacy.  Your safety is important to [place]. If you have drug allergies check your prescription carefully.   For the next 24 hours you can use MyChart to ask questions about today's visit, request a non-urgent call back, or ask for a work or school excuse. You will get an email in the next two days asking about your experience. I hope that your e-visit has been valuable and will speed your recovery. \   have provided 5 minutes of non face to face time during this encounter for chart review and documentation.

## 2023-03-08 NOTE — Telephone Encounter (Signed)
Message from Pleasanton L sent at 03/08/2023  1:55 PM EST  Summary: Rx requested - persistent cough   Pt requesting prednisone. Pt has a persistent cough starting with tickle in throat. Pt had prednisone in the past and that has helped before. The coughing is waking pt up at night. Cough started about 2 days ago.  Pt seeking clinical advice          Call History  Contact Date/Time Type Contact Phone/Fax By  03/08/2023 01:51 PM EST Phone (Incoming) Lindsi, Rudel (Self) [phone] Judie Petit) Salley Hews, Dannielle Burn   Reason for Disposition  [1] Continuous (nonstop) coughing interferes with work or school AND [2] no improvement using cough treatment per Care Advice  Answer Assessment - Initial Assessment Questions 1. ONSET: "When did the cough begin?"      I have a cough with clear mucus.   I'm coughing a lot.   I know Clydie Braun gave me Prednisone for this before and it helped.   I just saw Clydie Braun recently for vertigo.   I let her know she would need to be seen in order for medication to be prescribed since she wasn't seen for coughing but another issue.   "I can't come back in".    "I just want her to call me in some prednisone for the coughing".   I let her know I would send a message and see if Clydie Braun would be willing to call in prednisone for her but she may want to see you first.   We'll let Clydie Braun make that decision.     I want the prednisone.     2. SEVERITY: "How bad is the cough today?"      I'm couging a lot.     It's just aggravating. 3. SPUTUM: "Describe the color of your sputum" (none, dry cough; clear, white, yellow, green)     Clear mucus. 4. HEMOPTYSIS: "Are you coughing up any blood?" If so ask: "How much?" (flecks, streaks, tablespoons, etc.)     Not asked 5. DIFFICULTY BREATHING: "Are you having difficulty breathing?" If Yes, ask: "How bad is it?" (e.g., mild, moderate, severe)    - MILD: No SOB at rest, mild SOB with walking, speaks normally in sentences, can lie down, no retractions,  pulse < 100.    - MODERATE: SOB at rest, SOB with minimal exertion and prefers to sit, cannot lie down flat, speaks in phrases, mild retractions, audible wheezing, pulse 100-120.    - SEVERE: Very SOB at rest, speaks in single words, struggling to breathe, sitting hunched forward, retractions, pulse > 120      Mild    Mostly at night 6. FEVER: "Do you have a fever?" If Yes, ask: "What is your temperature, how was it measured, and when did it start?"     No 7. CARDIAC HISTORY: "Do you have any history of heart disease?" (e.g., heart attack, congestive heart failure)      Not asked 8. LUNG HISTORY: "Do you have any history of lung disease?"  (e.g., pulmonary embolus, asthma, emphysema)     Not asked 9. PE RISK FACTORS: "Do you have a history of blood clots?" (or: recent major surgery, recent prolonged travel, bedridden)     Not asked 10. OTHER SYMPTOMS: "Do you have any other symptoms?" (e.g., runny nose, wheezing, chest pain)       Tickle in her throat.   Cough keeping her up at night 11. PREGNANCY: "Is there any chance you are  pregnant?" "When was your last menstrual period?"       N/A due to age 62. TRAVEL: "Have you traveled out of the country in the last month?" (e.g., travel history, exposures)       Not asked  Protocols used: Cough - Acute Productive-A-AH

## 2023-03-08 NOTE — Telephone Encounter (Signed)
error 

## 2023-03-08 NOTE — Telephone Encounter (Signed)
Message from Sequoyah F sent at 03/08/2023  3:47 PM EST  Summary: Medication Request   Pt is calling in because she wants to know if Dr. Laural Benes or another provider can send her in some medication for a cough since her provider left early. Pt says the cough is causing irritation and getting hard to deal with.         This encounter was created in error - please disregard.

## 2023-03-08 NOTE — Telephone Encounter (Addendum)
Pt called back in regards to her cough and wanting Prednisone. Please send to [facility] [address]

## 2023-03-11 NOTE — Telephone Encounter (Signed)
Sent patient the urgent care link to her mychart  with instructions to schedule an appointment.

## 2023-03-25 ENCOUNTER — Encounter: Admit: 2023-03-25 | Payer: PRIVATE HEALTH INSURANCE | Primary: Family Medicine

## 2023-03-25 ENCOUNTER — Inpatient Hospital Stay: Admit: 2023-03-25 | Discharge: 2023-03-25 | Payer: PRIVATE HEALTH INSURANCE | Primary: Family Medicine

## 2023-03-25 ENCOUNTER — Emergency Department: Admit: 2023-03-25 | Payer: PRIVATE HEALTH INSURANCE | Primary: Family Medicine

## 2023-03-25 ENCOUNTER — Ambulatory Visit: Payer: Self-pay

## 2023-03-25 DIAGNOSIS — R059 Cough, unspecified: Secondary | ICD-10-CM

## 2023-03-25 DIAGNOSIS — E119 Type 2 diabetes mellitus without complications: Secondary | ICD-10-CM

## 2023-03-25 DIAGNOSIS — I10 Essential (primary) hypertension: Secondary | ICD-10-CM

## 2023-03-25 DIAGNOSIS — E7800 Pure hypercholesterolemia, unspecified: Secondary | ICD-10-CM

## 2023-03-25 DIAGNOSIS — R0981 Nasal congestion: Secondary | ICD-10-CM

## 2023-03-25 DIAGNOSIS — U071 COVID-19: Principal | ICD-10-CM

## 2023-03-25 MED ORDER — AZITHROMYCIN 250 MG TABLET
250 | ORAL_TABLET | 1 refills | Status: AC
Start: 2023-03-25 — End: ?

## 2023-03-25 MED ORDER — GLIPIZIDE ER 2.5 MG TABLET, EXTENDED RELEASE 24 HR
2.5 | Freq: Every day | ORAL | Status: AC
Start: 2023-03-25 — End: ?

## 2023-03-25 MED ORDER — DULAGLUTIDE 3 MG/0.5 ML SUBCUTANEOUS PEN INJECTOR
3 | SUBCUTANEOUS | Status: AC
Start: 2023-03-25 — End: ?

## 2023-03-25 NOTE — Telephone Encounter (Signed)
 FYI

## 2023-03-25 NOTE — Telephone Encounter (Signed)
 Patient called, left VM to return the call to the office to speak to the NT.   Summary: weakness/cough   Patient is experiencing weakness, coughing and losing her voice. Patient is asking for a steroid for her symptoms. Please f/u with patient

## 2023-03-25 NOTE — Telephone Encounter (Signed)
  Chief Complaint: cough, congestion Symptoms: cough, congestion Frequency: patient had VV 12/20 and was treated with Prednisone  and cough medication- patient states she improved- but cough is back Pertinent Negatives: Patient denies fever SOB Disposition: [] ED /[] Urgent Care (no appt availability in office) / [] Appointment(In office/virtual)/ []  [place] [facility]/ [] Home Care/ [x] Refused Recommended Disposition /[] [place] [facility]/ []  Follow-up with PCP Additional Notes: Offered several appointments within disposition- patient declines- she has made appointment for next week- she stated a note could be sent to PCP to see if something could be called in. Patient advised they would want to see her since she was seen virtually and treated- now sick again.    Reason for Disposition  [1] Continuous (nonstop) coughing interferes with work or school AND [2] no improvement using cough treatment per Care Advice  Answer Assessment - Initial Assessment Questions 1. ONSET: When did the cough begin?      Patient has been sick- 3 weeks- did take prednisone  which helped- cough is back 2. SEVERITY: How bad is the cough today?      Keeping her up at night 3. SPUTUM: Describe the color of your sputum (none, dry cough; clear, white, yellow, green)     Clear, thick phelm  4. HEMOPTYSIS: Are you coughing up any blood? If so ask: How much? (flecks, streaks, tablespoons, etc.)     no 5. DIFFICULTY BREATHING: Are you having difficulty breathing? If Yes, ask: How bad is it? (e.g., mild, moderate, severe)    - MILD: No SOB at rest, mild SOB with walking, speaks normally in sentences, can lie down, no retractions, pulse < 100.    - MODERATE: SOB at rest, SOB with minimal exertion and prefers to sit, cannot lie down flat, speaks in phrases, mild retractions, audible wheezing, pulse 100-120.    - SEVERE: Very SOB at rest, speaks in single words, struggling to breathe, sitting hunched forward,  retractions, pulse > 120      At times- patient uses inhaler as needed 6. FEVER: Do you have a fever? If Yes, ask: What is your temperature, how was it measured, and when did it start?     no  10. OTHER SYMPTOMS: Do you have any other symptoms? (e.g., runny nose, wheezing, chest pain)       Fatigued ,weakness  12. TRAVEL: Have you traveled out of the country in the last month? (e.g., travel history, exposures)       unknown  Protocols used: Cough - Acute Productive-A-AH

## 2023-03-25 NOTE — Discharge Instructions
 Take medications as directed and follow-up with your doctor as needed.  Check your MyChart account for your results.

## 2023-03-25 NOTE — ED Provider Notes
 Chief Complaint Patient presents with ? Cough   Cough x 2 weeks; was given Prednisone but no better; no fever or chills; did not sleep well from coughing last night HPI/PE:Patient is a 63 year old comes to the Urgent Care complaining of cough congestion too.  No fever no nausea vomiting or diarrhea.  Physical ExamED Triage Vitals [03/25/23 1503]BP: (!) 161/95Pulse: (!) 94Pulse from  O2 sat: n/aResp: 18Temp: 97.8 ?F (36.6 ?C)Temp src: n/aSpO2: 99 % BP (!) 161/95  - Pulse (!) 94  - Temp 97.8 ?F (36.6 ?C)  - Resp 18  - SpO2 99% Physical ExamVitals and nursing note reviewed. HENT:    Nose: Congestion present.    Mouth/Throat:    Mouth: Mucous membranes are moist.    Pharynx: Oropharynx is clear. No oropharyngeal exudate or posterior oropharyngeal erythema. Eyes:    Conjunctiva/sclera: Conjunctivae normal. Cardiovascular:    Rate and Rhythm: Normal rate and regular rhythm. Pulmonary:    Effort: Pulmonary effort is normal.    Breath sounds: Normal breath sounds. Musculoskeletal:    Cervical back: No rigidity or tenderness. Lymphadenopathy:    Cervical: No cervical adenopathy. Skin:   General: Skin is warm. Neurological:    Mental Status: She is alert.  ProceduresAttestation/Critical CarePatient Reevaluation: Patient likely with acute viral URI.  She is requesting a chest x-ray.Clinical Impressions as of 03/25/23 1557 Acute viral syndrome  ED DispositionNo disposition selected since last refresh of note.  Orinda Kenner, PA01/06/25 [phone]

## 2023-03-26 LAB — SARS-COV-2 (COVID-19)/INFLUENZA A+B/RSV BY RT-PCR (BH GH LMW YH)
BKR INFLUENZA A: NEGATIVE
BKR INFLUENZA B: NEGATIVE
BKR RESPIRATORY SYNCYTIAL VIRUS: NEGATIVE
BKR SARS-COV-2 RNA (COVID-19) (YH): POSITIVE — AB

## 2023-03-28 ENCOUNTER — Other Ambulatory Visit: Payer: Self-pay | Admitting: Family Medicine

## 2023-03-28 ENCOUNTER — Other Ambulatory Visit: Payer: Self-pay | Admitting: Nurse Practitioner

## 2023-04-01 NOTE — Telephone Encounter (Signed)
 Requested Prescriptions  Pending Prescriptions Disp Refills   potassium chloride  SA (KLOR-CON  M20) 20 MEQ tablet [Pharmacy Med Name: KLOR-CON  M20 TABLET] 90 tablet 0    Sig: TAKE 1 TABLET BY MOUTH ONCE DAILY     Endocrinology:  Minerals - Potassium Supplementation Passed - 04/01/2023 10:10 AM      Passed - K in normal range and within 360 days    Potassium  Date Value Ref Range Status  06/25/2022 3.7 3.5 - 5.2 mmol/L Final         Passed - Cr in normal range and within 360 days    Creatinine, Ser  Date Value Ref Range Status  06/25/2022 0.80 0.57 - 1.00 mg/dL Final         Passed - Valid encounter within last 12 months    Recent Outpatient Visits           1 month ago Needs flu shot   [place] [facility] Iago, Megan P, DO   2 months ago Type 2 diabetes mellitus with hyperglycemia, without long-term current use of insulin (HCC)   [place] [facility] Melvin Pao, NP   5 months ago Chronic migraine without aura without status migrainosus, not intractable   [place] [facility] Mecum, [name] E, PA-C   9 months ago Type 2 diabetes mellitus with hyperglycemia, without long-term current use of insulin (HCC)   [place] [facility] Ava, Megan P, DO   10 months ago Type 2 diabetes mellitus with hyperglycemia, without long-term current use of insulin [facility])   [place] [facility] Melvin Pao, NP       Future Appointments             In 3 months Melvin Pao, NP [place] [facility], [facility]

## 2023-04-01 NOTE — Telephone Encounter (Signed)
 Requested Prescriptions  Pending Prescriptions Disp Refills   carvedilol  (COREG ) 3.125 MG tablet [Pharmacy Med Name: CARVEDILOL  3.125 MG TABLET] 180 tablet 0    Sig: TAKE 1 TABLET BY MOUTH TWICE A DAY WITH MEAL     Cardiovascular: Beta Blockers 3 Failed - 04/01/2023 10:03 AM      Failed - AST in normal range and within 360 days    AST  Date Value Ref Range Status  03/05/2022 12 0 - 40 IU/L Final         Failed - ALT in normal range and within 360 days    ALT  Date Value Ref Range Status  03/05/2022 18 0 - 32 IU/L Final         Failed - Last BP in normal range    BP Readings from Last 1 Encounters:  02/22/23 (!) 149/76         Passed - Cr in normal range and within 360 days    Creatinine, Ser  Date Value Ref Range Status  06/25/2022 0.80 0.57 - 1.00 mg/dL Final         Passed - Last Heart Rate in normal range    Pulse Readings from Last 1 Encounters:  02/22/23 92         Passed - Valid encounter within last 6 months    Recent Outpatient Visits           1 month ago Needs flu shot   [place] [facility] [place], Megan P, DO   2 months ago Type 2 diabetes mellitus with hyperglycemia, without long-term current use of insulin (HCC)   [facility] [place], Darice, NP   5 months ago Chronic migraine without aura without status migrainosus, not intractable   [place] [facility] Mecum, [name] E, PA-C   9 months ago Type 2 diabetes mellitus with hyperglycemia, without long-term current use of insulin (HCC)   [place] [facility] [place], Megan P, DO   10 months ago Type 2 diabetes mellitus with hyperglycemia, without long-term current use of insulin [facility])    [facility] Melvin Darice, NP       Future Appointments             In 3 months Melvin Darice, NP  [facility], [facility]

## 2023-04-05 ENCOUNTER — Telehealth: Payer: Self-pay

## 2023-04-05 NOTE — Progress Notes (Signed)
Care Guide Pharmacy Note  04/05/2023 Name: Lacey Schaefer MRN: [id number] DOB: [birth year]  Referred By: Larae Grooms, NP Reason for referral: Care Coordination (TNM Diabetes. )   Lacey Schaefer is a 63 y.o. year old female who is a primary care patient of Larae Grooms, NP.  [name] was referred to the pharmacist for assistance related to: DMII  An unsuccessful telephone outreach was attempted today to contact the patient who was referred to the pharmacy team for assistance with diabetes. Additional attempts will be made to contact the patient.  [name]  [facility], [facility] Health Care Management Assistant Direct Dial: [phone]  Fax: [phone]

## 2023-04-11 ENCOUNTER — Telehealth: Payer: Self-pay

## 2023-04-11 NOTE — Progress Notes (Signed)
Care Guide Pharmacy Note  04/11/2023 Name: Lacey Schaefer MRN: [id number] DOB: 1960-12-23  Referred By: Larae Grooms, NP Reason for referral: Care Coordination (TNM Diabetes.)   Lacey Schaefer is a 63 y.o. year old female who is a primary care patient of Larae Grooms, NP.  [name] was referred to the pharmacist for assistance related to: DMII  A second unsuccessful telephone outreach was attempted today to contact the patient who was referred to the pharmacy team for assistance with diabetes. Additional attempts will be made to contact the patient.  [name]  [facility], [facility] Health Care Management Assistant Direct Dial: [phone]  Fax: [phone]

## 2023-04-15 ENCOUNTER — Telehealth: Payer: Self-pay

## 2023-04-15 NOTE — Progress Notes (Signed)
Care Guide Pharmacy Note  04/15/2023 Name: ANGEL HOBDY MRN: [id number] DOB: 28-Apr-1960  Referred By: Larae Grooms, NP Reason for referral: Care Coordination (TNM Diabetes. )   TANNIS BURSTEIN is a 63 y.o. year old female who is a primary care patient of Larae Grooms, NP.  [name] was referred to the pharmacist for assistance related to: DMII  Successful contact was made with the patient to discuss pharmacy services.  Patient declines engagement at this time. Contact information was provided to the patient should they wish to reach out for assistance at a later time.  [name]  [facility], [facility] Management Assistant Direct Dial: [phone]  Fax: [phone]

## 2023-04-19 DIAGNOSIS — I152 Hypertension secondary to endocrine disorders: Secondary | ICD-10-CM | POA: Diagnosis not present

## 2023-04-19 DIAGNOSIS — E785 Hyperlipidemia, unspecified: Secondary | ICD-10-CM | POA: Diagnosis not present

## 2023-04-19 DIAGNOSIS — E1165 Type 2 diabetes mellitus with hyperglycemia: Secondary | ICD-10-CM | POA: Diagnosis not present

## 2023-04-19 DIAGNOSIS — E1169 Type 2 diabetes mellitus with other specified complication: Secondary | ICD-10-CM | POA: Diagnosis not present

## 2023-04-19 DIAGNOSIS — E1159 Type 2 diabetes mellitus with other circulatory complications: Secondary | ICD-10-CM | POA: Diagnosis not present

## 2023-05-06 ENCOUNTER — Other Ambulatory Visit: Payer: Self-pay | Admitting: Nurse Practitioner

## 2023-05-06 DIAGNOSIS — Z1231 Encounter for screening mammogram for malignant neoplasm of breast: Secondary | ICD-10-CM

## 2023-05-10 LAB — HM DIABETES EYE EXAM

## 2023-06-04 DIAGNOSIS — E1169 Type 2 diabetes mellitus with other specified complication: Secondary | ICD-10-CM | POA: Diagnosis not present

## 2023-06-04 DIAGNOSIS — E1165 Type 2 diabetes mellitus with hyperglycemia: Secondary | ICD-10-CM | POA: Diagnosis not present

## 2023-06-04 DIAGNOSIS — I152 Hypertension secondary to endocrine disorders: Secondary | ICD-10-CM | POA: Diagnosis not present

## 2023-06-04 DIAGNOSIS — E785 Hyperlipidemia, unspecified: Secondary | ICD-10-CM | POA: Diagnosis not present

## 2023-06-04 DIAGNOSIS — E1159 Type 2 diabetes mellitus with other circulatory complications: Secondary | ICD-10-CM | POA: Diagnosis not present

## 2023-06-13 ENCOUNTER — Encounter: Payer: Self-pay | Admitting: Nurse Practitioner

## 2023-06-17 ENCOUNTER — Telehealth: Payer: Self-pay | Admitting: Nurse Practitioner

## 2023-06-17 ENCOUNTER — Telehealth: Payer: Self-pay

## 2023-06-17 NOTE — Telephone Encounter (Signed)
 Tried calling patient, no answer and no VM. Will try to call again later.    Ok for E2C2 clarify what the patient is needing. Last chest xray I see in the chart is from 2023. .   Call 2 of 2

## 2023-06-17 NOTE — Telephone Encounter (Signed)
 Copied from CRM [id number]. Topic: Clinical - Medication Question >> Jun 14, 2023  5:21 PM Priscille Loveless wrote: Reason for CRM: Pt is wanting to try Ozempic. Please call and advise.

## 2023-06-17 NOTE — Telephone Encounter (Signed)
 Looks like patient sees Endocrinology for her diabetes. She would need to contact them regarding this correct?

## 2023-06-17 NOTE — Telephone Encounter (Signed)
 Copied from CRM [id number]. Topic: Clinical - Lab/Test Results >> Jun 14, 2023  5:15 PM Lacey Schaefer wrote: Reason for CRM: Pt is calling about chest xray. The last one that was done for her. She is needing a copy of the xray results that was read and she will come pick it up asap. She is needing this right away. Please advise when this is ready so that she can come pick it up. [phone].

## 2023-06-17 NOTE — Telephone Encounter (Signed)
 Tried calling patient, no answer and no VM. Will try to call again later.    Ok for E2C2 to give message if patient calls back.   Call 1of 2

## 2023-06-17 NOTE — Telephone Encounter (Signed)
Patient needs to see her Endocrinologist

## 2023-06-18 NOTE — Telephone Encounter (Signed)
 Copied from CRM [id number]. Topic: Clinical - Medication Question >> Jun 18, 2023 10:07 AM Clayton Bibles wrote: Reason for CRM:  Attn: [place] I put in a request for [facility] to pick up the chest xray from 06/29/21. Lestine also said that she has taken care of the issue with Ozempic. Thanks

## 2023-06-18 NOTE — Telephone Encounter (Signed)
 Tried calling patient, no answer and no VM. Will try to call again later.    Ok for E2C2 clarify what the patient is needing. Last chest xray I see in the chart is from 2023. .   Call 2 of 2

## 2023-06-18 NOTE — Telephone Encounter (Signed)
 Tried calling patient, no answer and no VM. Will try to call again later.    Ok for E2C2 to give message if patient calls back.   Call 1of 2

## 2023-06-18 NOTE — Telephone Encounter (Signed)
 Copied from CRM [id number]. Topic: Clinical - Lab/Test Results >> Jun 18, 2023 10:02 AM Clayton Bibles wrote: Reason for CRM:  Lacey Schaefer would like to pick up a copy of DG Chest 2 View that was completed on 06/29/21. Please call Haylee when it is ready to be picked up. Thanks

## 2023-06-19 NOTE — Telephone Encounter (Signed)
 Chest x-ray results printed and placed up front for patient to pick up. Called and notified her that these were ready to be picked up.

## 2023-07-04 ENCOUNTER — Ambulatory Visit
Admission: RE | Admit: 2023-07-04 | Discharge: 2023-07-04 | Disposition: A | Discharge status: Home / Self Care | Source: Ambulatory Visit | Attending: Nurse Practitioner | Admitting: Nurse Practitioner

## 2023-07-04 DIAGNOSIS — Z1231 Encounter for screening mammogram for malignant neoplasm of breast: Secondary | ICD-10-CM | POA: Diagnosis not present

## 2023-07-11 ENCOUNTER — Ambulatory Visit: Admitting: Nurse Practitioner

## 2023-07-23 ENCOUNTER — Ambulatory Visit: Payer: Self-pay | Admitting: Nurse Practitioner

## 2023-07-23 ENCOUNTER — Other Ambulatory Visit: Payer: Self-pay | Admitting: Nurse Practitioner

## 2023-07-25 NOTE — Telephone Encounter (Signed)
Patient needs refill before upcoming appointment.

## 2023-07-25 NOTE — Telephone Encounter (Signed)
 Requested medication (s) are due for refill today: yes  Requested medication (s) are on the active medication list: yes  Last refill:  04/01/23 #180 0 refills  Future visit scheduled: yes on 07/30/23  Notes to clinic:  no refills remain. Do you want to give courtesy refill 30 day supply or 90 day supply? Last labs 06/25/22 last OV 01/23/23     Requested Prescriptions  Pending Prescriptions Disp Refills   carvedilol  (COREG ) 3.125 MG tablet [Pharmacy Med Name: CARVEDILOL  3.125 MG TABLET] 60 tablet 2    Sig: TAKE 1 TABLET BY MOUTH TWICE A DAY WITH FOOD     Cardiovascular: Beta Blockers 3 Failed - 07/25/2023  9:20 AM      Failed - Cr in normal range and within 360 days    Creatinine, Ser  Date Value Ref Range Status  06/25/2022 0.80 0.57 - 1.00 mg/dL Final         Failed - AST in normal range and within 360 days    AST  Date Value Ref Range Status  03/05/2022 12 0 - 40 IU/L Final         Failed - ALT in normal range and within 360 days    ALT  Date Value Ref Range Status  03/05/2022 18 0 - 32 IU/L Final         Failed - Last BP in normal range    BP Readings from Last 1 Encounters:  02/22/23 (!) 149/76         Failed - Valid encounter within last 6 months    Recent Outpatient Visits   None            Passed - Last Heart Rate in normal range    Pulse Readings from Last 1 Encounters:  02/22/23 92

## 2023-07-30 ENCOUNTER — Encounter: Payer: Self-pay | Admitting: Nurse Practitioner

## 2023-07-30 ENCOUNTER — Ambulatory Visit: Admitting: Nurse Practitioner

## 2023-07-30 VITALS — BP 136/71 | HR 83 | Temp 98.4°F | Resp 15 | Ht 64.02 in | Wt 184.2 lb

## 2023-07-30 DIAGNOSIS — E1159 Type 2 diabetes mellitus with other circulatory complications: Secondary | ICD-10-CM | POA: Diagnosis not present

## 2023-07-30 DIAGNOSIS — E785 Hyperlipidemia, unspecified: Secondary | ICD-10-CM | POA: Diagnosis not present

## 2023-07-30 DIAGNOSIS — E1169 Type 2 diabetes mellitus with other specified complication: Secondary | ICD-10-CM | POA: Diagnosis not present

## 2023-07-30 DIAGNOSIS — E1165 Type 2 diabetes mellitus with hyperglycemia: Secondary | ICD-10-CM

## 2023-07-30 DIAGNOSIS — E119 Type 2 diabetes mellitus without complications: Secondary | ICD-10-CM | POA: Diagnosis not present

## 2023-07-30 DIAGNOSIS — I152 Hypertension secondary to endocrine disorders: Secondary | ICD-10-CM

## 2023-07-30 DIAGNOSIS — Z7985 Long-term (current) use of injectable non-insulin antidiabetic drugs: Secondary | ICD-10-CM

## 2023-07-30 LAB — MICROALBUMIN, URINE WAIVED
Creatinine, Urine Waived: 200 mg/dL (ref 10–300)
Microalb, Ur Waived: 150 mg/L — ABNORMAL HIGH (ref 0–19)

## 2023-07-30 NOTE — Progress Notes (Unsigned)
 BP 136/71 (BP Location: Left Arm, Patient Position: Sitting, Cuff Size: Large)   Pulse 83   Temp 98.4 F (36.9 C) (Oral)   Resp 15   Ht 5' 4.02" (1.626 m)   Wt 184 lb 3.2 oz (83.6 kg)   SpO2 98%   BMI 31.60 kg/m    Subjective:    Patient ID: Lacey Schaefer, female    DOB: 08-27-1960, 63 y.o.   MRN: [id number]  HPI: Lacey Schaefer is a 63 y.o. female  Chief Complaint  Patient presents with   Hypertension    Concerned she might be high today.    Diabetes    Checks every few days at home. Managed by endo and follow up next month.    Chest Pain    Doesn't linger so wonders if muscle related. See's cardiology tomorrow and plans to mention.    HYPERTENSION / HYPERLIPIDEMIA Satisfied with current treatment? yes Duration of hypertension: years BP monitoring frequency: daily BP range: 128/80 BP medication side effects: no Past BP meds: hydralazine , amlodipine , carvedilol , and HCTZ Duration of hyperlipidemia: years Cholesterol medication side effects: no Cholesterol supplements: none Past cholesterol medications: pravastatin  (pravachol ) Medication compliance: excellent compliance Aspirin: yes Recent stressors: no Recurrent headaches: no Visual changes: no Palpitations: no Dyspnea: no Chest pain: no Lower extremity edema: no Dizzy/lightheaded: no  DIABETES Patient is seeing Endocrinology in March- was switched to Ozempic .  She did not pick up the Trulicity .  A1c was 10.4% to 9.7%.  She has another appt in June.     She is taking Glipizide , and Trulicity  4.5mg . Hypoglycemic episodes:no Polydipsia/polyuria: no Visual disturbance: no Chest pain: no Paresthesias: no Glucose Monitoring: no  Accucheck frequency: 113  Fasting glucose:  Post prandial:  Evening:  Before meals: Taking Insulin?: no  Long acting insulin:  Short acting insulin: Blood Pressure Monitoring: daily Retinal Examination: up to date Foot Exam: Up to Date Diabetic Education: Not  Completed Pneumovax: Not up to Date- will get at next visit Influenza: up to date Aspirin: no    Relevant past medical, surgical, family and social history reviewed and updated as indicated. Interim medical history since our last visit reviewed. Allergies and medications reviewed and updated.  Review of Systems  Eyes:  Negative for visual disturbance.  Respiratory:  Negative for cough, chest tightness and shortness of breath.   Cardiovascular:  Negative for chest pain, palpitations and leg swelling.  Endocrine: Negative for polydipsia and polyuria.  Musculoskeletal:        Right shoulder pain  Neurological:  Negative for dizziness, numbness and headaches.    Per HPI unless specifically indicated above     Objective:    BP 136/71 (BP Location: Left Arm, Patient Position: Sitting, Cuff Size: Large)   Pulse 83   Temp 98.4 F (36.9 C) (Oral)   Resp 15   Ht 5' 4.02" (1.626 m)   Wt 184 lb 3.2 oz (83.6 kg)   SpO2 98%   BMI 31.60 kg/m   Wt Readings from Last 3 Encounters:  07/30/23 184 lb 3.2 oz (83.6 kg)  02/22/23 185 lb 3.2 oz (84 kg)  01/23/23 184 lb 9.6 oz (83.7 kg)    Physical Exam Vitals and nursing note reviewed.  Constitutional:      General: She is not in acute distress.    Appearance: Normal appearance. She is normal weight. She is not ill-appearing, toxic-appearing or diaphoretic.  HENT:     Head: Normocephalic.     Right Ear:  External ear normal.     Left Ear: External ear normal.     Nose: Nose normal.     Mouth/Throat:     Mouth: Mucous membranes are moist.     Pharynx: Oropharynx is clear.  Eyes:     General:        Right eye: No discharge.        Left eye: No discharge.     Extraocular Movements: Extraocular movements intact.     Conjunctiva/sclera: Conjunctivae normal.     Pupils: Pupils are equal, round, and reactive to light.  Cardiovascular:     Rate and Rhythm: Normal rate and regular rhythm.     Heart sounds: No murmur heard. Pulmonary:      Effort: Pulmonary effort is normal. No respiratory distress.     Breath sounds: Normal breath sounds. No wheezing or rales.  Musculoskeletal:     Cervical back: Normal range of motion and neck supple.  Skin:    General: Skin is warm and dry.     Capillary Refill: Capillary refill takes less than 2 seconds.  Neurological:     General: No focal deficit present.     Mental Status: She is alert and oriented to person, place, and time. Mental status is at baseline.  Psychiatric:        Mood and Affect: Mood normal.        Behavior: Behavior normal.        Thought Content: Thought content normal.        Judgment: Judgment normal.     Results for orders placed or performed in visit on 07/30/23  Comprehensive metabolic panel with GFR   Collection Time: 07/30/23  4:08 PM  Result Value Ref Range   Glucose 256 (H) 70 - 99 mg/dL   BUN 13 8 - 27 mg/dL   Creatinine, Ser 1.61 0.57 - 1.00 mg/dL   eGFR 80 >09 UE/AVW/0.98   BUN/Creatinine Ratio 16 12 - 28   Sodium 141 134 - 144 mmol/L   Potassium 3.2 (L) 3.5 - 5.2 mmol/L   Chloride 97 96 - 106 mmol/L   CO2 25 20 - 29 mmol/L   Calcium 9.2 8.7 - 10.3 mg/dL   Total Protein 6.1 6.0 - 8.5 g/dL   Albumin 4.0 3.9 - 4.9 g/dL   Globulin, Total 2.1 1.5 - 4.5 g/dL   Bilirubin Total 0.3 0.0 - 1.2 mg/dL   Alkaline Phosphatase 97 44 - 121 IU/L   AST 10 0 - 40 IU/L   ALT 13 0 - 32 IU/L  Hemoglobin A1c   Collection Time: 07/30/23  4:08 PM  Result Value Ref Range   Hgb A1c MFr Bld 9.7 (H) 4.8 - 5.6 %   Est. average glucose Bld gHb Est-mCnc 232 mg/dL  Lipid panel   Collection Time: 07/30/23  4:08 PM  Result Value Ref Range   Cholesterol, Total 193 100 - 199 mg/dL   Triglycerides 119 (H) 0 - 149 mg/dL   HDL 47 >14 mg/dL   VLDL Cholesterol Cal 34 5 - 40 mg/dL   LDL Chol Calc (NIH) 782 (H) 0 - 99 mg/dL   Chol/HDL Ratio 4.1 0.0 - 4.4 ratio  Microalbumin, Urine Waived   Collection Time: 07/30/23  4:08 PM  Result Value Ref Range   Microalb, Ur Waived  150 (H) 0 - 19 mg/L   Creatinine, Urine Waived 200 10 - 300 mg/dL   Microalb/Creat Ratio 30-300 (H) <30 mg/g  Assessment & Plan:   Problem List Items Addressed This Visit       Cardiovascular and Mediastinum   Hypertension associated with diabetes (HCC)   Chronic.  Controlled.  Continue with current medication regimen of amlodipine , HCTZ, hydralazine  and carvedilol .  Labs ordered at visit today.  Return to clinic in 6 months for reevaluation.  Call sooner if concerns arise.         Endocrine   Type 2 diabetes mellitus with hyperglycemia (HCC) - Primary   Chronic. Not well controlled.  Last A1c was 9.7%.  This is down from 10.4%.  Currently taking Metformin , Glipizide  and Trulicity .  Labs ordered at visit today. Patient is now followed by Endocrinology.  Reports that she is taking Trulicity  4.5mg  weekly.  Has not been changed to Ozempic .  Reviewed last note from Endocrinology.  Continue to collaborate with specialist.  Follow up in 6 months.  Call sooner if concerns arise.       Relevant Orders   Comprehensive metabolic panel with GFR (Completed)   Hemoglobin A1c (Completed)   Microalbumin, Urine Waived (Completed)   Hyperlipidemia associated with type 2 diabetes mellitus (HCC)   Chronic.  Controlled.  Continue with current medication regimen of Pravastatin  daily.  Labs ordered at visit today.  Return to clinic in 6 months for reevaluation.  Call sooner if concerns arise.       Relevant Orders   Lipid panel (Completed)   Diabetes mellitus treated with injections of non-insulin medication (HCC)     Follow up plan: Return in about 6 months (around 01/30/2024) for HTN, HLD, DM2 FU.

## 2023-07-31 ENCOUNTER — Ambulatory Visit: Payer: Self-pay | Admitting: Nurse Practitioner

## 2023-07-31 DIAGNOSIS — E1159 Type 2 diabetes mellitus with other circulatory complications: Secondary | ICD-10-CM

## 2023-07-31 DIAGNOSIS — E119 Type 2 diabetes mellitus without complications: Secondary | ICD-10-CM | POA: Insufficient documentation

## 2023-07-31 DIAGNOSIS — Z7985 Type 2 diabetes mellitus without complications: Secondary | ICD-10-CM | POA: Insufficient documentation

## 2023-07-31 LAB — LIPID PANEL
Chol/HDL Ratio: 4.1 ratio (ref 0.0–4.4)
Cholesterol, Total: 193 mg/dL (ref 100–199)
HDL: 47 mg/dL (ref >39)
LDL Chol Calc (NIH): 112 mg/dL — ABNORMAL HIGH (ref 0–99)
Triglycerides: 197 mg/dL — ABNORMAL HIGH (ref 0–149)
VLDL Cholesterol Cal: 34 mg/dL (ref 5–40)

## 2023-07-31 LAB — COMPREHENSIVE METABOLIC PANEL WITH GFR
ALT: 13 IU/L (ref 0–32)
AST: 10 IU/L (ref 0–40)
Albumin: 4 g/dL (ref 3.9–4.9)
Alkaline Phosphatase: 97 IU/L (ref 44–121)
BUN/Creatinine Ratio: 16 (ref 12–28)
BUN: 13 mg/dL (ref 8–27)
Bilirubin Total: 0.3 mg/dL (ref 0.0–1.2)
CO2: 25 mmol/L (ref 20–29)
Calcium: 9.2 mg/dL (ref 8.7–10.3)
Chloride: 97 mmol/L (ref 96–106)
Creatinine, Ser: 0.82 mg/dL (ref 0.57–1.00)
Globulin, Total: 2.1 g/dL (ref 1.5–4.5)
Glucose: 256 mg/dL — ABNORMAL HIGH (ref 70–99)
Potassium: 3.2 mmol/L — ABNORMAL LOW (ref 3.5–5.2)
Sodium: 141 mmol/L (ref 134–144)
Total Protein: 6.1 g/dL (ref 6.0–8.5)
eGFR: 80 mL/min/{1.73_m2} (ref >59)

## 2023-07-31 LAB — HEMOGLOBIN A1C
Est. average glucose Bld gHb Est-mCnc: 232 mg/dL
Hgb A1c MFr Bld: 9.7 % — ABNORMAL HIGH (ref 4.8–5.6)

## 2023-07-31 NOTE — Assessment & Plan Note (Signed)
 Chronic.  Controlled.  Continue with current medication regimen of amlodipine , HCTZ, hydralazine  and carvedilol .  Labs ordered at visit today.  Return to clinic in 6 months for reevaluation.  Call sooner if concerns arise.

## 2023-07-31 NOTE — Assessment & Plan Note (Signed)
 Chronic. Not well controlled.  Last A1c was 9.7%.  This is down from 10.4%.  Currently taking Metformin , Glipizide  and Trulicity .  Labs ordered at visit today. Patient is now followed by Endocrinology.  Reports that she is taking Trulicity  4.5mg  weekly.  Has not been changed to Ozempic .  Reviewed last note from Endocrinology.  Continue to collaborate with specialist.  Follow up in 6 months.  Call sooner if concerns arise.

## 2023-07-31 NOTE — Assessment & Plan Note (Signed)
 Chronic.  Controlled.  Continue with current medication regimen of Pravastatin  daily.  Labs ordered at visit today.  Return to clinic in 6 months for reevaluation.  Call sooner if concerns arise.

## 2023-08-18 ENCOUNTER — Other Ambulatory Visit: Payer: Self-pay | Admitting: Nurse Practitioner

## 2023-08-19 ENCOUNTER — Other Ambulatory Visit: Payer: Self-pay

## 2023-08-19 ENCOUNTER — Emergency Department
Admission: EM | Admit: 2023-08-19 | Discharge: 2023-08-19 | Disposition: A | Discharge status: Home / Self Care | Attending: Emergency Medicine | Admitting: Emergency Medicine

## 2023-08-19 ENCOUNTER — Emergency Department

## 2023-08-19 DIAGNOSIS — M791 Myalgia, unspecified site: Secondary | ICD-10-CM | POA: Diagnosis not present

## 2023-08-19 DIAGNOSIS — I10 Essential (primary) hypertension: Secondary | ICD-10-CM | POA: Diagnosis not present

## 2023-08-19 DIAGNOSIS — R109 Unspecified abdominal pain: Secondary | ICD-10-CM | POA: Diagnosis not present

## 2023-08-19 DIAGNOSIS — R0781 Pleurodynia: Secondary | ICD-10-CM | POA: Insufficient documentation

## 2023-08-19 DIAGNOSIS — Z1331 Encounter for screening for depression: Secondary | ICD-10-CM | POA: Diagnosis not present

## 2023-08-19 DIAGNOSIS — M7918 Myalgia, other site: Secondary | ICD-10-CM

## 2023-08-19 DIAGNOSIS — R0789 Other chest pain: Secondary | ICD-10-CM | POA: Diagnosis not present

## 2023-08-19 DIAGNOSIS — E119 Type 2 diabetes mellitus without complications: Secondary | ICD-10-CM | POA: Diagnosis not present

## 2023-08-19 LAB — CBC
HCT: 40.5 % (ref 36.0–46.0)
Hemoglobin: 12.7 g/dL (ref 12.0–15.0)
MCH: 27.7 pg (ref 26.0–34.0)
MCHC: 31.4 g/dL (ref 30.0–36.0)
MCV: 88.4 fL (ref 80.0–100.0)
Platelets: 343 10*3/uL (ref 150–400)
RBC: 4.58 MIL/uL (ref 3.87–5.11)
RDW: 14.6 % (ref 11.5–15.5)
WBC: 6.2 10*3/uL (ref 4.0–10.5)
nRBC: 0 % (ref 0.0–0.2)

## 2023-08-19 LAB — URINALYSIS, ROUTINE W REFLEX MICROSCOPIC
Bacteria, UA: NONE SEEN
Bilirubin Urine: NEGATIVE
Glucose, UA: NEGATIVE mg/dL
Hgb urine dipstick: NEGATIVE
Ketones, ur: NEGATIVE mg/dL
Leukocytes,Ua: NEGATIVE
Nitrite: NEGATIVE
Protein, ur: 30 mg/dL — AB
Specific Gravity, Urine: 1.019 (ref 1.005–1.030)
pH: 5 (ref 5.0–8.0)

## 2023-08-19 LAB — COMPREHENSIVE METABOLIC PANEL WITH GFR
ALT: 16 U/L (ref 0–44)
AST: 11 U/L — ABNORMAL LOW (ref 15–41)
Albumin: 3.7 g/dL (ref 3.5–5.0)
Alkaline Phosphatase: 60 U/L (ref 38–126)
Anion gap: 9 (ref 5–15)
BUN: 15 mg/dL (ref 8–23)
CO2: 29 mmol/L (ref 22–32)
Calcium: 9.3 mg/dL (ref 8.9–10.3)
Chloride: 103 mmol/L (ref 98–111)
Creatinine, Ser: 0.87 mg/dL (ref 0.44–1.00)
GFR, Estimated: >60 mL/min (ref >60)
Glucose, Bld: 217 mg/dL — ABNORMAL HIGH (ref 70–99)
Potassium: 3.2 mmol/L — ABNORMAL LOW (ref 3.5–5.1)
Sodium: 141 mmol/L (ref 135–145)
Total Bilirubin: 0.4 mg/dL (ref 0.0–1.2)
Total Protein: 6.8 g/dL (ref 6.5–8.1)

## 2023-08-19 LAB — MAGNESIUM: Magnesium: 2.1 mg/dL (ref 1.7–2.4)

## 2023-08-19 LAB — LIPASE, BLOOD: Lipase: 41 U/L (ref 11–51)

## 2023-08-19 LAB — TROPONIN I (HIGH SENSITIVITY): Troponin I (High Sensitivity): 12 ng/L (ref <18)

## 2023-08-19 MED ORDER — POTASSIUM CHLORIDE CRYS ER 20 MEQ PO TBCR
40.0000 meq | EXTENDED_RELEASE_TABLET | Freq: Once | ORAL | Status: AC
  Administered 2023-08-19: 40 meq via ORAL
  Filled 2023-08-19: qty 2

## 2023-08-19 MED ORDER — KETOROLAC TROMETHAMINE 30 MG/ML IJ SOLN
30.0000 mg | Freq: Once | INTRAMUSCULAR | Status: AC
  Administered 2023-08-19: 30 mg via INTRAMUSCULAR
  Filled 2023-08-19: qty 1

## 2023-08-19 NOTE — ED Provider Notes (Signed)
 [facility] Provider Note    Event Date/Time   First MD Initiated Contact with Patient 08/19/23 1809     (approximate)   History   Abdominal Pain   HPI  Lacey Schaefer is a 63 y.o. female who presents to the ED for evaluation of Abdominal Pain   Review a PCP visit from 5/13.  History of HTN, HLD and DM  Patient presents alongside her spouse for evaluation of left-sided chest wall/flank discomfort intermittent over the past 2 months.  Reports pain with lifting, shifting but not climbing steps.  Pain improves temporarily with Tylenol  but often recurs.  No trauma or falls.  No nausea or emesis, urinary or stool changes, dizziness or syncope   Physical Exam   Triage Vital Signs: ED Triage Vitals  Encounter Vitals Group     BP 08/19/23 1714 (!) 150/87     Systolic BP Percentile --      Diastolic BP Percentile --      Pulse Rate 08/19/23 1714 91     Resp 08/19/23 1714 16     Temp 08/19/23 1714 98.9 F (37.2 C)     Temp Source 08/19/23 1714 Oral     SpO2 08/19/23 1714 98 %     Weight --      Height --      Head Circumference --      Peak Flow --      Pain Score 08/19/23 1715 8     Pain Loc --      Pain Education --      Exclude from Growth Chart --     Most recent vital signs: Vitals:   08/19/23 1714 08/19/23 1930  BP: (!) 150/87 129/71  Pulse: 91 90  Resp: 16 16  Temp: 98.9 F (37.2 C) 98.8 F (37.1 C)  SpO2: 98% 95%    General: Awake, no distress.  CV:  Good peripheral perfusion.  Resp:  Normal effort.  Abd:  No distention.  LUQ abdomen and CVA without tenderness. MSK:  No deformity noted.  Mild tenderness to palpation to inferior lateral ribs on the left side without overlying skin changes, rash or signs of trauma.  This reproduces her symptoms Neuro:  No focal deficits appreciated. Other:     ED Results / Procedures / Treatments   Labs (all labs ordered are listed, but only abnormal results are displayed) Labs Reviewed   COMPREHENSIVE METABOLIC PANEL WITH GFR - Abnormal; Notable for the following components:      Result Value   Potassium 3.2 (*)    Glucose, Bld 217 (*)    AST 11 (*)    All other components within normal limits  URINALYSIS, ROUTINE W REFLEX MICROSCOPIC - Abnormal; Notable for the following components:   Color, Urine YELLOW (*)    APPearance CLEAR (*)    Protein, ur 30 (*)    All other components within normal limits  LIPASE, BLOOD  CBC  MAGNESIUM  TROPONIN I (HIGH SENSITIVITY)    EKG Treatment is baseline clouds fine detail but demonstrates a sinus rhythm with a rate of 88 bpm.  Normal axis and intervals without clear signs of acute ischemia.  RADIOLOGY CXR interpreted by me without evidence of acute cardiopulmonary pathology.  Official radiology report(s): DG Chest 2 View Result Date: 08/19/2023 CLINICAL DATA:  left chest wall pain EXAM: CHEST - 2 VIEW COMPARISON:  None available. FINDINGS: No focal airspace consolidation, pleural effusion, or pneumothorax. No cardiomegaly. No  acute, displaced fracture or destructive lesion. IMPRESSION: No acute cardiopulmonary abnormality. Electronically Signed   By: Rance Burrows M.D.   On: 08/19/2023 19:15    PROCEDURES and INTERVENTIONS:  .1-3 Lead EKG Interpretation  Performed by: Arline Bennett, MD Authorized by: Arline Bennett, MD     Interpretation: normal     ECG rate:  86   ECG rate assessment: normal     Rhythm: sinus rhythm     Ectopy: none     Conduction: normal     Medications  potassium chloride  SA (KLOR-CON  M) CR tablet 40 mEq (40 mEq Oral Given 08/19/23 1836)  ketorolac (TORADOL) 30 MG/ML injection 30 mg (30 mg Intramuscular Given 08/19/23 1838)     IMPRESSION / MDM / ASSESSMENT AND PLAN / ED COURSE  I reviewed the triage vital signs and the nursing notes.  Differential diagnosis includes, but is not limited to, shingles, muscular pain, pneumothorax, pyelonephritis, electrolyte derangement, gastritis  {Patient presents  with symptoms of an acute illness or injury that is potentially life-threatening.  Patient presents with left-sided chest wall and flank discomfort that is likely muscular etiology.  Discretely tender over inferior ribs laterally over the left side.  No rash or trauma or skin changes.  EKG is nonischemic, potassium is mildly low and he will replace this orally.  Normal lipase and CBC.  Will obtain a CXR, urinalysis and add on a troponin.  CXR is clear, normal troponin and normal UA.  Pain resolved with Toradol.  Suitable for outpatient management  Clinical Course as of 08/19/23 2037  Mon Aug 19, 2023  2036 Reassessed, pain-free, discussed reassuring workup, plan of care [DS]    Clinical Course User Index [DS] Arline Bennett, MD     FINAL CLINICAL IMPRESSION(S) / ED DIAGNOSES   Final diagnoses:  Left flank pain  Musculoskeletal pain     Rx / DC Orders   ED Discharge Orders     None        Note:  This document was prepared using Dragon voice recognition software and may include unintentional dictation errors.   Arline Bennett, MD 08/19/23 2037

## 2023-08-19 NOTE — Discharge Instructions (Signed)
Please take Tylenol and ibuprofen/Advil for your pain.  It is safe to take them together, or to alternate them every few hours.  Take up to 1000mg of Tylenol at a time, up to 4 times per day.  Do not take more than 4000 mg of Tylenol in 24 hours.  For ibuprofen, take 400-600 mg, 3 - 4 times per day.  

## 2023-08-19 NOTE — ED Notes (Signed)
 Pt reporting to ED d/t LUQ abdominal pain for 2 months. Pt states that the pain has been intermittent and denies any injuries to the area. Pt denies N/V/D. Pt ABCs intact. RR even and unlabored. Pt in NAD. Bed in lowest locked position. Call bell in reach. Pt requesting graham crackers and TV remote while awaiting results.   Past Medical History:  Diagnosis Date   Asthma    Diabetes mellitus without complication (HCC)    Dyspnea    Hypertension

## 2023-08-19 NOTE — Telephone Encounter (Signed)
 Requested Prescriptions  Pending Prescriptions Disp Refills   carvedilol  (COREG ) 3.125 MG tablet [Pharmacy Med Name: CARVEDILOL  3.125 MG TABLET] 180 tablet 1    Sig: TAKE 1 TABLET BY MOUTH TWICE A DAY WITH FOOD     Cardiovascular: Beta Blockers 3 Failed - 08/19/2023  5:18 PM      Failed - Last BP in normal range    BP Readings from Last 1 Encounters:  08/19/23 (!) 150/87         Failed - Valid encounter within last 6 months    Recent Outpatient Visits           2 weeks ago Type 2 diabetes mellitus with hyperglycemia, without long-term current use of insulin (HCC)   [name] [name], NP              Passed - Cr in normal range and within 360 days    Creatinine, Ser  Date Value Ref Range Status  07/30/2023 0.82 0.57 - 1.00 mg/dL Final         Passed - AST in normal range and within 360 days    AST  Date Value Ref Range Status  07/30/2023 10 0 - 40 IU/L Final         Passed - ALT in normal range and within 360 days    ALT  Date Value Ref Range Status  07/30/2023 13 0 - 32 IU/L Final         Passed - Last Heart Rate in normal range    Pulse Readings from Last 1 Encounters:  08/19/23 91

## 2023-08-19 NOTE — ED Notes (Signed)
 Pt ABCs intact. RR even and unlabored. Pt in NAD. Bed in lowest locked position. Call bell in reach. Denies needs at this time.

## 2023-08-19 NOTE — ED Triage Notes (Signed)
 Pt to ED via POV from [facility] Pt reports left sided flank pain x2 months. Pt reports pain has been intermittent. Pt sent for imaging. Pt denies injury, N/V/D, SOB or CP.

## 2023-08-19 NOTE — ED Notes (Signed)
 Pt discharged to ED circle at this time and left with all belongings. Pt ABCs intact. RR even and unlabored. Pt in NAD. Pt denies further needs from this RN.

## 2023-08-28 DIAGNOSIS — R002 Palpitations: Secondary | ICD-10-CM | POA: Diagnosis not present

## 2023-08-28 DIAGNOSIS — E782 Mixed hyperlipidemia: Secondary | ICD-10-CM | POA: Diagnosis not present

## 2023-08-28 DIAGNOSIS — I10 Essential (primary) hypertension: Secondary | ICD-10-CM | POA: Diagnosis not present

## 2023-08-28 DIAGNOSIS — R0789 Other chest pain: Secondary | ICD-10-CM | POA: Diagnosis not present

## 2023-09-16 DIAGNOSIS — R0789 Other chest pain: Secondary | ICD-10-CM | POA: Diagnosis not present

## 2023-09-18 ENCOUNTER — Other Ambulatory Visit: Payer: Self-pay | Admitting: Nurse Practitioner

## 2023-09-20 NOTE — Telephone Encounter (Signed)
 Requested Prescriptions  Pending Prescriptions Disp Refills   VENTOLIN  HFA 108 (90 Base) MCG/ACT inhaler [Pharmacy Med Name: VENTOLIN  HFA 90 MCG INHALER] 18 each 1    Sig: INHALE 2 PUFFS BY MOUTH EVERY 6 HOURS AS NEEDED FOR WHEEZING OR SHORTNESS OF BREATH     Pulmonology:  Beta Agonists 2 Failed - 09/20/2023  5:00 PM      Failed - Last BP in normal range    BP Readings from Last 1 Encounters:  08/19/23 (!) 148/86         Passed - Last Heart Rate in normal range    Pulse Readings from Last 1 Encounters:  08/19/23 80         Passed - Valid encounter within last 12 months    Recent Outpatient Visits           1 month ago Type 2 diabetes mellitus with hyperglycemia, without long-term current use of insulin [facility])   [facility] Melvin Pao, NP

## 2023-10-09 ENCOUNTER — Other Ambulatory Visit: Payer: Self-pay | Admitting: Nurse Practitioner

## 2023-10-09 ENCOUNTER — Other Ambulatory Visit: Payer: Self-pay

## 2023-10-09 DIAGNOSIS — G43109 Migraine with aura, not intractable, without status migrainosus: Secondary | ICD-10-CM | POA: Diagnosis not present

## 2023-10-09 DIAGNOSIS — G43709 Chronic migraine without aura, not intractable, without status migrainosus: Secondary | ICD-10-CM

## 2023-10-09 DIAGNOSIS — E119 Type 2 diabetes mellitus without complications: Secondary | ICD-10-CM | POA: Diagnosis not present

## 2023-10-09 DIAGNOSIS — H2513 Age-related nuclear cataract, bilateral: Secondary | ICD-10-CM | POA: Diagnosis not present

## 2023-10-09 MED ORDER — BUTALBITAL-APAP-CAFFEINE 50-325-40 MG PO TABS: ORAL_TABLET | ORAL | 0 refills | Status: DC

## 2023-10-09 NOTE — Telephone Encounter (Signed)
 Medication sent to the pharmacy.

## 2023-10-09 NOTE — Telephone Encounter (Signed)
 Copied from CRM [id number]. Topic: Clinical - Medication Refill >> Oct 09, 2023 10:49 AM [name] wrote: Medication: butalbital -acetaminophen -caffeine  (FIORICET) 50-325-40 MG tablet [[id number]]  Has the patient contacted their pharmacy? Yes- She is out of refills (Agent: If no, request that the patient contact the pharmacy for the refill. If patient does not wish to contact the pharmacy document the reason why and proceed with request.) (Agent: If yes, when and what did the pharmacy advise?)  This is the patient's preferred pharmacy:   [id number] [place], [place] - [name] [place] [address] Phone: [phone] Fax: [phone]  Is this the correct pharmacy for this prescription? Yes If no, delete pharmacy and type the correct one.   Has the prescription been filled recently? No  Is the patient out of the medication? Yes  Has the patient been seen for an appointment in the last year OR does the patient have an upcoming appointment? Yes  Can we respond through MyChart? No  Agent: Please be advised that Rx refills may take up to 3 business days. We ask that you follow-up with your pharmacy. >> Oct 09, 2023  3:11 PM Montie POUR wrote: Lacey Schaefer is calling back because she has a bad headache and needs medication. I let her know that it could take up to 3 business days to get to her pharmacy. She hopes it will be ready today. Thanks

## 2023-10-09 NOTE — Telephone Encounter (Unsigned)
 Copied from CRM [id number]. Topic: Clinical - Medication Refill >> Oct 09, 2023 10:49 AM [name] wrote: Medication: butalbital -acetaminophen -caffeine  (FIORICET) 50-325-40 MG tablet [[id number]]  Has the patient contacted their pharmacy? Yes- She is out of refills (Agent: If no, request that the patient contact the pharmacy for the refill. If patient does not wish to contact the pharmacy document the reason why and proceed with request.) (Agent: If yes, when and what did the pharmacy advise?)  This is the patient's preferred pharmacy:   [id number] [place], [place] - [address] [name] [address] Phone: [phone] Fax: [phone]  Is this the correct pharmacy for this prescription? Yes If no, delete pharmacy and type the correct one.   Has the prescription been filled recently? No  Is the patient out of the medication? Yes  Has the patient been seen for an appointment in the last year OR does the patient have an upcoming appointment? Yes  Can we respond through MyChart? No  Agent: Please be advised that Rx refills may take up to 3 business days. We ask that you follow-up with your pharmacy.

## 2023-10-09 NOTE — Telephone Encounter (Signed)
 Copied from CRM [id number]. Topic: Clinical - Medication Refill >> Oct 09, 2023 10:49 AM Lacey Schaefer wrote: Medication: butalbital -acetaminophen -caffeine  (FIORICET) 50-325-40 MG tablet [[id number]]  Has the patient contacted their pharmacy? Yes- She is out of refills (Agent: If no, request that the patient contact the pharmacy for the refill. If patient does not wish to contact the pharmacy document the reason why and proceed with request.) (Agent: If yes, when and what did the pharmacy advise?)  This is the patient's preferred pharmacy:   [id number] [place], [place] - [address] [name] [address] Phone: [phone] Fax: [phone]  Is this the correct pharmacy for this prescription? Yes If no, delete pharmacy and type the correct one.   Has the prescription been filled recently? No  Is the patient out of the medication? Yes  Has the patient been seen for an appointment in the last year OR does the patient have an upcoming appointment? Yes  Can we respond through MyChart? No  Agent: Please be advised that Rx refills may take up to 3 business days. We ask that you follow-up with your pharmacy. >> Oct 09, 2023  3:11 PM Lacey Schaefer wrote: Lacey Schaefer is calling back because she has a bad headache and needs medication. I let her know that it could take up to 3 business days to get to her pharmacy. She hopes it will be ready today. Thanks

## 2023-10-10 ENCOUNTER — Other Ambulatory Visit: Payer: Self-pay | Admitting: Nurse Practitioner

## 2023-10-10 DIAGNOSIS — G43709 Chronic migraine without aura, not intractable, without status migrainosus: Secondary | ICD-10-CM

## 2023-10-11 NOTE — Telephone Encounter (Unsigned)
 Copied from CRM #[id number]. Topic: Clinical - Prescription Issue >> Oct 11, 2023  9:39 AM Selinda RAMAN wrote: Reason for CRM: The patient called in stating her refill for her butalbital -acetaminophen -caffeine  (FIORICET) 50-325-40 MG tablet was sent to the wrong pharmacy. It was sent to [facility] in [place] and although she goes there several times  a year she [facility] [place] on [address]. I gave [place] the heads up. Please assist patient further by [facility].

## 2023-10-11 NOTE — Telephone Encounter (Signed)
 Duplicate request, refilled 10/09/23.  Requested Prescriptions  Pending Prescriptions Disp Refills   butalbital -acetaminophen -caffeine  (FIORICET) 50-325-40 MG tablet 14 tablet 0    Sig: TAKE 1 TABLET BY MOUTH EVERY 6 HOURS AS NEEDED FOR HEADACHE     Not Delegated - Analgesics:  Non-Opioid Analgesic Combinations 2 Failed - 10/11/2023  9:04 AM      Failed - This refill cannot be delegated      Passed - Cr in normal range and within 360 days    Creatinine, Ser  Date Value Ref Range Status  08/19/2023 0.87 0.44 - 1.00 mg/dL Final         Passed - eGFR is 10 or above and within 360 days    GFR calc Af Amer  Date Value Ref Range Status  04/22/2020 106 >59 mL/min/1.73 Final    Comment:    **In accordance with recommendations from the NKF-ASN Task force,**   [facility] is in the process of updating its eGFR calculation to the   2021 CKD-EPI creatinine equation that estimates kidney function   without a race variable.    GFR, Estimated  Date Value Ref Range Status  08/19/2023 >60 >60 mL/min Final    Comment:    (NOTE) Calculated using the CKD-EPI Creatinine Equation (2021)    eGFR  Date Value Ref Range Status  07/30/2023 80 >59 mL/min/1.73 Final         Passed - Patient is not pregnant      Passed - Valid encounter within last 12 months    Recent Outpatient Visits           2 months ago Type 2 diabetes mellitus with hyperglycemia, without long-term current use of insulin [facility])   [place] Melvin Pao, NP

## 2023-10-30 ENCOUNTER — Ambulatory Visit: Payer: Self-pay

## 2023-10-30 NOTE — Telephone Encounter (Signed)
 FYI Only or Action Required?: FYI only for provider.  Patient was last seen in primary care on 07/30/2023 by Melvin Pao, NP.  Called Nurse Triage reporting Back Pain.  Symptoms began x 2 days.  Interventions attempted: OTC medications: Aleve  and Rest, hydration, or home remedies.  Symptoms are: unchanged.  Triage Disposition: See PCP When Office is Open (Within 3 Days)  Patient/caregiver understands and will follow disposition?: Yes  **Appt. Scheduled for 8/15**           Copied from CRM #[id number]. Topic: Clinical - Red Word Triage >> Oct 30, 2023 12:01 PM Rachelle R wrote: [facility] that prompted transfer to Nurse Triage: Patient has been feeling under the weather for about two days and has pain in her left side. Reason for Disposition  [1] MODERATE back pain (e.g., interferes with normal activities) AND [2] present > 3 days  Answer Assessment - Initial Assessment Questions 1. ONSET: When did the pain begin? (e.g., minutes, hours, days)     X 2 days  2. LOCATION: Where does it hurt? (upper, mid or lower back)     BIL sides of back  3. SEVERITY: How bad is the pain?  (e.g., Scale 1-10; mild, moderate, or severe)     Moderate  4. PATTERN: Is the pain constant? (e.g., yes, no; constant, intermittent)      Constant   5. RADIATION: Does the pain shoot into your legs or somewhere else?     No   6. CAUSE:  What do you think is causing the back pain?      Unknown   7. BACK OVERUSE:  Any recent lifting of heavy objects, strenuous work or exercise?     No   8. MEDICINES: What have you taken so far for the pain? (e.g., nothing, acetaminophen , NSAIDS)     Aleve   9. NEUROLOGIC SYMPTOMS: Do you have any weakness, numbness, or problems with bowel/bladder control?     No   10. OTHER SYMPTOMS: Do you have any other symptoms? (e.g., fever, abdomen pain, burning with urination, blood in urine)  No  Protocols used: Back Pain-A-AH

## 2023-11-01 ENCOUNTER — Encounter: Payer: Self-pay | Admitting: Nurse Practitioner

## 2023-11-01 ENCOUNTER — Ambulatory Visit: Admitting: Nurse Practitioner

## 2023-11-01 VITALS — BP 132/72 | HR 91 | Temp 98.4°F | Ht 64.0 in | Wt 182.6 lb

## 2023-11-01 DIAGNOSIS — R002 Palpitations: Secondary | ICD-10-CM

## 2023-11-01 DIAGNOSIS — R079 Chest pain, unspecified: Secondary | ICD-10-CM

## 2023-11-01 NOTE — Patient Instructions (Signed)
 Chest Pain (Angina): What to Know Angina is pain or discomfort in the chest. It can also be felt in the neck, arm, jaw, or back. Angina is caused by not having enough blood flow to the heart wall. Angina may be a warning that you're at risk for having a heart attack. What are the causes? Angina is most often caused by build-up of plaque in your arteries that makes it hard for blood to flow. Plaque narrows and blocks the arteries of the heart. Plaque is made of fats and cholesterol. Angina is also caused by: Sudden spasms of the muscles in the arteries of the heart. Small artery disease. Heart valve problems. A tear in an artery of your heart. Weakness of the heart muscle. What increases the risk? Main risks Having high cholesterol. High blood pressure. Having diabetes. Family history of heart disease. Not exercising or moving enough. Having had radiation treatment to the left side of your chest. Other risks Using tobacco products. Being very overweight. Eating foods that have a lot of unhealthy fats. Feeling stressed or having depression. Using drugs, such as cocaine. What are the signs or symptoms? Symptoms in all people Chest pain, which may: Feel like a crushing or squeezing in the chest. Feel like a tightness, pressure, or heaviness in the chest. Last for more than a few minutes at a time. Stop and come back. Pain in the neck, arm, jaw, or back. Heartburn or upset stomach for no reason. Being short of breath. Feeling like you may throw up. Sudden cold sweats. Other symptoms in females Tiredness or weakness. Worry and anxiety. Dizziness or fainting. How is this diagnosed?  Your symptoms and medical history. Blood tests. Electrocardiogram (ECG) to measure the electrical activity of your heart. Stress test to look for signs of a blocked artery. CT angiogram to examine your heart and the blood flow to it. Coronary angiogram to check for a blocked artery. How is this  treated? Medicines to: Prevent blood clots. Relax blood vessels and improve blood flow to the heart. Lower blood pressure. Reduce cholesterol. You may have a procedure called angioplasty to widen a narrowed or blocked artery. A small mesh tube called a stent may be put in the artery to keep it open. Surgery may be needed to allow blood to go around a blocked artery. Follow these instructions at home: Medicines Take your medicines only as told. Do not take these medicines unless your provider says that you can: NSAIDs, such as ibuprofen and naproxen. Supplements that contain vitamin A, vitamin E, or both. Hormone therapy that contains estrogen with or without progestin. Eating and drinking  Eat a healthy diet that includes: Lots of fresh fruits and vegetables. Whole grains. Low-fat protein. Low-fat dairy products. Follow instructions about what you may eat and drink. Activity Exercise as told. Talk with your provider about doing a program called cardiac rehab to help make your heart strong. When you feel tired, take a break. Plan breaks if you know you're going to feel tired. Lifestyle Do not smoke, vape, or use nicotine or tobacco. If your provider says you can drink alcohol: Limit how much you have to: 0-1 drink a day if you're female and not pregnant. 0-2 drinks a day if you're female. Know how much alcohol is in your drink. In the [place] one drink is one 12 oz bottle of beer (355 mL), one 5 oz glass of wine (148 mL), or one 1 oz glass of hard liquor (44 mL). General instructions  Stay at a healthy weight. If told to lose weight, work with your provider to lose weight safely. Keep your vaccines up to date. Get a flu shot every year. Learn to manage stress. If you need help, ask your provider. Talk with your provider if you feel depressed. Work with your provider to manage any other health problems that you have. These may include diabetes or high blood pressure. Keep all  follow-up visits. Your provider will want to check on your condition. Get help right away if: You have pain in your chest, neck, arm, jaw, or back, and the pain: Happens more often. Lasts more than a few minutes. Goes away and comes back. Does not get better after you take medicine under your tongue. You're dizzy or light-headed all of a sudden. You faint. You have any combination of these problems: Cold sweats. Heartburn or upset stomach. Trouble breathing. Feeling like you may throw up, or you throw up. Feeling very tired or weak. Feeling worried or nervous. These symptoms may be an emergency. Call 911 right away. Do not wait to see if the symptoms will go away. Do not drive yourself to the hospital. This information is not intended to replace advice given to you by your health care provider. Make sure you discuss any questions you have with your health care provider. Document Revised: 01/15/2023 Document Reviewed: 07/29/2022 Elsevier Patient Education  2024 [employer].

## 2023-11-01 NOTE — Progress Notes (Addendum)
 BP 132/72 (BP Location: Left Arm, Patient Position: Sitting)   Pulse 91   Temp 98.4 F (36.9 C) (Oral)   Ht 5' 4 (1.626 m)   Wt 182 lb 9.6 oz (82.8 kg)   SpO2 97%   BMI 31.34 kg/m    Subjective:    Patient ID: Lacey Schaefer, female    DOB: [birth year], 63 y.o.   MRN: [id number]  HPI: Lacey Schaefer is a 63 y.o. female  Chief Complaint  Patient presents with   Back Pain    Patient states she has been having random pains in her upper L back. States she did take Aleve  and use a heating pad that both helped some.    CHEST PAIN Presents to follow-up on chest pain.  Started on her left upper back. Took Aleeve and used a heating pad which helped relieve pain.  Before it left it lingered from the left to the right. Time since onset: started last weekend, last 4 days. She does follow with cardiology, last visit was 08/28/23 -- for heart palpitations and chest tightness. She returns to see them next week.  EF 55% on 09/16/23. No history of MI. Has been pain free for 2 days now.  Duration:days Onset: sudden Quality: sharp and aching Severity: 8/10 Location: left para substernal Radiation: none Episode duration:  Frequency: intermittent Related to exertion: no Activity when pain started: unknown Trauma: no Anxiety/recent stressors: occasional Aggravating factors: unknown Alleviating factors: Aleeve Status: better Treatments attempted: Aleeve  Current pain status: pain free Shortness of breath: yes occasional Cough: no Nausea: no Diaphoresis: did sweat a little Heartburn: no Palpitations: occasional     07/30/2023    3:53 PM 02/22/2023   10:14 AM 01/23/2023   11:14 AM 10/10/2022   10:22 AM 06/25/2022   10:31 AM  Depression screen PHQ 2/9  Decreased Interest 0 0 0 0 0  Down, Depressed, Hopeless 0 0 0 0 0  PHQ - 2 Score 0 0 0 0 0  Altered sleeping 0 0 0 0 0  Tired, decreased energy 0 0 0 0 0  Change in appetite 0 0 0 0 0  Feeling bad or failure about yourself  0 0 0 0 0   Trouble concentrating 0 0 0 0 0  Moving slowly or fidgety/restless 0 0 0 0 0  Suicidal thoughts 0 0 0 0 0  PHQ-9 Score 0 0 0 0 0  Difficult doing work/chores  Not difficult at all Not difficult at all Not difficult at all Not difficult at all       07/30/2023    3:53 PM 02/22/2023   10:14 AM 01/23/2023   11:14 AM 10/10/2022   10:23 AM  GAD 7 : Generalized Anxiety Score  Nervous, Anxious, on Edge 0 0 0 0  Control/stop worrying 0 0 0 0  Worry too much - different things 0 0 0 0  Trouble relaxing 0 0 0 0  Restless 0 0 0 0  Easily annoyed or irritable 0 0 0 0  Afraid - awful might happen 0 0 0 0  Total GAD 7 Score 0 0 0 0  Anxiety Difficulty  Not difficult at all Not difficult at all Not difficult at all   Relevant past medical, surgical, family and social history reviewed and updated as indicated. Interim medical history since our last visit reviewed. Allergies and medications reviewed and updated.  Review of Systems  Constitutional:  Negative for activity change, appetite change, diaphoresis,  fatigue and fever.  Respiratory:  Positive for shortness of breath. Negative for cough, chest tightness and wheezing.   Cardiovascular:  Positive for chest pain and palpitations. Negative for leg swelling.  Gastrointestinal: Negative.   Neurological: Negative.   Psychiatric/Behavioral: Negative.      Per HPI unless specifically indicated above     Objective:    BP 132/72 (BP Location: Left Arm, Patient Position: Sitting)   Pulse 91   Temp 98.4 F (36.9 C) (Oral)   Ht 5' 4 (1.626 m)   Wt 182 lb 9.6 oz (82.8 kg)   SpO2 97%   BMI 31.34 kg/m   Wt Readings from Last 3 Encounters:  11/01/23 182 lb 9.6 oz (82.8 kg)  07/30/23 184 lb 3.2 oz (83.6 kg)  02/22/23 185 lb 3.2 oz (84 kg)    Physical Exam Vitals and nursing note reviewed.  Constitutional:      General: She is awake. She is not in acute distress.    Appearance: She is well-developed and well-groomed. She is obese. She is  not ill-appearing or toxic-appearing.  HENT:     Head: Normocephalic.     Right Ear: Hearing and external ear normal.     Left Ear: Hearing and external ear normal.  Eyes:     General: Lids are normal.        Right eye: No discharge.        Left eye: No discharge.     Conjunctiva/sclera: Conjunctivae normal.     Pupils: Pupils are equal, round, and reactive to light.  Neck:     Thyroid: No thyromegaly.     Vascular: No carotid bruit.  Cardiovascular:     Rate and Rhythm: Normal rate and regular rhythm.     Heart sounds: Normal heart sounds. No murmur heard.    No gallop.  Pulmonary:     Effort: Pulmonary effort is normal. No accessory muscle usage or respiratory distress.     Breath sounds: Normal breath sounds.  Abdominal:     General: Bowel sounds are normal. There is no distension.     Palpations: Abdomen is soft.     Tenderness: There is no abdominal tenderness.  Musculoskeletal:     Cervical back: Normal range of motion and neck supple.     Right lower leg: No edema.     Left lower leg: No edema.  Lymphadenopathy:     Cervical: No cervical adenopathy.  Skin:    General: Skin is warm and dry.  Neurological:     Mental Status: She is alert and oriented to person, place, and time.     Deep Tendon Reflexes: Reflexes are normal and symmetric.     Reflex Scores:      Brachioradialis reflexes are 2+ on the right side and 2+ on the left side.      Patellar reflexes are 2+ on the right side and 2+ on the left side. Psychiatric:        Attention and Perception: Attention normal.        Mood and Affect: Mood normal.        Speech: Speech normal.        Behavior: Behavior normal. Behavior is cooperative.        Thought Content: Thought content normal.   EKG My review and personal interpretation at Time: 1645   Indication: CP Rate: 90  Rhythm: sinus Axis: normal Other: No nonspecific st abn, no stemi, no lvh -- on review similar to  past EKGs  Results for orders placed or  performed during the hospital encounter of 08/19/23  Lipase, blood   Collection Time: 08/19/23  5:16 PM  Result Value Ref Range   Lipase 41 11 - 51 U/L  Comprehensive metabolic panel   Collection Time: 08/19/23  5:16 PM  Result Value Ref Range   Sodium 141 135 - 145 mmol/L   Potassium 3.2 (L) 3.5 - 5.1 mmol/L   Chloride 103 98 - 111 mmol/L   CO2 29 22 - 32 mmol/L   Glucose, Bld 217 (H) 70 - 99 mg/dL   BUN 15 8 - 23 mg/dL   Creatinine, Ser 9.12 0.44 - 1.00 mg/dL   Calcium 9.3 8.9 - 89.6 mg/dL   Total Protein 6.8 6.5 - 8.1 g/dL   Albumin 3.7 3.5 - 5.0 g/dL   AST 11 (L) 15 - 41 U/L   ALT 16 0 - 44 U/L   Alkaline Phosphatase 60 38 - 126 U/L   Total Bilirubin 0.4 0.0 - 1.2 mg/dL   GFR, Estimated >39 >39 mL/min   Anion gap 9 5 - 15  CBC   Collection Time: 08/19/23  5:16 PM  Result Value Ref Range   WBC 6.2 4.0 - 10.5 K/uL   RBC 4.58 3.87 - 5.11 MIL/uL   Hemoglobin 12.7 12.0 - 15.0 g/dL   HCT 59.4 63.9 - 53.9 %   MCV 88.4 80.0 - 100.0 fL   MCH 27.7 26.0 - 34.0 pg   MCHC 31.4 30.0 - 36.0 g/dL   RDW 85.3 88.4 - 84.4 %   Platelets 343 150 - 400 K/uL   nRBC 0.0 0.0 - 0.2 %  Magnesium   Collection Time: 08/19/23  5:16 PM  Result Value Ref Range   Magnesium 2.1 1.7 - 2.4 mg/dL  Troponin I (High Sensitivity)   Collection Time: 08/19/23  5:16 PM  Result Value Ref Range   Troponin I (High Sensitivity) 12 <18 ng/L  Urinalysis, Routine w reflex microscopic -Urine, Clean Catch   Collection Time: 08/19/23  6:40 PM  Result Value Ref Range   Color, Urine YELLOW (A) YELLOW   APPearance CLEAR (A) CLEAR   Specific Gravity, Urine 1.019 1.005 - 1.030   pH 5.0 5.0 - 8.0   Glucose, UA NEGATIVE NEGATIVE mg/dL   Hgb urine dipstick NEGATIVE NEGATIVE   Bilirubin Urine NEGATIVE NEGATIVE   Ketones, ur NEGATIVE NEGATIVE mg/dL   Protein, ur 30 (A) NEGATIVE mg/dL   Nitrite NEGATIVE NEGATIVE   Leukocytes,Ua NEGATIVE NEGATIVE   RBC / HPF 0-5 0 - 5 RBC/hpf   WBC, UA 0-5 0 - 5 WBC/hpf   Bacteria,  UA NONE SEEN NONE SEEN   Squamous Epithelial / HPF 0-5 0 - 5 /HPF   Mucus PRESENT       Assessment & Plan:   Problem List Items Addressed This Visit       Other   Heart palpitations - Primary   Chronic issue, followed by cardiology.  Overall EKG similar to previous EKGs on exam today. She is currently pain free and has been for 2 days.  Recommend she maintain follow-up visits with cardiology, but if CP is to present again she is immediately to go to ER for assessment.      Relevant Orders   EKG 12-Lead   Chest pain   Improved after taking Aleeve, no pain for 2 days. Overall EKG similar to previous EKGs on exam today. She is currently pain free and has  been for 2 days.  Recommend she maintain follow-up visits with cardiology, but if CP is to present again she is immediately to go to ER for assessment.  She is scheduled on the 28th to see cardiology.      Relevant Orders   EKG 12-Lead    Time: 25 minutes, >50% spent counseling/or care coordination   Follow up plan: Return for as scheduled.

## 2023-11-01 NOTE — Assessment & Plan Note (Signed)
 Improved after taking Aleeve, no pain for 2 days. Overall EKG similar to previous EKGs on exam today. She is currently pain free and has been for 2 days.  Recommend she maintain follow-up visits with cardiology, but if CP is to present again she is immediately to go to ER for assessment.  She is scheduled on the 28th to see cardiology.

## 2023-11-01 NOTE — Assessment & Plan Note (Signed)
 Chronic issue, followed by cardiology.  Overall EKG similar to previous EKGs on exam today. She is currently pain free and has been for 2 days.  Recommend she maintain follow-up visits with cardiology, but if CP is to present again she is immediately to go to ER for assessment.

## 2023-11-08 ENCOUNTER — Other Ambulatory Visit: Payer: Self-pay | Admitting: Nurse Practitioner

## 2023-11-08 DIAGNOSIS — G43709 Chronic migraine without aura, not intractable, without status migrainosus: Secondary | ICD-10-CM

## 2023-11-11 NOTE — Telephone Encounter (Signed)
 Requested medication (s) are due for refill today -yes  Requested medication (s) are on the active medication list -yes  Future visit scheduled -yes  Last refill: 10/11/23 #14  Notes to clinic: non delegated Rx  Requested Prescriptions  Pending Prescriptions Disp Refills   butalbital -acetaminophen -caffeine  (FIORICET) 50-325-40 MG tablet [Pharmacy Med Name: BUTALB-ACETAMIN-CAFF 50-325-40] 14 tablet     Sig: TAKE 1 TABLET BY MOUTH EVERY 6 HOURS AS NEEDED FOR FOR HEADACHE     Not Delegated - Analgesics:  Non-Opioid Analgesic Combinations 2 Failed - 11/11/2023 12:24 PM      Failed - This refill cannot be delegated      Passed - Cr in normal range and within 360 days    Creatinine, Ser  Date Value Ref Range Status  08/19/2023 0.87 0.44 - 1.00 mg/dL Final         Passed - eGFR is 10 or above and within 360 days    GFR calc Af Amer  Date Value Ref Range Status  04/22/2020 106 >59 mL/min/1.73 Final    Comment:    **In accordance with recommendations from the NKF-ASN Task force,**   [facility] is in the process of updating its eGFR calculation to the   2021 CKD-EPI creatinine equation that estimates kidney function   without a race variable.    GFR, Estimated  Date Value Ref Range Status  08/19/2023 >60 >60 mL/min Final    Comment:    (NOTE) Calculated using the CKD-EPI Creatinine Equation (2021)    eGFR  Date Value Ref Range Status  07/30/2023 80 >59 mL/min/1.73 Final         Passed - Patient is not pregnant      Passed - Valid encounter within last 12 months    Recent Outpatient Visits           1 week ago Heart palpitations   [place] [address] [place], [place] T, NP   3 months ago Type 2 diabetes mellitus with hyperglycemia, without long-term current use of insulin (HCC)   [facility] Melvin Pao, NP                 Requested Prescriptions  Pending Prescriptions Disp Refills    butalbital -acetaminophen -caffeine  (FIORICET) 50-325-40 MG tablet [Pharmacy Med Name: BUTALB-ACETAMIN-CAFF 50-325-40] 14 tablet     Sig: TAKE 1 TABLET BY MOUTH EVERY 6 HOURS AS NEEDED FOR FOR HEADACHE     Not Delegated - Analgesics:  Non-Opioid Analgesic Combinations 2 Failed - 11/11/2023 12:24 PM      Failed - This refill cannot be delegated      Passed - Cr in normal range and within 360 days    Creatinine, Ser  Date Value Ref Range Status  08/19/2023 0.87 0.44 - 1.00 mg/dL Final         Passed - eGFR is 10 or above and within 360 days    GFR calc Af Amer  Date Value Ref Range Status  04/22/2020 106 >59 mL/min/1.73 Final    Comment:    **In accordance with recommendations from the NKF-ASN Task force,**   [facility] is in the process of updating its eGFR calculation to the   2021 CKD-EPI creatinine equation that estimates kidney function   without a race variable.    GFR, Estimated  Date Value Ref Range Status  08/19/2023 >60 >60 mL/min Final    Comment:    (NOTE) Calculated using the CKD-EPI Creatinine Equation (2021)    eGFR  Date Value Ref Range Status  07/30/2023 80 >59 mL/min/1.73 Final         Passed - Patient is not pregnant      Passed - Valid encounter within last 12 months    Recent Outpatient Visits           1 week ago Heart palpitations   [place] [place], [place] T, NP   3 months ago Type 2 diabetes mellitus with hyperglycemia, without long-term current use of insulin [facility])   [place] [facility] Melvin Pao, NP

## 2023-11-14 DIAGNOSIS — I10 Essential (primary) hypertension: Secondary | ICD-10-CM | POA: Diagnosis not present

## 2023-11-14 DIAGNOSIS — E119 Type 2 diabetes mellitus without complications: Secondary | ICD-10-CM | POA: Diagnosis not present

## 2023-11-14 DIAGNOSIS — E785 Hyperlipidemia, unspecified: Secondary | ICD-10-CM | POA: Diagnosis not present

## 2023-11-14 DIAGNOSIS — R0789 Other chest pain: Secondary | ICD-10-CM | POA: Diagnosis not present

## 2023-11-14 DIAGNOSIS — K219 Gastro-esophageal reflux disease without esophagitis: Secondary | ICD-10-CM | POA: Diagnosis not present

## 2023-11-14 DIAGNOSIS — R002 Palpitations: Secondary | ICD-10-CM | POA: Diagnosis not present

## 2023-11-14 DIAGNOSIS — E876 Hypokalemia: Secondary | ICD-10-CM | POA: Diagnosis not present

## 2023-11-15 ENCOUNTER — Other Ambulatory Visit: Payer: Self-pay

## 2023-11-15 DIAGNOSIS — G43709 Chronic migraine without aura, not intractable, without status migrainosus: Secondary | ICD-10-CM

## 2023-11-15 NOTE — Telephone Encounter (Signed)
 This is a controlled substance. Will need to wait for her PCP.

## 2023-11-15 NOTE — Telephone Encounter (Signed)
 Copied from CRM [id number]. Topic: Clinical - Prescription Issue >> Nov 13, 2023  4:59 PM Selinda RAMAN wrote: Reason for CRM: The patient called stating her pharmacy told her that her butalbital -acetaminophen -caffeine  (FIORICET) 50-325-40 MG tablet was not refilled stating refill not appropriate. I am not sure why this is and I contacted [place] nd she looked and she is not sure why it states this either. I told the patient someone will get back to her as soon as possible She is out of the medication now and needs this as she travels a lot. Please assist patient further.

## 2023-11-15 NOTE — Telephone Encounter (Signed)
 Patient called to check on request, advised awaiting provider approval. Patient upset as she had called in on 08/27. Requesting for rx to be sent in as soon as possible.

## 2023-11-15 NOTE — Telephone Encounter (Signed)
 Routing to providers in office to advise. Patient is out of medication and provider is off this PM. Last prescription sent 10/11/23.

## 2023-11-19 DIAGNOSIS — M5459 Other low back pain: Secondary | ICD-10-CM | POA: Diagnosis not present

## 2023-11-19 MED ORDER — BUTALBITAL-APAP-CAFFEINE 50-325-40 MG PO TABS: ORAL_TABLET | ORAL | 0 refills | Status: DC

## 2023-11-26 ENCOUNTER — Other Ambulatory Visit: Payer: Self-pay | Admitting: Nurse Practitioner

## 2023-11-26 NOTE — Telephone Encounter (Signed)
 Prescription Request  11/26/2023  LOV: 07/30/2023  What is the name of the medication or equipment? albuterol  (VENTOLIN  HFA) 108 (90 Base) MCG/ACT inhaler   Have you contacted your pharmacy to request a refill? No   Which pharmacy would you like this sent to?     [id number] [address], [place] - [address] [place] [address] Phone: [phone] Fax: [phone]  Patient notified that their request is being sent to the clinical staff for review and that they should receive a response within 2 business days.   Please advise at Mobile [phone] (mobile)

## 2023-11-27 MED ORDER — ALBUTEROL SULFATE HFA 108 (90 BASE) MCG/ACT IN AERS: 2.0000 | INHALATION_SPRAY | Freq: Four times a day (QID) | RESPIRATORY_TRACT | 0 refills | Status: AC | PRN

## 2023-12-03 DIAGNOSIS — I152 Hypertension secondary to endocrine disorders: Secondary | ICD-10-CM | POA: Diagnosis not present

## 2023-12-03 DIAGNOSIS — E1169 Type 2 diabetes mellitus with other specified complication: Secondary | ICD-10-CM | POA: Diagnosis not present

## 2023-12-03 DIAGNOSIS — E785 Hyperlipidemia, unspecified: Secondary | ICD-10-CM | POA: Diagnosis not present

## 2023-12-03 DIAGNOSIS — E1165 Type 2 diabetes mellitus with hyperglycemia: Secondary | ICD-10-CM | POA: Diagnosis not present

## 2023-12-03 DIAGNOSIS — E1159 Type 2 diabetes mellitus with other circulatory complications: Secondary | ICD-10-CM | POA: Diagnosis not present

## 2024-01-11 ENCOUNTER — Other Ambulatory Visit: Payer: Self-pay | Admitting: Nurse Practitioner

## 2024-01-11 DIAGNOSIS — G43709 Chronic migraine without aura, not intractable, without status migrainosus: Secondary | ICD-10-CM

## 2024-01-13 NOTE — Telephone Encounter (Signed)
 Copied from CRM [id number]. Topic: Clinical - Medication Question >> Jan 13, 2024 11:41 AM Myrick T wrote: Reason for CRM: patient called stated she would be going out of town on tomorrow and needed the butalbital -acetaminophen -caffeine  (FIORICET) 50-325-40 MG tablet [facility] [address]

## 2024-01-30 ENCOUNTER — Ambulatory Visit: Admitting: Nurse Practitioner

## 2024-02-09 ENCOUNTER — Emergency Department
Admission: EM | Admit: 2024-02-09 | Discharge: 2024-02-09 | Disposition: A | Discharge status: Home / Self Care | Attending: Emergency Medicine | Admitting: Emergency Medicine

## 2024-02-09 ENCOUNTER — Emergency Department

## 2024-02-09 ENCOUNTER — Other Ambulatory Visit: Payer: Self-pay

## 2024-02-09 DIAGNOSIS — J45909 Unspecified asthma, uncomplicated: Secondary | ICD-10-CM | POA: Insufficient documentation

## 2024-02-09 DIAGNOSIS — R300 Dysuria: Secondary | ICD-10-CM | POA: Insufficient documentation

## 2024-02-09 DIAGNOSIS — M545 Low back pain, unspecified: Secondary | ICD-10-CM | POA: Insufficient documentation

## 2024-02-09 DIAGNOSIS — E119 Type 2 diabetes mellitus without complications: Secondary | ICD-10-CM | POA: Insufficient documentation

## 2024-02-09 DIAGNOSIS — K5730 Diverticulosis of large intestine without perforation or abscess without bleeding: Secondary | ICD-10-CM | POA: Diagnosis not present

## 2024-02-09 DIAGNOSIS — K8020 Calculus of gallbladder without cholecystitis without obstruction: Secondary | ICD-10-CM | POA: Diagnosis not present

## 2024-02-09 DIAGNOSIS — R109 Unspecified abdominal pain: Secondary | ICD-10-CM | POA: Diagnosis not present

## 2024-02-09 DIAGNOSIS — R519 Headache, unspecified: Secondary | ICD-10-CM | POA: Diagnosis not present

## 2024-02-09 DIAGNOSIS — D3501 Benign neoplasm of right adrenal gland: Secondary | ICD-10-CM | POA: Diagnosis not present

## 2024-02-09 DIAGNOSIS — I10 Essential (primary) hypertension: Secondary | ICD-10-CM | POA: Diagnosis not present

## 2024-02-09 DIAGNOSIS — M5459 Other low back pain: Secondary | ICD-10-CM | POA: Diagnosis not present

## 2024-02-09 LAB — URINALYSIS, ROUTINE W REFLEX MICROSCOPIC
Bacteria, UA: NONE SEEN
Bilirubin Urine: NEGATIVE
Glucose, UA: NEGATIVE mg/dL
Hgb urine dipstick: NEGATIVE
Ketones, ur: NEGATIVE mg/dL
Leukocytes,Ua: NEGATIVE
Nitrite: NEGATIVE
Protein, ur: 30 mg/dL — AB
Specific Gravity, Urine: 1.017 (ref 1.005–1.030)
pH: 5 (ref 5.0–8.0)

## 2024-02-09 MED ORDER — DEXAMETHASONE SOD PHOSPHATE PF 10 MG/ML IJ SOLN
10.0000 mg | Freq: Once | INTRAMUSCULAR | Status: AC
  Administered 2024-02-09: 10 mg via INTRAMUSCULAR

## 2024-02-09 MED ORDER — HYDROCODONE-ACETAMINOPHEN 5-325 MG PO TABS: 1.0000 | ORAL_TABLET | ORAL | 0 refills | Status: DC | PRN

## 2024-02-09 MED ORDER — OXYCODONE-ACETAMINOPHEN 5-325 MG PO TABS
1.0000 | ORAL_TABLET | Freq: Once | ORAL | Status: AC
  Administered 2024-02-09: 1 via ORAL
  Filled 2024-02-09: qty 1

## 2024-02-09 MED ORDER — LIDOCAINE 5 % EX PTCH: 1.0000 | MEDICATED_PATCH | CUTANEOUS | 0 refills | Status: AC

## 2024-02-09 MED ORDER — METOCLOPRAMIDE HCL 10 MG PO TABS
10.0000 mg | ORAL_TABLET | Freq: Once | ORAL | Status: AC
  Administered 2024-02-09: 10 mg via ORAL
  Filled 2024-02-09: qty 1

## 2024-02-09 NOTE — ED Triage Notes (Addendum)
 Pt comes with left lower back pain for 3-4 days. Pt states occ burning with urination. Pt states no known injuries.   Pt stats headache that started today and 10/10 pain. Pt has hx of migraines.

## 2024-02-09 NOTE — Discharge Instructions (Addendum)
 Please take your pain medication as needed but only as prescribed.  Do not drink alcohol or drive while taking pain medication.  Return to the emergency department for any worsening pain or any other symptom personally concerning to yourself otherwise please follow-up with your doctor.

## 2024-02-09 NOTE — ED Provider Notes (Signed)
 [facility] Provider Note    Event Date/Time   First MD Initiated Contact with Patient 02/09/24 1133     (approximate)  History   Chief Complaint: Back Pain  HPI  Lacey Schaefer is a 63 y.o. female with a past medical history of asthma, diabetes, hypertension presents to the emergency department for left lower back pain for the last 3 to 4 days also has noted a slight discomfort when she urinates.  Patient also states a history of migraine headaches and states she has been experiencing a headache today as well.  Patient describes the pain is mostly in the left lower back she points to her SI joint where the patient has pain especially with any twisting, but also states discomfort when not moving.  Denies any numbness or weakness.  No fever.  Physical Exam   Triage Vital Signs: ED Triage Vitals  Encounter Vitals Group     BP 02/09/24 1102 (!) 161/86     Girls Systolic BP Percentile --      Girls Diastolic BP Percentile --      Boys Systolic BP Percentile --      Boys Diastolic BP Percentile --      Pulse Rate 02/09/24 1102 92     Resp 02/09/24 1102 18     Temp 02/09/24 1102 98.2 F (36.8 C)     Temp src --      SpO2 02/09/24 1102 100 %     Weight 02/09/24 1100 175 lb (79.4 kg)     Height 02/09/24 1100 5' 4 (1.626 m)     Head Circumference --      Peak Flow --      Pain Score 02/09/24 1100 2     Pain Loc --      Pain Education --      Exclude from Growth Chart --     Most recent vital signs: Vitals:   02/09/24 1102  BP: (!) 161/86  Pulse: 92  Resp: 18  Temp: 98.2 F (36.8 C)  SpO2: 100%    General: Awake, no distress.  CV:  Good peripheral perfusion.  Regular rate and rhythm  Resp:  Normal effort.  Equal breath sounds bilaterally.  Abd:  No distention.  Soft, nontender.  No rebound or guarding. Other:  Moderate tenderness over the left SI joint otherwise largely benign exam.   ED Results / Procedures / Treatments   RADIOLOGY  I  have reviewed interpreted CT images.  No obvious kidney stone or significant abnormality or blockage seen on my evaluation. Radiology has read the CT scan as negative.   MEDICATIONS ORDERED IN ED: Medications  dexamethasone  (DECADRON ) injection 10 mg (10 mg Intramuscular Given 02/09/24 1201)  oxyCODONE -acetaminophen  (PERCOCET/ROXICET) 5-325 MG per tablet 1 tablet (1 tablet Oral Given 02/09/24 1159)  metoCLOPramide  (REGLAN ) tablet 10 mg (10 mg Oral Given 02/09/24 1159)     IMPRESSION / MDM / ASSESSMENT AND PLAN / ED COURSE  I reviewed the triage vital signs and the nursing notes.  Patient's presentation is most consistent with acute presentation with potential threat to life or bodily function.  Patient presents emergency department for left lower back pain pain appears to be located mostly over the patient's left SI joint.  Patient did states she noted some dysuria yesterday, her urinalysis today is reassuring no red cells white cells or bacteria.  However given the patient's left-sided pain we will proceed with a CT scan to rule out  kidney stone.  CT scan read as negative.  Highly suspect SI joint pain.  We will dose a one-time dose of Decadron  in the emergency department.  We will discharge her with Lidoderm  patch and a short course of pain medication.  Patient agreeable to plan of care.  She will follow-up with her doctor.  FINAL CLINICAL IMPRESSION(S) / ED DIAGNOSES   Lower back pain   Note:  This document was prepared using Dragon voice recognition software and may include unintentional dictation errors.   Dorothyann Drivers, MD 02/09/24 1313

## 2024-02-19 ENCOUNTER — Other Ambulatory Visit: Payer: Self-pay | Admitting: Nurse Practitioner

## 2024-02-20 ENCOUNTER — Ambulatory Visit: Admitting: Nurse Practitioner

## 2024-02-21 NOTE — Telephone Encounter (Signed)
 Requested Prescriptions  Pending Prescriptions Disp Refills   carvedilol  (COREG ) 3.125 MG tablet [Pharmacy Med Name: CARVEDILOL  3.125 MG TABLET] 180 tablet 1    Sig: TAKE 1 TABLET BY MOUTH TWICE A DAY WITH FOOD     Cardiovascular: Beta Blockers 3 Failed - 02/21/2024  2:18 PM      Failed - AST in normal range and within 360 days    AST  Date Value Ref Range Status  08/19/2023 11 (L) 15 - 41 U/L Final         Failed - Last BP in normal range    BP Readings from Last 1 Encounters:  02/09/24 (!) 172/98         Failed - Valid encounter within last 6 months    Recent Outpatient Visits           3 months ago Heart palpitations   [place] [facility] [place], Mel[place] T, NP   6 months ago Type 2 diabetes mellitus with hyperglycemia, without long-term current use of insulin (HCC)   [place] [facility] Melvin Pao, NP              Passed - Cr in normal range and within 360 days    Creatinine, Ser  Date Value Ref Range Status  08/19/2023 0.87 0.44 - 1.00 mg/dL Final         Passed - ALT in normal range and within 360 days    ALT  Date Value Ref Range Status  08/19/2023 16 0 - 44 U/L Final         Passed - Last Heart Rate in normal range    Pulse Readings from Last 1 Encounters:  02/09/24 92

## 2024-02-25 ENCOUNTER — Inpatient Hospital Stay: Admitting: Nurse Practitioner

## 2024-03-04 DIAGNOSIS — M65312 Trigger thumb, left thumb: Secondary | ICD-10-CM | POA: Diagnosis not present

## 2024-03-04 DIAGNOSIS — M79645 Pain in left finger(s): Secondary | ICD-10-CM | POA: Diagnosis not present

## 2024-03-25 NOTE — Therapy (Signed)
 " OUTPATIENT OCCUPATIONAL THERAPY ORTHO EVALUATION  Patient Name: Lacey Schaefer MRN: [id number] DOB:[birth year], 64 y.o., female Today's Date: 03/26/2024  PCP: Darice Petty, NP REFERRING PROVIDER: Ezra Rattler, MD  END OF SESSION:  OT End of Session - 03/26/24 1001     Visit Number 1    Number of Visits 12    Date for Recertification  06/18/24    OT Start Time 1000    OT Stop Time 1045    OT Time Calculation (min) 45 min    Activity Tolerance Patient tolerated treatment well    Behavior During Therapy [facility] for tasks assessed/performed          Past Medical History:  Diagnosis Date   Asthma    Diabetes mellitus without complication (HCC)    Dyspnea    Hypertension    Past Surgical History:  Procedure Laterality Date   COLONOSCOPY     COLONOSCOPY WITH PROPOFOL  N/A 04/18/2022   Procedure: COLONOSCOPY WITH PROPOFOL ;  Surgeon: Therisa Bi, MD;  Location: [facility] END[facility]COPY;  Service: Gastroenterology;  Laterality: N/A;   CYSTECTOMY Left    shoulder   TOTAL ABDOMINAL HYSTERECTOMY     Patient Active Problem List   Diagnosis Date Noted   Diabetes mellitus treated with injections of non-insulin medication (HCC) 07/31/2023   Adenomatous polyp of colon 04/18/2022   Hypokalemia 03/15/2022   Obesity, Class I, BMI 30-34.9 03/15/2022   Encounter for screening colonoscopy 04/13/2021   GERD (gastroesophageal reflux disease) 04/13/2021   Intermittent constipation 04/13/2021   Chronic migraine without aura without status migrainosus, not intractable 02/21/2021   Heart palpitations 08/24/2019   Type 2 diabetes mellitus with hyperglycemia (HCC) 08/22/2018   Hypertension associated with diabetes (HCC) 08/22/2018   Hyperlipidemia associated with type 2 diabetes mellitus (HCC) 08/22/2018   Chest pain 08/18/2018   Chronic right shoulder pain 10/24/2016   Hand pain 08/12/2015    ONSET DATE: 03/02/24  REFERRING DIAG: Trigger Finger  THERAPY DIAG:  Trigger finger of left  thumb  Muscle weakness (generalized)  Rationale for Evaluation and Treatment: Rehabilitation  SUBJECTIVE:   SUBJECTIVE STATEMENT: I have not gotten the Voltaren gel yet. I trigger a couple times every morning.  Pt accompanied by: self  PERTINENT HISTORY:   03/23/24 MD Dalton viist Trigger finger of the left thumb - Corticosteroid injection recommended but patient is reluctant to try this at this point - Referred to hand therapy for symptom management. - Advised continuation of thumb brace until therapy evaluation; therapist may provide custom splint if needed. - Discussed topical Voltaren gel for soreness, limited efficacy for triggering. - Explained possible need for steroid injection if symptoms persist post-therapy. - Advised follow-up in 6-8 weeks if no improvement for further evaluation and possible steroid injection.  PRECAUTIONS: None  RED FLAGS: None   WEIGHT BEARING RESTRICTIONS: No  PAIN:  Are you having pain? Yes: NPRS scale: 5/10  FALLS: Has patient fallen in last 6 months? No  LIVING ENVIRONMENT: Lives with: lives with their family  PLOF: Independent  PATIENT GOALS: to avoid getting cortisone shot   NEXT MD VISIT: 6-8 week f/up  OBJECTIVE:  Note: Objective measures were completed at Evaluation unless otherwise noted.  HAND DOMINANCE: Right  ADLs: Overall ADLs: difficulty chopping vegetables, buckling seatbelt, can opener, sewing/crochet  FUNCTIONAL OUTCOME MEASURES: PRWHE to complete next session  UPPER EXTREMITY ROM:     Active ROM Right eval Left eval  Shoulder flexion    Shoulder abduction    Shoulder adduction  Shoulder extension    Shoulder internal rotation    Shoulder external rotation    Elbow flexion    Elbow extension    Wrist flexion 85 60  Wrist extension 60 50  Wrist ulnar deviation 35 35  Wrist radial deviation 30 25  Wrist pronation    Wrist supination    (Blank rows = not tested)  Active ROM Right eval  Left eval  Thumb MCP (0-60)  55  Thumb IP (0-80)  60 (passive)  Thumb Radial abd/add (0-55) 50 45  Thumb Palmar abd/add (0-45) 45 35 (pain)   Thumb Opposition to Small Finger  4th digit   Index MCP (0-90)     Index PIP (0-100)     Index DIP (0-70)      Long MCP (0-90)      Long PIP (0-100)      Long DIP (0-70)      Ring MCP (0-90)      Ring PIP (0-100)      Ring DIP (0-70)      Little MCP (0-90)      Little PIP (0-100)      Little DIP (0-70)      (Blank rows = not tested)   UPPER EXTREMITY MMT:     MMT Right eval Left eval  Shoulder flexion    Shoulder abduction    Shoulder adduction    Shoulder extension    Shoulder internal rotation    Shoulder external rotation    Middle trapezius    Lower trapezius    Elbow flexion    Elbow extension    Wrist flexion    Wrist extension    Wrist ulnar deviation    Wrist radial deviation    Wrist pronation    Wrist supination    (Blank rows = not tested)  HAND FUNCTION: Grip strength: Right: 60 lbs; Left: 30 lbs, Lateral pinch: Right: 15 lbs, Left: 8 lbs, and 3 point pinch: Right: 13 lbs, Left: 6 lbs  COORDINATION: TBD  SENSATION: Intermittent tingling  EDEMA: mild swelling at thumb IP joint  COGNITION: Overall cognitive status: Within functional limits for tasks assessed  OBSERVATIONS: arrives with prefab thumb spica   TREATMENT DATE: 03/26/24                                                                                                                            Educated on avoiding triggering activities (holding phone in L hand, clicking remote, sustained gripping).  Use of bandaid as MCP block to prevent flexion - wear at night time and during heavy activities  HEP 2 sets daily x 10 reps each -Wrist flexion/extension seated over arm rest -Forearm supination/pronation (cues to keep elbow at side) -Prayer stretch -Radial/ulnar deviation on tabletop or in prayer stretch - PROM thumb IP flexion and MCP  flexion - Thumb radial / palmar adduction - opposition to 2nd and 3rd digits only     PATIENT EDUCATION:  Education details: findings of eval and HEP  Person educated: Patient Education method: Explanation, Demonstration, Tactile cues, Verbal cues, and Handouts Education comprehension: verbalized understanding, returned demonstration, verbal cues required, and needs further education  HOME EXERCISE PROGRAM: Contrast bath 2 sets daily x 10 reps each -Wrist flexion/extension seated over arm rest -Forearm supination/pronation (cues to keep elbow at side) -Prayer stretch -Radial/ulnar deviation on tabletop or in prayer stretch - PROM thumb IP flexion and MCP flexion - Thumb radial / palmar adduction - opposition to 2nd and 3rd digits only   GOALS: Goals reviewed with patient? Yes   SHORT TERM GOALS: Target date: 4 weeks   Patient to verbalize 3 modifications and joint protection to decrease triggering of L thumb Baseline: No knowledge of triggering with composite flexion during the day Goal status: INITIAL   2.  Patient to be independent in wearing band-aid to block IP flexion to decrease triggering in the L thumb Baseline:  Patient reports multiple left thumb. Goal status: INITIAL      LONG TERM GOALS: Target date: 12 weeks   L thumb AROM improved for patient to show thumb ROM WFL to touch palm symptom-free Baseline: IP flexion 60* passive, radial abduction 45*, palmar abduction 35* Goal status: INITIAL   2.  L 3-point and lateral pinch improved 5 lbs for patient to retrieve objects out of palm and carry plate symptom-free Baseline:  lateral pinch R 15#,  L 8# . 3-point pinch R 13#, L 6#  Goal status: INITIAL   3.  L grip strength improved by 10 pounds for patient to carry a gallon symptom-free Baseline: left 30 lb Goal status: INITIAL   ASSESSMENT:  CLINICAL IMPRESSION: Patient seen today for occupational therapy evaluation for L thumb trigger finger. Pt reports  goal to avoid needing cortisone shot. Patient arrived wearing thumb spica splint, presents with decreased L wrist flexion/extension and radial deviation. Significantly impaired thumb IP flexion (achieves 60* passively, unable to bend actively). Decreased grip strength in L hand (30 lbs) and prehension strength (lateral pinch 8 lb, 3 pt pinch 6 lb) limiting functional use of non dominant L hand in ADLs and IADLs. Educated on discontinuing prefab brace, trial use of band-aid at nighttime and with activities to prevent IP flexion. Issued HEP with good return demonstration. Patient can benefit from skilled OT services to decrease scar tissue and stiffness increase motion and strength to return to prior level of function.  Patient can also benefit from education and joint protection and modifications to prevent and decrease trigger finger.   PERFORMANCE DEFICITS: in functional skills including ADLs, IADLs, ROM, strength, pain, flexibility, decreased knowledge of use of DME, and UE functional use,   and psychosocial skills including environmental adaptation and routines and behaviors.   IMPAIRMENTS: are limiting patient from ADLs, IADLs, rest and sleep, play, leisure, and social participation.   COMORBIDITIES: has no other co-morbidities that affects occupational performance. Patient will benefit from skilled OT to address above impairments and improve overall function.  MODIFICATION OR ASSISTANCE TO COMPLETE EVALUATION: No modification of tasks or assist necessary to complete an evaluation.  OT OCCUPATIONAL PROFILE AND HISTORY: Problem focused assessment: Including review of records relating to presenting problem.  CLINICAL DECISION MAKING: LOW - limited treatment options, no task modification necessary  REHAB POTENTIAL: Good for goals  EVALUATION COMPLEXITY: Low        PLAN:  OT FREQUENCY: 1-2x/week  OT DURATION: 12 weeks  PLANNED INTERVENTIONS: 97168 OT Re-evaluation, 97535 self care/ADL  training,  97110 therapeutic exercise, 97140 manual therapy, 97018 paraffin, 02960 fluidotherapy, 97010 moist heat, 97034 contrast bath, and 97033 iontophoresis  RECOMMENDED OTHER SERVICES: none  CONSULTED AND AGREED WITH PLAN OF CARE: Patient  PLAN FOR NEXT SESSION: ionto   Lacey Schaefer, M.S. OTR/L  03/26/2024, 11:04 AM  ascom [phone]  Lacey JINNY Schaefer, OT 03/26/2024, 11:04 AM   "

## 2024-03-26 ENCOUNTER — Ambulatory Visit: Admitting: Occupational Therapy

## 2024-03-26 DIAGNOSIS — M6281 Muscle weakness (generalized): Secondary | ICD-10-CM | POA: Insufficient documentation

## 2024-03-26 DIAGNOSIS — M65312 Trigger thumb, left thumb: Secondary | ICD-10-CM | POA: Insufficient documentation

## 2024-03-30 ENCOUNTER — Ambulatory Visit: Admitting: Occupational Therapy

## 2024-03-30 DIAGNOSIS — M65312 Trigger thumb, left thumb: Secondary | ICD-10-CM

## 2024-03-30 DIAGNOSIS — M6281 Muscle weakness (generalized): Secondary | ICD-10-CM

## 2024-03-30 NOTE — Therapy (Signed)
 " OUTPATIENT OCCUPATIONAL THERAPY ORTHO TREATMENT  Patient Name: Lacey Schaefer MRN: [id number] DOB:1961/01/30, 64 y.o., female Today's Date: 03/30/2024  PCP: Darice Petty, NP REFERRING PROVIDER: Ezra Rattler, MD  END OF SESSION:  OT End of Session - 03/30/24 1606     Visit Number 2    Number of Visits 12    Date for Recertification  06/18/24    OT Start Time 1603    Activity Tolerance Patient tolerated treatment well    Behavior During Therapy [facility] for tasks assessed/performed          Past Medical History:  Diagnosis Date   Asthma    Diabetes mellitus without complication (HCC)    Dyspnea    Hypertension    Past Surgical History:  Procedure Laterality Date   COLONOSCOPY     COLONOSCOPY WITH PROPOFOL  N/A 04/18/2022   Procedure: COLONOSCOPY WITH PROPOFOL ;  Surgeon: Therisa Bi, MD;  Location: [facility] ENDOSCOPY;  Service: Gastroenterology;  Laterality: N/A;   CYSTECTOMY Left    shoulder   TOTAL ABDOMINAL HYSTERECTOMY     Patient Active Problem List   Diagnosis Date Noted   Diabetes mellitus treated with injections of non-insulin medication (HCC) 07/31/2023   Adenomatous polyp of colon 04/18/2022   Hypokalemia 03/15/2022   Obesity, Class I, BMI 30-34.9 03/15/2022   Encounter for screening colonoscopy 04/13/2021   GERD (gastroesophageal reflux disease) 04/13/2021   Intermittent constipation 04/13/2021   Chronic migraine without aura without status migrainosus, not intractable 02/21/2021   Heart palpitations 08/24/2019   Type 2 diabetes mellitus with hyperglycemia (HCC) 08/22/2018   Hypertension associated with diabetes (HCC) 08/22/2018   Hyperlipidemia associated with type 2 diabetes mellitus (HCC) 08/22/2018   Chest pain 08/18/2018   Chronic right shoulder pain 10/24/2016   Hand pain 08/12/2015    ONSET DATE: 03/02/24  REFERRING DIAG: Trigger Finger  THERAPY DIAG:  Trigger finger of left thumb  Muscle weakness (generalized)  Rationale for Evaluation and  Treatment: Rehabilitation  SUBJECTIVE:   SUBJECTIVE STATEMENT: I did get the Voltaren but I can't get it open  -tenderness is better - Bandaid I slept with and during day wearing it some - but my finger is better  Pt accompanied by: self  PERTINENT HISTORY:   03/23/24 MD Dalton viist Trigger finger of the left thumb - Corticosteroid injection recommended but patient is reluctant to try this at this point - Referred to hand therapy for symptom management. - Advised continuation of thumb brace until therapy evaluation; therapist may provide custom splint if needed. - Discussed topical Voltaren gel for soreness, limited efficacy for triggering. - Explained possible need for steroid injection if symptoms persist post-therapy. - Advised follow-up in 6-8 weeks if no improvement for further evaluation and possible steroid injection.  PRECAUTIONS: None  RED FLAGS: None   WEIGHT BEARING RESTRICTIONS: No  PAIN:  Are you having pain? 2/10 tenderness at A1pulley   FALLS: Has patient fallen in last 6 months? No  LIVING ENVIRONMENT: Lives with: lives with their family  PLOF: Independent  PATIENT GOALS: to avoid getting cortisone shot   NEXT MD VISIT: 6-8 week f/up  OBJECTIVE:  Note: Objective measures were completed at Evaluation unless otherwise noted.  HAND DOMINANCE: Right  ADLs: Overall ADLs: difficulty chopping vegetables, buckling seatbelt, can opener, sewing/crochet  FUNCTIONAL OUTCOME MEASURES: PRWHE to complete next session  UPPER EXTREMITY ROM:     Active ROM Right eval Left eval L 03/30/24  Shoulder flexion     Shoulder abduction  Shoulder adduction     Shoulder extension     Shoulder internal rotation     Shoulder external rotation     Elbow flexion     Elbow extension     Wrist flexion 85 60 80  Wrist extension 60 50 70  Wrist ulnar deviation 35 35 30  Wrist radial deviation 30 25 20   Wrist pronation     Wrist supination     (Blank rows = not  tested)  Active ROM Right eval Left eval L 03/30/24  Thumb MCP (0-60)  55 55  Thumb IP (0-80)  60 (passive) P 40  Thumb Radial abd/add (0-55) 50 45 55  Thumb Palmar abd/add (0-45) 45 35 (pain)  55  Thumb Opposition to Small Finger  4th digit    Index MCP (0-90)      Index PIP (0-100)      Index DIP (0-70)       Long MCP (0-90)       Long PIP (0-100)       Long DIP (0-70)       Ring MCP (0-90)       Ring PIP (0-100)       Ring DIP (0-70)       Little MCP (0-90)       Little PIP (0-100)       Little DIP (0-70)       (Blank rows = not tested)   UPPER EXTREMITY MMT:     MMT Right eval Left eval  Shoulder flexion    Shoulder abduction    Shoulder adduction    Shoulder extension    Shoulder internal rotation    Shoulder external rotation    Middle trapezius    Lower trapezius    Elbow flexion    Elbow extension    Wrist flexion    Wrist extension    Wrist ulnar deviation    Wrist radial deviation    Wrist pronation    Wrist supination    (Blank rows = not tested)  HAND FUNCTION: Grip strength: Right: 60 lbs; Left: 30 lbs, Lateral pinch: Right: 15 lbs, Left: 8 lbs, and 3 point pinch: Right: 13 lbs, Left: 6 lbs  COORDINATION: TBD  SENSATION: Intermittent tingling  EDEMA: mild swelling at thumb IP joint  COGNITION: Overall cognitive status: Within functional limits for tasks assessed  OBSERVATIONS: arrives with prefab thumb spica   TREATMENT DATE: 03/30/24                                                                                                                            Pt arrive with less pain and increase AROM thumb PA and RA- wrist increase AROM -  See flowsheet Thumb IP and MC still decrease - pt to only do PROM to thumb IP and MC - opposition to 2nd and 3rd  Contrast done 8 min - decrease pain and edema - prior to soft tissue and  ROM  Pt to do a home  Educated  again on avoiding triggering activities (holding phone in L hand, clicking  remote, sustained gripping).  Use of bandaid as thumb IP block to prevent flexion - wear at night time and during heavy activities  HEP 2 sets daily x 10 reps each after contrast  -Wrist flexion/extension seated over arm rest -Forearm supination/pronation (cues to keep elbow at side) -Prayer stretch -Radial/ulnar deviation on tabletop or in prayer stretch - PROM gentle and pain free -  thumb IP flexion and MCP flexion - Thumb radial / palmar adduction - opposition to 2nd and 3rd digits only   Iontophoresis done today with dexamethasone .  Small patch at 1.8 intensity 22 minutes Skin check done prior and afterwards.  Patient tolerated well. Patient was educated while using ionto what to expect and pre and post session  PATIENT EDUCATION: Education details: findings of eval and HEP  Person educated: Patient Education method: Explanation, Demonstration, Tactile cues, Verbal cues, and Handouts Education comprehension: verbalized understanding, returned demonstration, verbal cues required, and needs further education   GOALS: Goals reviewed with patient? Yes   SHORT TERM GOALS: Target date: 4 weeks   Patient to verbalize 3 modifications and joint protection to decrease triggering of L thumb Baseline: No knowledge of triggering with composite flexion during the day Goal status: INITIAL   2.  Patient to be independent in wearing band-aid to block IP flexion to decrease triggering in the L thumb Baseline:  Patient reports multiple left thumb. Goal status: INITIAL      LONG TERM GOALS: Target date: 12 weeks   L thumb AROM improved for patient to show thumb ROM WFL to touch palm symptom-free Baseline: IP flexion 60* passive, radial abduction 45*, palmar abduction 35* Goal status: INITIAL   2.  L 3-point and lateral pinch improved 5 lbs for patient to retrieve objects out of palm and carry plate symptom-free Baseline:  lateral pinch R 15#,  L 8# . 3-point pinch R 13#, L 6#  Goal  status: INITIAL   3.  L grip strength improved by 10 pounds for patient to carry a gallon symptom-free Baseline: left 30 lb Goal status: INITIAL   ASSESSMENT:  CLINICAL IMPRESSION: Patient seen today for occupational therapy  for L thumb trigger finger. Pt reports goal to avoid needing cortisone shot. Patient arrived wearing thumb spica splint, presents with decreased L wrist flexion/extension and radial deviation. Significantly impaired thumb IP flexion (achieves 60* passively, unable to bend actively). Decreased grip strength in L hand (30 lbs) and prehension strength (lateral pinch 8 lb, 3 pt pinch 6 lb) limiting functional use of non dominant L hand in ADLs and IADLs. Educated on discontinuing prefab brace, trial use of band-aid at nighttime and with activities to prevent IP flexion. Issued HEP with good return demonstration.  NOW patient rife with increased wrist flexion extension as well as thumb palmar abduction.  Patient with less pain with active range of motion as well as less tenderness over the A1 pulley of the thumb.  Reinforced with patient again to continue with contrast can do the ice massage or Voltaren.  Only gentle pain-free passive range of motion to the IP and MCP flexion of the thumb.  Continues to block IP flexion at nighttime and during functional use.  Initiated iontophoresis with dexamethasone  this date.  Patient can benefit from skilled OT services to decrease scar tissue and stiffness increase motion and strength to return to prior level of function.  Patient can also benefit from education and joint protection and modifications to prevent and decrease trigger finger.   PERFORMANCE DEFICITS: in functional skills including ADLs, IADLs, ROM, strength, pain, flexibility, decreased knowledge of use of DME, and UE functional use,   and psychosocial skills including environmental adaptation and routines and behaviors.   IMPAIRMENTS: are limiting patient from ADLs, IADLs, rest and  sleep, play, leisure, and social participation.   COMORBIDITIES: has no other co-morbidities that affects occupational performance. Patient will benefit from skilled OT to address above impairments and improve overall function.  MODIFICATION OR ASSISTANCE TO COMPLETE EVALUATION: No modification of tasks or assist necessary to complete an evaluation.  OT OCCUPATIONAL PROFILE AND HISTORY: Problem focused assessment: Including review of records relating to presenting problem.  CLINICAL DECISION MAKING: LOW - limited treatment options, no task modification necessary  REHAB POTENTIAL: Good for goals  EVALUATION COMPLEXITY: Low        PLAN:  OT FREQUENCY: 1-2x/week  OT DURATION: 12 weeks  PLANNED INTERVENTIONS: 97168 OT Re-evaluation, 97535 self care/ADL training, 02889 therapeutic exercise, 97140 manual therapy, 97018 paraffin, 02960 fluidotherapy, 97010 moist heat, 97034 contrast bath, and 97033 iontophoresis  RECOMMENDED OTHER SERVICES: none  CONSULTED AND AGREED WITH PLAN OF CARE: Patient  PLAN FOR NEXT SESSION: ionto     Ancel Peters, OTR/L,CLT 03/30/2024, 4:07 PM   "

## 2024-04-02 ENCOUNTER — Ambulatory Visit: Admitting: Occupational Therapy

## 2024-04-02 DIAGNOSIS — M65312 Trigger thumb, left thumb: Secondary | ICD-10-CM

## 2024-04-02 DIAGNOSIS — M6281 Muscle weakness (generalized): Secondary | ICD-10-CM

## 2024-04-02 NOTE — Therapy (Signed)
 " OUTPATIENT OCCUPATIONAL THERAPY ORTHO TREATMENT  Patient Name: Lacey Schaefer MRN: [id number] DOB:Jul 22, 1960, 64 y.o., female Today's Date: 04/02/2024  PCP: Darice Petty, NP REFERRING PROVIDER: Ezra Rattler, MD  END OF SESSION:  OT End of Session - 04/02/24 0956     Visit Number 3    Number of Visits 12    Date for Recertification  06/18/24    OT Start Time 0953    OT Stop Time 1042    OT Time Calculation (min) 49 min    Activity Tolerance Patient tolerated treatment well    Behavior During Therapy [facility] for tasks assessed/performed          Past Medical History:  Diagnosis Date   Asthma    Diabetes mellitus without complication (HCC)    Dyspnea    Hypertension    Past Surgical History:  Procedure Laterality Date   COLONOSCOPY     COLONOSCOPY WITH PROPOFOL  N/A 04/18/2022   Procedure: COLONOSCOPY WITH PROPOFOL ;  Surgeon: Therisa Bi, MD;  Location: [facility] ENDOSCOPY;  Service: Gastroenterology;  Laterality: N/A;   CYSTECTOMY Left    shoulder   TOTAL ABDOMINAL HYSTERECTOMY     Patient Active Problem List   Diagnosis Date Noted   Diabetes mellitus treated with injections of non-insulin medication (HCC) 07/31/2023   Adenomatous polyp of colon 04/18/2022   Hypokalemia 03/15/2022   Obesity, Class I, BMI 30-34.9 03/15/2022   Encounter for screening colonoscopy 04/13/2021   GERD (gastroesophageal reflux disease) 04/13/2021   Intermittent constipation 04/13/2021   Chronic migraine without aura without status migrainosus, not intractable 02/21/2021   Heart palpitations 08/24/2019   Type 2 diabetes mellitus with hyperglycemia (HCC) 08/22/2018   Hypertension associated with diabetes (HCC) 08/22/2018   Hyperlipidemia associated with type 2 diabetes mellitus (HCC) 08/22/2018   Chest pain 08/18/2018   Chronic right shoulder pain 10/24/2016   Hand pain 08/12/2015    ONSET DATE: 03/02/24  REFERRING DIAG: Trigger Finger  THERAPY DIAG:  Trigger finger of left  thumb  Muscle weakness (generalized)  Rationale for Evaluation and Treatment: Rehabilitation  SUBJECTIVE:   SUBJECTIVE STATEMENT: The pain is much better.  And the pain at work so much better than the splint.  I did start the Voltaren and I am doing the hot and cold.  And I am paying attention how I pick up and grip things. Pt accompanied by: self  PERTINENT HISTORY:   03/23/24 MD Dalton viist Trigger finger of the left thumb - Corticosteroid injection recommended but patient is reluctant to try this at this point - Referred to hand therapy for symptom management. - Advised continuation of thumb brace until therapy evaluation; therapist may provide custom splint if needed. - Discussed topical Voltaren gel for soreness, limited efficacy for triggering. - Explained possible need for steroid injection if symptoms persist post-therapy. - Advised follow-up in 6-8 weeks if no improvement for further evaluation and possible steroid injection.  PRECAUTIONS: None  RED FLAGS: None   WEIGHT BEARING RESTRICTIONS: No  PAIN:  Are you having pain? 2/10 tenderness at A1pulley -at the end of session no tenderness  FALLS: Has patient fallen in last 6 months? No  LIVING ENVIRONMENT: Lives with: lives with their family  PLOF: Independent  PATIENT GOALS: to avoid getting cortisone shot   NEXT MD VISIT: 6-8 week f/up  OBJECTIVE:  Note: Objective measures were completed at Evaluation unless otherwise noted.  HAND DOMINANCE: Right  ADLs: Overall ADLs: difficulty chopping vegetables, buckling seatbelt, can opener, sewing/crochet  FUNCTIONAL  OUTCOME MEASURES: PRWHE to complete next session  UPPER EXTREMITY ROM:     Active ROM Right eval Left eval L 03/30/24  Shoulder flexion     Shoulder abduction     Shoulder adduction     Shoulder extension     Shoulder internal rotation     Shoulder external rotation     Elbow flexion     Elbow extension     Wrist flexion 85 60 80  Wrist  extension 60 50 70  Wrist ulnar deviation 35 35 30  Wrist radial deviation 30 25 20   Wrist pronation     Wrist supination     (Blank rows = not tested)  Active ROM Right eval Left eval L 03/30/24  Thumb MCP (0-60)  55 55  Thumb IP (0-80)  60 (passive) P 40  Thumb Radial abd/add (0-55) 50 45 55  Thumb Palmar abd/add (0-45) 45 35 (pain)  55  Thumb Opposition to Small Finger  4th digit    Index MCP (0-90)      Index PIP (0-100)      Index DIP (0-70)       Long MCP (0-90)       Long PIP (0-100)       Long DIP (0-70)       Ring MCP (0-90)       Ring PIP (0-100)       Ring DIP (0-70)       Little MCP (0-90)       Little PIP (0-100)       Little DIP (0-70)       (Blank rows = not tested)   UPPER EXTREMITY MMT:     MMT Right eval Left eval  Shoulder flexion    Shoulder abduction    Shoulder adduction    Shoulder extension    Shoulder internal rotation    Shoulder external rotation    Middle trapezius    Lower trapezius    Elbow flexion    Elbow extension    Wrist flexion    Wrist extension    Wrist ulnar deviation    Wrist radial deviation    Wrist pronation    Wrist supination    (Blank rows = not tested)  HAND FUNCTION: Eval Grip strength: Right: 60 lbs; Left: 30 lbs, Lateral pinch: Right: 15 lbs, Left: 8 lbs, and 3 point pinch: Right: 13 lbs, Left: 6 lbs    COORDINATION: TBD  SENSATION: Intermittent tingling  EDEMA: mild swelling at thumb IP joint  COGNITION: Overall cognitive status: Within functional limits for tasks assessed  OBSERVATIONS: arrives with prefab thumb spica   TREATMENT DATE: 04/02/24                                                                                                                            Patient continues to show improvement in tenderness of the A1 pulley.   Continues to be limited with composite  wrist flexion as well as thumb MCP and IP flexion as well as composite.   Patient showed blocked active range of motion  of the IP.  But cannot initiate active IP flexion with opposition to 2nd or 3rd.     Contrast done 8 min - decrease pain and edema - prior to soft tissue and ROM  Pt to do a home  Done dry cupping over dorsal and radial forearm to static dry cups with passive range of motion of wrist flexion and and composite wrist flexion.  As well as Graston tool number for sweeping over forearm, tool #6 Graston doing brushing over volar thumb prior to range of motion  Educated  again on avoiding triggering activities (holding phone in L hand, clicking remote, sustained gripping).  Use of bandaid as thumb IP block to prevent flexion - wear at night time and during heavy activities  HEP 2 sets daily x 10 reps each after contrast  Continue with reviewed with patient -Wrist flexion/extension seated over arm rest -Forearm supination/pronation (cues to keep elbow at side) -Prayer stretch -Radial/ulnar deviation on tabletop or in prayer stretch Reinforced and done in clinic - PROM pain free -  thumb IP flexion and MCP flexion Followed by composite flexion this date but reinforced pain should be less than 1-2/10 with a slight pull - Thumb radial / palmar adduction Did not encourage really active thumb motion because of stiffness and tightness.-Unable to initiate any IP flexion with opposition  Ultrasound done at 3.3 MHz at 20% 1.0 intensity over A1 pulley at the end of session 3 minutes to decrease edema and pain. PATIENT EDUCATION: Education details: findings of eval and HEP  Person educated: Patient Education method: Explanation, Demonstration, Tactile cues, Verbal cues, and Handouts Education comprehension: verbalized understanding, returned demonstration, verbal cues required, and needs further education   GOALS: Goals reviewed with patient? Yes   SHORT TERM GOALS: Target date: 4 weeks   Patient to verbalize 3 modifications and joint protection to decrease triggering of L thumb Baseline: No  knowledge of triggering with composite flexion during the day Goal status: INITIAL   2.  Patient to be independent in wearing band-aid to block IP flexion to decrease triggering in the L thumb Baseline:  Patient reports multiple left thumb. Goal status: INITIAL      LONG TERM GOALS: Target date: 12 weeks   L thumb AROM improved for patient to show thumb ROM WFL to touch palm symptom-free Baseline: IP flexion 60* passive, radial abduction 45*, palmar abduction 35* Goal status: INITIAL   2.  L 3-point and lateral pinch improved 5 lbs for patient to retrieve objects out of palm and carry plate symptom-free Baseline:  lateral pinch R 15#,  L 8# . 3-point pinch R 13#, L 6#  Goal status: INITIAL   3.  L grip strength improved by 10 pounds for patient to carry a gallon symptom-free Baseline: left 30 lb Goal status: INITIAL   ASSESSMENT:  CLINICAL IMPRESSION: Patient seen today for occupational therapy  for L thumb trigger finger. Pt reports goal to avoid needing cortisone shot. Patient arrived wearing thumb spica splint, presents with decreased L wrist flexion/extension and radial deviation. Significantly impaired thumb IP flexion (achieves 60* passively, unable to bend actively). Decreased grip strength in L hand (30 lbs) and prehension strength (lateral pinch 8 lb, 3 pt pinch 6 lb) limiting functional use of non dominant L hand in ADLs and IADLs. Educated on discontinuing prefab brace, trial use of  band-aid at nighttime and with activities to prevent IP flexion. Issued HEP with good return demonstration.  NOW patient continues to show progress with pain and tenderness at the A1 pulley of left thumb.  Patient continue with wrist flexion gentle stretches as well as using -but doing contrast can do the ice massage or Voltaren.  And reinforced only after contrast pain-free passive range of motion to the IP and MCP flexion of the thumb-at this date composite flexion..  Continues to block IP flexion  at nighttime and during functional use.  Patient had no tenderness or pain at the end of session.  Patient can benefit from skilled OT services to decrease scar tissue and stiffness increase motion and strength to return to prior level of function.  Patient can also benefit from education and joint protection and modifications to prevent and decrease trigger finger.   PERFORMANCE DEFICITS: in functional skills including ADLs, IADLs, ROM, strength, pain, flexibility, decreased knowledge of use of DME, and UE functional use,   and psychosocial skills including environmental adaptation and routines and behaviors.   IMPAIRMENTS: are limiting patient from ADLs, IADLs, rest and sleep, play, leisure, and social participation.   COMORBIDITIES: has no other co-morbidities that affects occupational performance. Patient will benefit from skilled OT to address above impairments and improve overall function.  MODIFICATION OR ASSISTANCE TO COMPLETE EVALUATION: No modification of tasks or assist necessary to complete an evaluation.  OT OCCUPATIONAL PROFILE AND HISTORY: Problem focused assessment: Including review of records relating to presenting problem.  CLINICAL DECISION MAKING: LOW - limited treatment options, no task modification necessary  REHAB POTENTIAL: Good for goals  EVALUATION COMPLEXITY: Low        PLAN:  OT FREQUENCY: 1-2x/week  OT DURATION: 12 weeks  PLANNED INTERVENTIONS: 97168 OT Re-evaluation, 97535 self care/ADL training, 02889 therapeutic exercise, 97140 manual therapy, 97018 paraffin, 02960 fluidotherapy, 97010 moist heat, 97034 contrast bath, and 97033 iontophoresis  RECOMMENDED OTHER SERVICES: none  CONSULTED AND AGREED WITH PLAN OF CARE: Patient  PLAN FOR NEXT SESSION: ionto     Ancel Peters, OTR/L,CLT 04/02/2024, 10:57 AM   "

## 2024-04-06 ENCOUNTER — Other Ambulatory Visit: Payer: Self-pay | Admitting: Nurse Practitioner

## 2024-04-06 ENCOUNTER — Ambulatory Visit

## 2024-04-06 DIAGNOSIS — G43709 Chronic migraine without aura, not intractable, without status migrainosus: Secondary | ICD-10-CM

## 2024-04-06 DIAGNOSIS — I872 Venous insufficiency (chronic) (peripheral): Secondary | ICD-10-CM

## 2024-04-06 DIAGNOSIS — L2084 Intrinsic (allergic) eczema: Secondary | ICD-10-CM

## 2024-04-06 DIAGNOSIS — L810 Postinflammatory hyperpigmentation: Secondary | ICD-10-CM | POA: Diagnosis not present

## 2024-04-06 MED ORDER — TRIAMCINOLONE ACETONIDE 0.1 % EX OINT: TOPICAL_OINTMENT | CUTANEOUS | 3 refills | Status: AC

## 2024-04-06 NOTE — Progress Notes (Signed)
" °  °  Subjective   Lacey Schaefer is a 64 y.o. female who presents for the following: Rash. Patient is new patient  Today patient reports: Patient here for itchy back, arms, dry skin was seen a few years ago by Dr. Jackquline and was prescribed a cream (unsure of name). Place at right groin some times itch.  Review of Systems:    No other skin or systemic complaints except as noted in HPI or Assessment and Plan.  The following portions of the chart were reviewed this encounter and updated as appropriate: medications, allergies, medical history  Relevant Medical History:  n/a   Objective  (SKPE) Well appearing patient in no apparent distress; mood and affect are within normal limits. Examination was performed of the: Focused Exam of: Back, arms, legs    Examination notable for: upper back with PIH  Examination limited by: Undergarments, Shoes or socks , and Clothing     Assessment & Plan  (SKAP)   Eczema vs ACD/ICD of upper back - today with just PIH - Diagnosis, treatment options, prognosis, risk/ benefit, and side effects of treatment were discussed with the patient.  - Reviewed benign but chronic nature of disease. - Discussed dry skin care at length, recommended avoidance of fragrances, short showers with luke- warm water , no scrubbing, an unscented moisturizing soap (e.g. Dove sensitive skin) limited to the groin and axillae, and frequent emollient use (Eucerin, Aquaphor, Cerave, Vanicream, Vaseline). - Discussed treatment with topical steroids, non steroidal topicals, systemics (dupixent, tralokinumab, nemolizumab, rinvoq)  - Reviewed proper use of topical steroids to minimize the risk of steroid-induced skin changes.  - Also discussed appropriate dry skin care including daily warm baths with gentle soap, followed by liberal bland moisturizer application.  - A patient education handout reiterating this information was provided. - For mild areas: start triamcinolone  0.1% ointment  twice daily Discussed side effect of potent topical steroids including atrophy, dyspigmentation, striae, telangectasia, folliculitis, loss of skin pigment, hair growth, tachyphylaxis, risk of systemic absorption with missuse.   Stasis dermatitis - Informed the patient that this condition is caused by swelling in his legs and reducing this swelling is the ultimate treatment - Encouraged to elevate their legs for 1 hour three times a day - Prescription for compression stockings given to patient - May use triamcinolone  at legs if ever itchy or scaly.      Was sun protection counseling provided?: No   Level of service outlined above   Patient instructions (SKPI)   Procedures, orders, diagnosis for this visit:    There are no diagnoses linked to this encounter.  Return to clinic: Return if symptoms worsen or fail to improve.  LILLETTE Lonell ROCKFORD Wilfred, CMA, am acting as scribe for Lauraine JAYSON Kanaris, MD.  Documentation: I have reviewed the above documentation for accuracy and completeness, and I agree with the above.  Lauraine JAYSON Kanaris, MD  "

## 2024-04-06 NOTE — Patient Instructions (Addendum)
 Moisturizer: Apply a moisturizer throughout the day and after bathing.  When you moisturize after bathing, this locks in the moisture.  This can lead to softer and smoother skin.  Body moisturizers come in ointments, creams, and lotions.  If you have dry skin, we recommend the use of ointments or creams rather than lotions.  In other words, something you scoop out of a jar rather than squirted out.  Ointments and creams are thicker and thus provide better moisturization.      Moisturizers Apply a moisturizer to your skin at least once a day (even if you do not bathe).   Cool moisturizers on your skin help with itching (to accomplish this, place your moisturizers and medicated creams in the refrigerator). In general, people with dry skin need a moisturizer that is scooped and not squirted.  - Ointments (petrolatum ointment): greasy, but are the best moisturizers Vaseline, Aquaphor  - Creams (thick, white cream that comes in a jar and is scooped with your hand) Cerave, Cetaphil, Eucerin, Vanicream  - Lotions (comes in a pump) is the weakest moisturizer but is an acceptable choice for the face if you have an oily face Cetaphil, Cerave, Curel, Neutrogena, Lubriderm, Aveeno    Due to recent changes in healthcare laws, you may see results of your pathology and/or laboratory studies on MyChart before the doctors have had a chance to review them. We understand that in some cases there may be results that are confusing or concerning to you. Please understand that not all results are received at the same time and often the doctors may need to interpret multiple results in order to provide you with the best plan of care or course of treatment. Therefore, we ask that you please give us  2 business days to thoroughly review all your results before contacting the office for clarification. Should we see a critical lab result, you will be contacted sooner.   If You Need Anything After Your Visit  If you have  any questions or concerns for your doctor, please call our main line at [phone] and press option 4 to reach your doctor's medical assistant. If no one answers, please leave a voicemail as directed and we will return your call as soon as possible. Messages left after 4 pm will be answered the following business day.   You may also send us  a message via MyChart. We typically respond to MyChart messages within 1-2 business days.  For prescription refills, please ask your pharmacy to contact our office. Our fax number is [phone].  If you have an urgent issue when the clinic is closed that cannot wait until the next business day, you can page your doctor at the number below.    Please note that while we do our best to be available for urgent issues outside of office hours, we are not available 24/7.   If you have an urgent issue and are unable to reach us , you may choose to seek medical care at your doctor's office, retail clinic, urgent care center, or emergency room.  If you have a medical emergency, please immediately call 911 or go to the emergency department.  Pager Numbers  - Dr. Hester: [phone]  - Dr. Jackquline: [phone]  - Dr. Claudene: [phone]   In the event of inclement weather, please call our main line at [phone] for an update on the status of any delays or closures.  Dermatology Medication Tips: Please keep the boxes that topical medications come in in order  to help keep track of the instructions about where and how to use these. Pharmacies typically print the medication instructions only on the boxes and not directly on the medication tubes.   If your medication is too expensive, please contact our office at [phone] option 4 or send us  a message through MyChart.   We are unable to tell what your co-pay for medications will be in advance as this is different depending on your insurance coverage. However, we may be able to find a substitute  medication at lower cost or fill out paperwork to get insurance to cover a needed medication.   If a prior authorization is required to get your medication covered by your insurance company, please allow us  1-2 business days to complete this process.  Drug prices often vary depending on where the prescription is filled and some pharmacies may offer cheaper prices.  The website www.goodrx.com contains coupons for medications through different pharmacies. The prices here do not account for what the cost may be with help from insurance (it may be cheaper with your insurance), but the website can give you the price if you did not use any insurance.  - You can print the associated coupon and take it with your prescription to the pharmacy.  - You may also stop by our office during regular business hours and pick up a GoodRx coupon card.  - If you need your prescription sent electronically to a different pharmacy, notify our office through [facility] or by phone at [phone] option 4.     Si Usted Necesita Algo Despus de Su Visita  Tambin puede enviarnos un mensaje a travs de Clinical cytogeneticist. Por lo general respondemos a los mensajes de MyChart en el transcurso de 1 a 2 das hbiles.  Para renovar recetas, por favor pida a su farmacia que se ponga en contacto con nuestra oficina. Randi lakes de fax es [place] [phone].  Si tiene un asunto urgente cuando la clnica est cerrada y que no puede esperar hasta el siguiente da hbil, puede llamar/localizar a su doctor(a) al nmero que aparece a continuacin.   Por favor, tenga en cuenta que aunque hacemos todo lo posible para estar disponibles para asuntos urgentes fuera del horario de [place], no estamos disponibles las 24 horas del da, los 7 [address]  [address] de la [place].   Si tiene un problema urgente y no puede comunicarse con nosotros, puede optar por buscar atencin mdica  en el consultorio de su doctor(a), en una clnica privada, en un centro de  atencin urgente o en una sala de emergencias.  Si tiene Engineer, drilling, por favor llame inmediatamente al 911 o vaya a la sala de emergencias.  Nmeros de bper  - Dr. Hester: [phone]  - Dra. Jackquline: [phone]  - Dr. Claudene: [phone]   En caso de inclemencias del tiempo, por favor llame a landry capes principal al [phone] para una actualizacin sobre el [place] de cualquier retraso o cierre.  Consejos para la medicacin en dermatologa: Por favor, guarde las cajas en las que vienen los medicamentos de uso tpico para ayudarle a seguir las instrucciones sobre dnde y cmo usarlos. Las farmacias generalmente imprimen las instrucciones del medicamento slo en las cajas y no directamente en los tubos del [place].   Si su medicamento es muy caro, por favor, pngase en contacto con landry rieger llamando al [phone] y presione la opcin 4 o envenos un mensaje a travs de Clinical cytogeneticist.   No podemos decirle cul ser su  copago por los medicamentos por adelantado ya que esto es diferente dependiendo de la cobertura de su seguro. Sin embargo, es posible que podamos encontrar un medicamento sustituto a Audiological scientist un formulario para que el seguro cubra el medicamento que se considera necesario.   Si se requiere una autorizacin previa para que su compaa de seguros malta su medicamento, por favor permtanos de 1 a 2 das hbiles para completar este proceso.  Los precios de los medicamentos varan con frecuencia dependiendo del Environmental consultant de dnde se surte la receta y alguna farmacias pueden ofrecer precios ms baratos.  El sitio web www.goodrx.com tiene cupones para medicamentos de Health and safety inspector. Los precios aqu no tienen en cuenta lo que podra costar con la ayuda del seguro (puede ser ms barato con su seguro), pero el sitio web puede darle el precio si no utiliz Tourist information centre manager.  - Puede imprimir el cupn correspondiente y llevarlo con su receta a la  farmacia.  - Tambin puede pasar por nuestra oficina durante el horario de atencin regular y Education officer, museum una tarjeta de cupones de GoodRx.  - Si necesita que su receta se enve electrnicamente a una farmacia diferente, informe a nuestra oficina a travs de MyChart de [place] o por telfono llamando al [phone] y presione la opcin 4.

## 2024-04-07 ENCOUNTER — Encounter: Payer: Self-pay | Admitting: Occupational Therapy

## 2024-04-07 ENCOUNTER — Ambulatory Visit: Admitting: Occupational Therapy

## 2024-04-07 DIAGNOSIS — M65312 Trigger thumb, left thumb: Secondary | ICD-10-CM

## 2024-04-07 DIAGNOSIS — M6281 Muscle weakness (generalized): Secondary | ICD-10-CM

## 2024-04-07 NOTE — Therapy (Signed)
 " OUTPATIENT OCCUPATIONAL THERAPY ORTHO TREATMENT  Patient Name: Lacey Schaefer MRN: [id number] DOB:1961-03-02, 64 y.o., female Today's Date: 04/07/2024  PCP: Darice Petty, NP REFERRING PROVIDER: Ezra Rattler, MD  END OF SESSION:  OT End of Session - 04/07/24 1354     Visit Number 4    Number of Visits 12    Date for Recertification  06/18/24    OT Start Time 1400    OT Stop Time 1440    OT Time Calculation (min) 40 min    Activity Tolerance Patient tolerated treatment well    Behavior During Therapy [facility] for tasks assessed/performed          Past Medical History:  Diagnosis Date   Asthma    Diabetes mellitus without complication (HCC)    Dyspnea    Hypertension    Past Surgical History:  Procedure Laterality Date   COLONOSCOPY     COLONOSCOPY WITH PROPOFOL  N/A 04/18/2022   Procedure: COLONOSCOPY WITH PROPOFOL ;  Surgeon: Therisa Bi, MD;  Location: [facility] ENDOSCOPY;  Service: Gastroenterology;  Laterality: N/A;   CYSTECTOMY Left    shoulder   TOTAL ABDOMINAL HYSTERECTOMY     Patient Active Problem List   Diagnosis Date Noted   Diabetes mellitus treated with injections of non-insulin medication (HCC) 07/31/2023   Adenomatous polyp of colon 04/18/2022   Hypokalemia 03/15/2022   Obesity, Class I, BMI 30-34.9 03/15/2022   Encounter for screening colonoscopy 04/13/2021   GERD (gastroesophageal reflux disease) 04/13/2021   Intermittent constipation 04/13/2021   Chronic migraine without aura without status migrainosus, not intractable 02/21/2021   Heart palpitations 08/24/2019   Type 2 diabetes mellitus with hyperglycemia (HCC) 08/22/2018   Hypertension associated with diabetes (HCC) 08/22/2018   Hyperlipidemia associated with type 2 diabetes mellitus (HCC) 08/22/2018   Chest pain 08/18/2018   Chronic right shoulder pain 10/24/2016   Hand pain 08/12/2015    ONSET DATE: 03/02/24  REFERRING DIAG: Trigger Finger  THERAPY DIAG:  Trigger finger of left  thumb  Muscle weakness (generalized)  Rationale for Evaluation and Treatment: Rehabilitation  SUBJECTIVE:   SUBJECTIVE STATEMENT: I trigger once a week maybe, 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  Pt accompanied by: self  PERTINENT HISTORY:   03/23/24 MD Dalton viist Trigger finger of the left thumb - Corticosteroid injection recommended but patient is reluctant to try this at this point - Referred to hand therapy for symptom management. - Advised continuation of thumb brace until therapy evaluation; therapist may provide custom splint if needed. - Discussed topical Voltaren gel for soreness, limited efficacy for triggering. - Explained possible need for steroid injection if symptoms persist post-therapy. - Advised follow-up in 6-8 weeks if no improvement for further evaluation and possible steroid injection.  PRECAUTIONS: None  RED FLAGS: None   WEIGHT BEARING RESTRICTIONS: No  PAIN:  Are you having pain? 0/10  FALLS: Has patient fallen in last 6 months? No  LIVING ENVIRONMENT: Lives with: lives with their family  PLOF: Independent  PATIENT GOALS: to avoid getting cortisone shot   NEXT MD VISIT: 6-8 week f/up  OBJECTIVE:  Note: Objective measures were completed at Evaluation unless otherwise noted.  HAND DOMINANCE: Right  ADLs: Overall ADLs: difficulty chopping vegetables, buckling seatbelt, can opener, sewing/crochet  FUNCTIONAL OUTCOME MEASURES: PRWHE to complete next session  UPPER EXTREMITY ROM:     Active ROM Right eval Left eval L 03/30/24  Shoulder flexion     Shoulder abduction  Shoulder adduction     Shoulder extension     Shoulder internal rotation     Shoulder external rotation     Elbow flexion     Elbow extension     Wrist flexion 85 60 80  Wrist extension 60 50 70  Wrist ulnar deviation 35 35 30  Wrist radial deviation 30 25 20   Wrist pronation     Wrist supination     (Blank rows = not tested)  Active ROM  Right eval Left eval L 03/30/24 L 04/07/24  Thumb MCP (0-60)  55 55   Thumb IP (0-80)  60 (passive) P 40 P 70  Thumb Radial abd/add (0-55) 50 45 55   Thumb Palmar abd/add (0-45) 45 35 (pain)  55   Thumb Opposition to Small Finger  4th digit     Index MCP (0-90)       Index PIP (0-100)       Index DIP (0-70)        Long MCP (0-90)        Long PIP (0-100)        Long DIP (0-70)        Ring MCP (0-90)        Ring PIP (0-100)        Ring DIP (0-70)        Little MCP (0-90)        Little PIP (0-100)        Little DIP (0-70)        (Blank rows = not tested)   UPPER EXTREMITY MMT:     MMT Right eval Left eval  Shoulder flexion    Shoulder abduction    Shoulder adduction    Shoulder extension    Shoulder internal rotation    Shoulder external rotation    Middle trapezius    Lower trapezius    Elbow flexion    Elbow extension    Wrist flexion    Wrist extension    Wrist ulnar deviation    Wrist radial deviation    Wrist pronation    Wrist supination    (Blank rows = not tested)  HAND FUNCTION: Eval Grip strength: Right: 60 lbs; Left: 30 lbs, Lateral pinch: Right: 15 lbs, Left: 8 lbs, and 3 point pinch: Right: 13 lbs, Left: 6 lbs    COORDINATION: TBD  SENSATION: Intermittent tingling  EDEMA: mild swelling at thumb IP joint  COGNITION: Overall cognitive status: Within functional limits for tasks assessed  OBSERVATIONS: arrives with prefab thumb spica   TREATMENT DATE: 04/07/24                                                                                                                            Patient continues to show improvement in pain with no tenderness of the A1 pulley.   Patient showed blocked active range of motion of the IP.  Minimally initiates active IP flexion with opposition to  2nd or 3rd.   Improving passive MCP flexion, IP flexion (70*) and composite flexion (IP 65* with composite).   Tolerated Graston tool #6 for sweeping over distal  forearm, tool #6 Graston brushing over volar thumb prior to range of motion. Place and hold opposition to 2nd and 3rd digit with focus on active thumb IP flexion.   Continue HEP with review of technique. Continue to limit active thumb motion because of limited active IP flexion with opposition.  HEP 2 sets daily x 10 reps each after contrast  Continue with reviewed with patient -Wrist flexion/extension seated over arm rest -Forearm supination/pronation (cues to keep elbow at side) -Prayer stretch -Radial/ulnar deviation on tabletop or in prayer stretch Reinforced and done in clinic - PROM pain free -  thumb IP flexion and MCP flexion Followed by composite flexion this date but reinforced pain should be less than 1-2/10 with a slight pull - Thumb radial / palmar adduction    PATIENT EDUCATION: Education details: findings of eval and HEP  Person educated: Patient Education method: Explanation, Demonstration, Tactile cues, Verbal cues, and Handouts Education comprehension: verbalized understanding, returned demonstration, verbal cues required, and needs further education   GOALS: Goals reviewed with patient? Yes   SHORT TERM GOALS: Target date: 4 weeks   Patient to verbalize 3 modifications and joint protection to decrease triggering of L thumb Baseline: No knowledge of triggering with composite flexion during the day Goal status: INITIAL   2.  Patient to be independent in wearing band-aid to block IP flexion to decrease triggering in the L thumb Baseline:  Patient reports multiple left thumb. Goal status: INITIAL      LONG TERM GOALS: Target date: 12 weeks   L thumb AROM improved for patient to show thumb ROM WFL to touch palm symptom-free Baseline: IP flexion 60* passive, radial abduction 45*, palmar abduction 35* Goal status: INITIAL   2.  L 3-point and lateral pinch improved 5 lbs for patient to retrieve objects out of palm and carry plate symptom-free Baseline:   lateral pinch R 15#,  L 8# . 3-point pinch R 13#, L 6#  Goal status: INITIAL   3.  L grip strength improved by 10 pounds for patient to carry a gallon symptom-free Baseline: left 30 lb Goal status: INITIAL   ASSESSMENT:  CLINICAL IMPRESSION: Patient seen today for occupational therapy  for L thumb trigger finger. Pt reports goal to avoid needing cortisone shot. Patient arrived wearing thumb spica splint, presents with decreased L wrist flexion/extension and radial deviation. Significantly impaired thumb IP flexion (achieves 60* passively, unable to bend actively). Decreased grip strength in L hand (30 lbs) and prehension strength (lateral pinch 8 lb, 3 pt pinch 6 lb) limiting functional use of non dominant L hand in ADLs and IADLs. Educated on discontinuing prefab brace, trial use of band-aid at nighttime and with activities to prevent IP flexion. Issued HEP with good return demonstration.  NOW patient continues to show progress with no pain or tenderness at the A1 pulley of left thumb.Improving passive MCP flexion, IP flexion (70*) and composite flexion (IP 65* with composite). Minimally initiates active IP flexion with opposition to 2nd or 3rd., focus on place and hold opposition with focus on active IP flexion. Patient had no tenderness or pain at the end of session.  Patient can benefit from skilled OT services to decrease scar tissue and stiffness increase motion and strength to return to prior level of function.  Patient can also benefit from  education and joint protection and modifications to prevent and decrease trigger finger.   PERFORMANCE DEFICITS: in functional skills including ADLs, IADLs, ROM, strength, pain, flexibility, decreased knowledge of use of DME, and UE functional use,   and psychosocial skills including environmental adaptation and routines and behaviors.   IMPAIRMENTS: are limiting patient from ADLs, IADLs, rest and sleep, play, leisure, and social participation.    COMORBIDITIES: has no other co-morbidities that affects occupational performance. Patient will benefit from skilled OT to address above impairments and improve overall function.  MODIFICATION OR ASSISTANCE TO COMPLETE EVALUATION: No modification of tasks or assist necessary to complete an evaluation.  OT OCCUPATIONAL PROFILE AND HISTORY: Problem focused assessment: Including review of records relating to presenting problem.  CLINICAL DECISION MAKING: LOW - limited treatment options, no task modification necessary  REHAB POTENTIAL: Good for goals  EVALUATION COMPLEXITY: Low        PLAN:  OT FREQUENCY: 1-2x/week  OT DURATION: 12 weeks  PLANNED INTERVENTIONS: 97168 OT Re-evaluation, 97535 self care/ADL training, 02889 therapeutic exercise, 97140 manual therapy, 97018 paraffin, 02960 fluidotherapy, 97010 moist heat, 97034 contrast bath, and 97033 iontophoresis  RECOMMENDED OTHER SERVICES: none  CONSULTED AND AGREED WITH PLAN OF CARE: Patient  PLAN FOR NEXT SESSION: ionto     Pearlie Lafosse J Huldah Marin, OTR/L  04/07/2024, 2:47 PM   "

## 2024-04-07 NOTE — Telephone Encounter (Signed)
 Requested medications are due for refill today.  yes  Requested medications are on the active medications list.  yes  Last refill. 01/13/2024 #14 0 rf  Future visit scheduled.   no  Notes to clinic.  Refill not delegated.     Requested Prescriptions  Pending Prescriptions Disp Refills   butalbital -acetaminophen -caffeine  (FIORICET) 50-325-40 MG tablet [Pharmacy Med Name: BUTALB-ACETAMIN-CAFF 50-325-40] 14 tablet     Sig: TAKE 1 TABLET BY MOUTH EVERY 6 HOURS AS NEEDED FOR FOR HEADACHE     Not Delegated - Analgesics:  Non-Opioid Analgesic Combinations 2 Failed - 04/07/2024  1:26 PM      Failed - This refill cannot be delegated      Passed - Cr in normal range and within 360 days    Creatinine, Ser  Date Value Ref Range Status  08/19/2023 0.87 0.44 - 1.00 mg/dL Final         Passed - eGFR is 10 or above and within 360 days    GFR calc Af Amer  Date Value Ref Range Status  04/22/2020 106 >59 mL/min/1.73 Final    Comment:    **In accordance with recommendations from the NKF-ASN Task force,**   [facility] is in the process of updating its eGFR calculation to the   2021 CKD-EPI creatinine equation that estimates kidney function   without a race variable.    GFR, Estimated  Date Value Ref Range Status  08/19/2023 >60 >60 mL/min Final    Comment:    (NOTE) Calculated using the CKD-EPI Creatinine Equation (2021)    eGFR  Date Value Ref Range Status  07/30/2023 80 >59 mL/min/1.73 Final         Passed - Patient is not pregnant      Passed - Valid encounter within last 12 months    Recent Outpatient Visits           5 months ago Heart palpitations   [facility] [place], [place] T, NP   8 months ago Type 2 diabetes mellitus with hyperglycemia, without long-term current use of insulin [facility])   [facility] Melvin Pao, NP

## 2024-04-09 ENCOUNTER — Ambulatory Visit: Admitting: Occupational Therapy

## 2024-04-09 ENCOUNTER — Encounter: Payer: Self-pay | Admitting: Occupational Therapy

## 2024-04-09 DIAGNOSIS — M65312 Trigger thumb, left thumb: Secondary | ICD-10-CM

## 2024-04-09 DIAGNOSIS — M6281 Muscle weakness (generalized): Secondary | ICD-10-CM
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
## 2024-04-09 NOTE — Telephone Encounter (Signed)
 Copied from CRM #[id number]. Topic: Clinical - Medication Question >> Apr 08, 2024  8:41 AM Harlene ORN wrote: Reason for CRM: Patient called to be scheduled for an appointment for a refill of her medications. She is scheduled for Tuesday, 01/27 @ 8:40 with Jolene Cannady. Her PCP Dr. Melvin did not have anything until and February and wanted to be seen sooner. Please send refill for butalbital -acetaminophen -caffeine  (FIORICET) 50-325-40 MG tablet [Pharmacy Med Name: BUTALB-ACETAMIN-CAFF 50-325-40] to be [facility] on [address]

## 2024-04-09 NOTE — Therapy (Signed)
 " OUTPATIENT OCCUPATIONAL THERAPY ORTHO TREATMENT  Patient Name: Lacey Schaefer MRN: [id number] DOB:1960/04/16, 64 y.o., female Today's Date: 04/09/2024  PCP: Darice Petty, NP REFERRING PROVIDER: Ezra Rattler, MD  END OF SESSION:  OT End of Session - 04/09/24 1453     Visit Number 5    Number of Visits 12    Date for Recertification  06/18/24    OT Start Time 1450    OT Stop Time 1530    OT Time Calculation (min) 40 min    Activity Tolerance Patient tolerated treatment well    Behavior During Therapy [facility] for tasks assessed/performed          Past Medical History:  Diagnosis Date   Asthma    Diabetes mellitus without complication (HCC)    Dyspnea    Hypertension    Past Surgical History:  Procedure Laterality Date   COLONOSCOPY     COLONOSCOPY WITH PROPOFOL  N/A 04/18/2022   Procedure: COLONOSCOPY WITH PROPOFOL ;  Surgeon: Therisa Bi, MD;  Location: [facility] ENDOSCOPY;  Service: Gastroenterology;  Laterality: N/A;   CYSTECTOMY Left    shoulder   TOTAL ABDOMINAL HYSTERECTOMY     Patient Active Problem List   Diagnosis Date Noted   Diabetes mellitus treated with injections of non-insulin medication (HCC) 07/31/2023   Adenomatous polyp of colon 04/18/2022   Hypokalemia 03/15/2022   Obesity, Class I, BMI 30-34.9 03/15/2022   Encounter for screening colonoscopy 04/13/2021   GERD (gastroesophageal reflux disease) 04/13/2021   Intermittent constipation 04/13/2021   Chronic migraine without aura without status migrainosus, not intractable 02/21/2021   Heart palpitations 08/24/2019   Type 2 diabetes mellitus with hyperglycemia (HCC) 08/22/2018   Hypertension associated with diabetes (HCC) 08/22/2018   Hyperlipidemia associated with type 2 diabetes mellitus (HCC) 08/22/2018   Chest pain 08/18/2018   Chronic right shoulder pain 10/24/2016   Hand pain 08/12/2015    ONSET DATE: 03/02/24  REFERRING DIAG: Trigger Finger  THERAPY DIAG:  Trigger finger of left  thumb  Muscle weakness (generalized)  Rationale for Evaluation and Treatment: Rehabilitation  SUBJECTIVE:   SUBJECTIVE STATEMENT: I crocheted a scarf yesterday and no triggering.  Pt accompanied by: self  PERTINENT HISTORY:   03/23/24 MD Dalton viist Trigger finger of the left thumb - Corticosteroid injection recommended but patient is reluctant to try this at this point - Referred to hand therapy for symptom management. - Advised continuation of thumb brace until therapy evaluation; therapist may provide custom splint if needed. - Discussed topical Voltaren gel for soreness, limited efficacy for triggering. - Explained possible need for steroid injection if symptoms persist post-therapy. - Advised follow-up in 6-8 weeks if no improvement for further evaluation and possible steroid injection.  PRECAUTIONS: None  RED FLAGS: None   WEIGHT BEARING RESTRICTIONS: No  PAIN:  Are you having pain? 1/10  FALLS: Has patient fallen in last 6 months? No  LIVING ENVIRONMENT: Lives with: lives with their family  PLOF: Independent  PATIENT GOALS: to avoid getting cortisone shot   NEXT MD VISIT: 6-8 week f/up  OBJECTIVE:  Note: Objective measures were completed at Evaluation unless otherwise noted.  HAND DOMINANCE: Right  ADLs: Overall ADLs: difficulty chopping vegetables, buckling seatbelt, can opener, sewing/crochet  FUNCTIONAL OUTCOME MEASURES:    UPPER EXTREMITY ROM:     Active ROM Right eval Left eval L 03/30/24  Shoulder flexion     Shoulder abduction     Shoulder adduction     Shoulder extension  Shoulder internal rotation     Shoulder external rotation     Elbow flexion     Elbow extension     Wrist flexion 85 60 80  Wrist extension 60 50 70  Wrist ulnar deviation 35 35 30  Wrist radial deviation 30 25 20   Wrist pronation     Wrist supination     (Blank rows = not tested)  Active ROM Right eval Left eval L 03/30/24 L 04/07/24 L 04/09/24  Thumb  MCP (0-60)  55 55    Thumb IP (0-80)  60 (passive) P 40 P 70 10 P75  Thumb Radial abd/add (0-55) 50 45 55    Thumb Palmar abd/add (0-45) 45 35 (pain)  55    Thumb Opposition to Small Finger  4th digit      Index MCP (0-90)        Index PIP (0-100)        Index DIP (0-70)         Long MCP (0-90)         Long PIP (0-100)         Long DIP (0-70)         Ring MCP (0-90)         Ring PIP (0-100)         Ring DIP (0-70)         Little MCP (0-90)         Little PIP (0-100)         Little DIP (0-70)         (Blank rows = not tested)   UPPER EXTREMITY MMT:     MMT Right eval Left eval  Shoulder flexion    Shoulder abduction    Shoulder adduction    Shoulder extension    Shoulder internal rotation    Shoulder external rotation    Middle trapezius    Lower trapezius    Elbow flexion    Elbow extension    Wrist flexion    Wrist extension    Wrist ulnar deviation    Wrist radial deviation    Wrist pronation    Wrist supination    (Blank rows = not tested)  HAND FUNCTION: Eval Grip strength: Right: 60 lbs; Left: 30 lbs, Lateral pinch: Right: 15 lbs, Left: 8 lbs, and 3 point pinch: Right: 13 lbs, Left: 6 lbs    COORDINATION: TBD  SENSATION: Intermittent tingling  EDEMA: mild swelling at thumb IP joint  COGNITION: Overall cognitive status: Within functional limits for tasks assessed  OBSERVATIONS: arrives with prefab thumb spica   TREATMENT DATE: 04/09/24                                                                                                                            Patient continues to show improvement in pain with 1/10  tenderness of the A1 pulley.  Pt reports use of voltaren gel up to 4x/day as needed.  Patient showed blocked active range of motion of the IP.  Minimally initiates active IP flexion with opposition to 2nd or 3rd. 1 episode of triggering with place and hold  Improving passive MCP flexion, IP flexion (75*) and active IP flexion  10*  Tolerated Graston tool #2 for sweeping over forearm, tool #6 Graston brushing over volar thumb prior to range of motion. Place and hold opposition to 2nd and 3rd digit with focus on active thumb IP flexion.   Continue HEP with review of technique. Continue to limit active thumb motion because of limited active IP flexion with opposition.  HEP 2 sets daily x 10 reps each after contrast  Continue with reviewed with patient -Wrist flexion/extension seated over arm rest -Forearm supination/pronation (cues to keep elbow at side) -Prayer stretch -Radial/ulnar deviation on tabletop or in prayer stretch Reinforced and done in clinic - PROM pain free -  thumb IP flexion and MCP flexion Followed by composite flexion this date but reinforced pain should be less than 1-2/10 with a slight pull - Thumb radial / palmar adduction    PATIENT EDUCATION: Education details: findings of eval and HEP  Person educated: Patient Education method: Explanation, Demonstration, Tactile cues, Verbal cues, and Handouts Education comprehension: verbalized understanding, returned demonstration, verbal cues required, and needs further education   GOALS: Goals reviewed with patient? Yes   SHORT TERM GOALS: Target date: 4 weeks   Patient to verbalize 3 modifications and joint protection to decrease triggering of L thumb Baseline: No knowledge of triggering with composite flexion during the day Goal status: INITIAL   2.  Patient to be independent in wearing band-aid to block IP flexion to decrease triggering in the L thumb Baseline:  Patient reports multiple left thumb. Goal status: INITIAL      LONG TERM GOALS: Target date: 12 weeks   L thumb AROM improved for patient to show thumb ROM WFL to touch palm symptom-free Baseline: IP flexion 60* passive, radial abduction 45*, palmar abduction 35* Goal status: INITIAL   2.  L 3-point and lateral pinch improved 5 lbs for patient to retrieve objects out of  palm and carry plate symptom-free Baseline:  lateral pinch R 15#,  L 8# . 3-point pinch R 13#, L 6#  Goal status: INITIAL   3.  L grip strength improved by 10 pounds for patient to carry a gallon symptom-free Baseline: left 30 lb Goal status: INITIAL   ASSESSMENT:  CLINICAL IMPRESSION: Patient seen today for occupational therapy  for L thumb trigger finger. Pt reports goal to avoid needing cortisone shot. Patient arrived wearing thumb spica splint, presents with decreased L wrist flexion/extension and radial deviation. Significantly impaired thumb IP flexion (achieves 60* passively, unable to bend actively). Decreased grip strength in L hand (30 lbs) and prehension strength (lateral pinch 8 lb, 3 pt pinch 6 lb) limiting functional use of non dominant L hand in ADLs and IADLs. Educated on discontinuing prefab brace, trial use of band-aid at nighttime and with activities to prevent IP flexion. Issued HEP with good return demonstration.  NOW Patient continues to show improvement in pain with 1/10  tenderness of the A1 pulley. Continues to minimally initiate active IP flexion with opposition to 2nd or 3rd digit. 1 episode of triggering with place and hold to 4th digit. Improving passive MCP flexion, IP flexion (75*) and active IP flexion 10*. Reviewed forearm stretches/massage. Patient can benefit from skilled OT services to decrease scar tissue and stiffness increase motion and strength to  return to prior level of function.  Patient can also benefit from education and joint protection and modifications to prevent and decrease trigger finger.   PERFORMANCE DEFICITS: in functional skills including ADLs, IADLs, ROM, strength, pain, flexibility, decreased knowledge of use of DME, and UE functional use,   and psychosocial skills including environmental adaptation and routines and behaviors.   IMPAIRMENTS: are limiting patient from ADLs, IADLs, rest and sleep, play, leisure, and social participation.    COMORBIDITIES: has no other co-morbidities that affects occupational performance. Patient will benefit from skilled OT to address above impairments and improve overall function.  MODIFICATION OR ASSISTANCE TO COMPLETE EVALUATION: No modification of tasks or assist necessary to complete an evaluation.  OT OCCUPATIONAL PROFILE AND HISTORY: Problem focused assessment: Including review of records relating to presenting problem.  CLINICAL DECISION MAKING: LOW - limited treatment options, no task modification necessary  REHAB POTENTIAL: Good for goals  EVALUATION COMPLEXITY: Low        PLAN:  OT FREQUENCY: 1-2x/week  OT DURATION: 12 weeks  PLANNED INTERVENTIONS: 97168 OT Re-evaluation, 97535 self care/ADL training, 02889 therapeutic exercise, 97140 manual therapy, 97018 paraffin, 02960 fluidotherapy, 97010 moist heat, 97034 contrast bath, and 97033 iontophoresis  RECOMMENDED OTHER SERVICES: none  CONSULTED AND AGREED WITH PLAN OF CARE: Patient  PLAN FOR NEXT SESSION: ionto     Elston JINNY Slot, OTR/L  04/09/2024, 2:54 PM   "

## 2024-04-10 ENCOUNTER — Other Ambulatory Visit: Payer: Self-pay | Admitting: Nurse Practitioner

## 2024-04-10 DIAGNOSIS — G43709 Chronic migraine without aura, not intractable, without status migrainosus: Secondary | ICD-10-CM

## 2024-04-10 NOTE — Telephone Encounter (Signed)
 Requested medications are due for refill today.  yes   Requested medications are on the active medications list.  yes   Last refill. 01/13/2024 #14 0 rf   Future visit scheduled.   yes   Notes to clinic.  Refill not delegated.   Requested Prescriptions  Pending Prescriptions Disp Refills   butalbital -acetaminophen -caffeine  (FIORICET) 50-325-40 MG tablet 14 tablet 0    Sig: TAKE 1 TABLET BY MOUTH EVERY 6 HOURS AS NEEDED FOR FOR HEADACHE     Not Delegated - Analgesics:  Non-Opioid Analgesic Combinations 2 Failed - 04/10/2024  2:44 PM      Failed - This refill cannot be delegated      Passed - Cr in normal range and within 360 days    Creatinine, Ser  Date Value Ref Range Status  08/19/2023 0.87 0.44 - 1.00 mg/dL Final         Passed - eGFR is 10 or above and within 360 days    GFR calc Af Amer  Date Value Ref Range Status  04/22/2020 106 >59 mL/min/1.73 Final    Comment:    **In accordance with recommendations from the NKF-ASN Task force,**   [facility] is in the process of updating its eGFR calculation to the   2021 CKD-EPI creatinine equation that estimates kidney function   without a race variable.    GFR, Estimated  Date Value Ref Range Status  08/19/2023 >60 >60 mL/min Final    Comment:    (NOTE) Calculated using the CKD-EPI Creatinine Equation (2021)    eGFR  Date Value Ref Range Status  07/30/2023 80 >59 mL/min/1.73 Final         Passed - Patient is not pregnant      Passed - Valid encounter within last 12 months    Recent Outpatient Visits           5 months ago Heart palpitations   [place] [facility] [place], [place] T, NP   8 months ago Type 2 diabetes mellitus with hyperglycemia, without long-term current use of insulin [facility])   [place] [facility] Melvin Pao, NP

## 2024-04-10 NOTE — Telephone Encounter (Signed)
 Requested medications are due for refill today.  yes  Requested medications are on the active medications list.  yes  Last refill. 01/13/2024 #14 0 rf  Future visit scheduled.   yes  Notes to clinic.  Refill not delegated.    Requested Prescriptions  Pending Prescriptions Disp Refills   butalbital -acetaminophen -caffeine  (FIORICET) 50-325-40 MG tablet 14 tablet 0    Sig: TAKE 1 TABLET BY MOUTH EVERY 6 HOURS AS NEEDED FOR FOR HEADACHE     Not Delegated - Analgesics:  Non-Opioid Analgesic Combinations 2 Failed - 04/10/2024  2:43 PM      Failed - This refill cannot be delegated      Passed - Cr in normal range and within 360 days    Creatinine, Ser  Date Value Ref Range Status  08/19/2023 0.87 0.44 - 1.00 mg/dL Final         Passed - eGFR is 10 or above and within 360 days    GFR calc Af Amer  Date Value Ref Range Status  04/22/2020 106 >59 mL/min/1.73 Final    Comment:    **In accordance with recommendations from the NKF-ASN Task force,**   [facility] is in the process of updating its eGFR calculation to the   2021 CKD-EPI creatinine equation that estimates kidney function   without a race variable.    GFR, Estimated  Date Value Ref Range Status  08/19/2023 >60 >60 mL/min Final    Comment:    (NOTE) Calculated using the CKD-EPI Creatinine Equation (2021)    eGFR  Date Value Ref Range Status  07/30/2023 80 >59 mL/min/1.73 Final         Passed - Patient is not pregnant      Passed - Valid encounter within last 12 months    Recent Outpatient Visits           5 months ago Heart palpitations   [place] [facility] [place], [place] T, NP   8 months ago Type 2 diabetes mellitus with hyperglycemia, without long-term current use of insulin [facility])   [place] [facility] Melvin Pao, NP

## 2024-04-10 NOTE — Telephone Encounter (Signed)
 Copied from CRM [id number]. Topic: Clinical - Medication Refill >> Apr 10, 2024  1:32 PM Zebedee SAUNDERS wrote: Medication: butalbital -acetaminophen -caffeine  (FIORICET) 50-325-40 MG tablet  Has the patient contacted their pharmacy? Yes (Agent: If no, request that the patient contact the pharmacy for the refill. If patient does not wish to contact the pharmacy document the reason why and proceed with request.) (Agent: If yes, when and what did the pharmacy advise?)  This is the patient's preferred pharmacy:  [id number] [place] [place] - [name] [place] [address] Phone: [phone] Fax: [phone]  Is this the correct pharmacy for this prescription? Yes If no, delete pharmacy and type the correct one.   Has the prescription been filled recently? Yes  Is the patient out of the medication? Yes  Has the patient been seen for an appointment in the last year OR does the patient have an upcoming appointment? Yes  Can we respond through MyChart? Yes  Agent: Please be advised that Rx refills may take up to 3 business days. We ask that you follow-up with your pharmacy. >> Apr 10, 2024  1:40 PM Hadassah PARAS wrote: Med was denied before. Pt scheduled an app and resent refill req w prior rep.

## 2024-04-10 NOTE — Telephone Encounter (Signed)
 Copied from CRM [id number]. Topic: Clinical - Medication Refill >> Apr 10, 2024  1:32 PM Zebedee SAUNDERS wrote: Medication: butalbital -acetaminophen -caffeine  (FIORICET) 50-325-40 MG tablet  Has the patient contacted their pharmacy? Yes (Agent: If no, request that the patient contact the pharmacy for the refill. If patient does not wish to contact the pharmacy document the reason why and proceed with request.) (Agent: If yes, when and what did the pharmacy advise?)  This is the patient's preferred pharmacy:  [id number] [place], [place] - [name] [place] [address] Phone: [phone] Fax: [phone]  Is this the correct pharmacy for this prescription? Yes If no, delete pharmacy and type the correct one.   Has the prescription been filled recently? Yes  Is the patient out of the medication? Yes  Has the patient been seen for an appointment in the last year OR does the patient have an upcoming appointment? Yes  Can we respond through MyChart? Yes  Agent: Please be advised that Rx refills may take up to 3 business days. We ask that you follow-up with your pharmacy. >> Apr 10, 2024  1:40 PM Hadassah PARAS wrote: Med was denied before. Pt scheduled an app and resent refill req w prior rep.

## 2024-04-11 NOTE — Patient Instructions (Incomplete)

## 2024-04-14 ENCOUNTER — Ambulatory Visit: Admitting: Nurse Practitioner

## 2024-04-14 ENCOUNTER — Ambulatory Visit: Admitting: Occupational Therapy

## 2024-04-14 DIAGNOSIS — E876 Hypokalemia: Secondary | ICD-10-CM

## 2024-04-14 DIAGNOSIS — Z23 Encounter for immunization: Secondary | ICD-10-CM

## 2024-04-14 DIAGNOSIS — E785 Hyperlipidemia, unspecified: Secondary | ICD-10-CM

## 2024-04-14 DIAGNOSIS — E1165 Type 2 diabetes mellitus with hyperglycemia: Secondary | ICD-10-CM

## 2024-04-14 DIAGNOSIS — E1159 Type 2 diabetes mellitus with other circulatory complications: Secondary | ICD-10-CM

## 2024-04-14 DIAGNOSIS — E66811 Obesity, class 1: Secondary | ICD-10-CM

## 2024-04-14 DIAGNOSIS — E119 Type 2 diabetes mellitus without complications: Secondary | ICD-10-CM

## 2024-04-16 ENCOUNTER — Encounter: Payer: Self-pay | Admitting: Occupational Therapy

## 2024-04-16 ENCOUNTER — Ambulatory Visit: Admitting: Occupational Therapy

## 2024-04-16 DIAGNOSIS — M65312 Trigger thumb, left thumb: Secondary | ICD-10-CM | POA: Diagnosis not present

## 2024-04-16 DIAGNOSIS — M6281 Muscle weakness (generalized): Secondary | ICD-10-CM

## 2024-04-16 NOTE — Therapy (Signed)
 " OUTPATIENT OCCUPATIONAL THERAPY ORTHO TREATMENT  Patient Name: Lacey Schaefer MRN: [id number] DOB:02/15/1961, 64 y.o., female Today's Date: 04/16/2024  PCP: Darice Petty, NP REFERRING PROVIDER: Ezra Rattler, MD  END OF SESSION:  OT End of Session - 04/16/24 1407     Visit Number 6    Number of Visits 12    Date for Recertification  06/18/24    OT Start Time 1407    OT Stop Time 1445    OT Time Calculation (min) 38 min    Activity Tolerance Patient tolerated treatment well    Behavior During Therapy [facility] for tasks assessed/performed          Past Medical History:  Diagnosis Date   Asthma    Diabetes mellitus without complication (HCC)    Dyspnea    Hypertension    Past Surgical History:  Procedure Laterality Date   COLONOSCOPY     COLONOSCOPY WITH PROPOFOL  N/A 04/18/2022   Procedure: COLONOSCOPY WITH PROPOFOL ;  Surgeon: Therisa Bi, MD;  Location: [facility] ENDOSCOPY;  Service: Gastroenterology;  Laterality: N/A;   CYSTECTOMY Left    shoulder   TOTAL ABDOMINAL HYSTERECTOMY     Patient Active Problem List   Diagnosis Date Noted   Diabetes mellitus treated with injections of non-insulin medication (HCC) 07/31/2023   Adenomatous polyp of colon 04/18/2022   Hypokalemia 03/15/2022   Obesity, Class I, BMI 30-34.9 03/15/2022   GERD (gastroesophageal reflux disease) 04/13/2021   Intermittent constipation 04/13/2021   Chronic migraine without aura without status migrainosus, not intractable 02/21/2021   Heart palpitations 08/24/2019   Type 2 diabetes mellitus with hyperglycemia (HCC) 08/22/2018   Hypertension associated with diabetes (HCC) 08/22/2018   Hyperlipidemia associated with type 2 diabetes mellitus (HCC) 08/22/2018   Chronic right shoulder pain 10/24/2016    ONSET DATE: 03/02/24  REFERRING DIAG: Trigger Finger  THERAPY DIAG:  Trigger finger of left thumb  Muscle weakness (generalized)  Rationale for Evaluation and Treatment:  Rehabilitation  SUBJECTIVE:   SUBJECTIVE STATEMENT: I am ready to graduate.  Pt accompanied by: self  PERTINENT HISTORY:   03/23/24 MD Dalton viist Trigger finger of the left thumb - Corticosteroid injection recommended but patient is reluctant to try this at this point - Referred to hand therapy for symptom management. - Advised continuation of thumb brace until therapy evaluation; therapist may provide custom splint if needed. - Discussed topical Voltaren gel for soreness, limited efficacy for triggering. - Explained possible need for steroid injection if symptoms persist post-therapy. - Advised follow-up in 6-8 weeks if no improvement for further evaluation and possible steroid injection.  PRECAUTIONS: None  RED FLAGS: None   WEIGHT BEARING RESTRICTIONS: No  PAIN:  Are you having pain? 0/10  FALLS: Has patient fallen in last 6 months? No  LIVING ENVIRONMENT: Lives with: lives with their family  PLOF: Independent  PATIENT GOALS: to avoid getting cortisone shot   NEXT MD VISIT: 6-8 week f/up  OBJECTIVE:  Note: Objective measures were completed at Evaluation unless otherwise noted.  HAND DOMINANCE: Right  ADLs: Overall ADLs: difficulty chopping vegetables, buckling seatbelt, can opener, sewing/crochet  FUNCTIONAL OUTCOME MEASURES:    UPPER EXTREMITY ROM:     Active ROM Right eval Left eval L 03/30/24 L 04/16/24  Shoulder flexion      Shoulder abduction      Shoulder adduction      Shoulder extension      Shoulder internal rotation      Shoulder external rotation  Elbow flexion      Elbow extension      Wrist flexion 85 60 80 85  Wrist extension 60 50 70 70  Wrist ulnar deviation 35 35 30   Wrist radial deviation 30 25 20    Wrist pronation      Wrist supination      (Blank rows = not tested)  Active ROM Right eval Left eval L 03/30/24 L 04/07/24 L 04/09/24 L 04/16/24  Thumb MCP (0-60)  55 55     Thumb IP (0-80)  60 (passive) P 40 P 70 10  P75 30 (p 80)  Thumb Radial abd/add (0-55) 50 45 55     Thumb Palmar abd/add (0-45) 45 35 (pain)  55     Thumb Opposition to Small Finger  4th digit       Index MCP (0-90)         Index PIP (0-100)         Index DIP (0-70)          Long MCP (0-90)          Long PIP (0-100)          Long DIP (0-70)          Ring MCP (0-90)          Ring PIP (0-100)          Ring DIP (0-70)          Little MCP (0-90)          Little PIP (0-100)          Little DIP (0-70)          (Blank rows = not tested)   UPPER EXTREMITY MMT:     MMT Right eval Left eval  Shoulder flexion    Shoulder abduction    Shoulder adduction    Shoulder extension    Shoulder internal rotation    Shoulder external rotation    Middle trapezius    Lower trapezius    Elbow flexion    Elbow extension    Wrist flexion    Wrist extension    Wrist ulnar deviation    Wrist radial deviation    Wrist pronation    Wrist supination    (Blank rows = not tested)  HAND FUNCTION: Eval Grip strength: Right: 60 lbs; Left: 30 lbs, Lateral pinch: Right: 15 lbs, Left: 8 lbs, and 3 point pinch: Right: 13 lbs, Left: 6 lbs 04/16/24: Grip strength: Right: 60 lbs; Left: 54 lbs, Lateral pinch: Right: 16 lbs, Left: 11 lbs, and 3 point pinch: Right: 14 lbs, Left: 6 lbs   COORDINATION: TBD  SENSATION: Intermittent tingling  EDEMA: mild swelling at thumb IP joint  COGNITION: Overall cognitive status: Within functional limits for tasks assessed  OBSERVATIONS: arrives with prefab thumb spica   TREATMENT DATE: 04/16/24  Patient continues to show improvement in pain with 0/10  tenderness of the A1 pulley.  Achieves opposition to 5th digit - unable to slide.  Improving passive MCP flexion, IP flexion (80*) and active IP flexion 30* Place and hold opposition to 2nd and 3rd digit with focus on active thumb IP  flexion.   Continue HEP with review of technique. Place and hold digit opposition to 2-4th digits with focus on making O shape for active IP flexion - achieves up 30* IP flexion.   HEP 2 sets daily x 10 reps each after contrast  Continue with reviewed with patient -Wrist flexion/extension seated over arm rest -Forearm supination/pronation (cues to keep elbow at side) -Prayer stretch -Radial/ulnar deviation on tabletop or in prayer stretch Reinforced and done in clinic - PROM pain free -  thumb IP flexion and MCP flexion Followed by composite flexion this date but reinforced pain should be less than 1-2/10 with a slight pull - Thumb radial / palmar adduction    PATIENT EDUCATION: Education details: findings of eval and HEP  Person educated: Patient Education method: Explanation, Demonstration, Tactile cues, Verbal cues, and Handouts Education comprehension: verbalized understanding, returned demonstration, verbal cues required, and needs further education   GOALS: Goals reviewed with patient? Yes   SHORT TERM GOALS: Target date: 4 weeks   Patient to verbalize 3 modifications and joint protection to decrease triggering of L thumb Baseline: No knowledge of triggering with composite flexion during the day Goal status: INITIAL   2.  Patient to be independent in wearing band-aid to block IP flexion to decrease triggering in the L thumb Baseline:  Patient reports multiple left thumb. Goal status: INITIAL      LONG TERM GOALS: Target date: 12 weeks   L thumb AROM improved for patient to show thumb ROM WFL to touch palm symptom-free Baseline: IP flexion 60* passive, radial abduction 45*, palmar abduction 35* Goal status: INITIAL   2.  L 3-point and lateral pinch improved 5 lbs for patient to retrieve objects out of palm and carry plate symptom-free Baseline:  lateral pinch R 15#,  L 8# . 3-point pinch R 13#, L 6#  Goal status: INITIAL   3.  L grip strength improved by 10  pounds for patient to carry a gallon symptom-free Baseline: left 30 lb Goal status: INITIAL   ASSESSMENT:  CLINICAL IMPRESSION: Patient seen today for occupational therapy  for L thumb trigger finger. Pt reports goal to avoid needing cortisone shot. Patient arrived wearing thumb spica splint, presents with decreased L wrist flexion/extension and radial deviation. Significantly impaired thumb IP flexion (achieves 60* passively, unable to bend actively). Decreased grip strength in L hand (30 lbs) and prehension strength (lateral pinch 8 lb, 3 pt pinch 6 lb) limiting functional use of non dominant L hand in ADLs and IADLs. Educated on discontinuing prefab brace, trial use of band-aid at nighttime and with activities to prevent IP flexion. Issued HEP with good return demonstration.  NOW Patient continues to show improvement in pain with 0/10 tenderness of the A1 pulley. Continues to improve active IP flexion 30* and achieves 5th digit opposition, completing place and hold exercise opposition to 2-4th digits. Grip strength improving 54 lbs, lateral pinch 11#. Continues to demonstrate deficits in 3 pt pinch and limited active thumb IP flexion. Plan for 2 weeks before next session to continue to assess progress. Patient can benefit from skilled OT services to decrease scar tissue and stiffness increase motion and strength to return  to prior level of function.  Patient can also benefit from education and joint protection and modifications to prevent and decrease trigger finger.   PERFORMANCE DEFICITS: in functional skills including ADLs, IADLs, ROM, strength, pain, flexibility, decreased knowledge of use of DME, and UE functional use,   and psychosocial skills including environmental adaptation and routines and behaviors.   IMPAIRMENTS: are limiting patient from ADLs, IADLs, rest and sleep, play, leisure, and social participation.   COMORBIDITIES: has no other co-morbidities that affects occupational  performance. Patient will benefit from skilled OT to address above impairments and improve overall function.  MODIFICATION OR ASSISTANCE TO COMPLETE EVALUATION: No modification of tasks or assist necessary to complete an evaluation.  OT OCCUPATIONAL PROFILE AND HISTORY: Problem focused assessment: Including review of records relating to presenting problem.  CLINICAL DECISION MAKING: LOW - limited treatment options, no task modification necessary  REHAB POTENTIAL: Good for goals  EVALUATION COMPLEXITY: Low        PLAN:  OT FREQUENCY: 1-2x/week  OT DURATION: 12 weeks  PLANNED INTERVENTIONS: 97168 OT Re-evaluation, 97535 self care/ADL training, 02889 therapeutic exercise, 97140 manual therapy, 97018 paraffin, 02960 fluidotherapy, 97010 moist heat, 97034 contrast bath, and 97033 iontophoresis  RECOMMENDED OTHER SERVICES: none  CONSULTED AND AGREED WITH PLAN OF CARE: Patient  PLAN FOR NEXT SESSION: ionto     Elston JINNY Slot, OTR/L  04/16/2024, 2:08 PM   "

## 2024-04-20 ENCOUNTER — Ambulatory Visit: Admitting: Occupational Therapy

## 2024-04-22 ENCOUNTER — Encounter: Payer: Self-pay | Admitting: Nurse Practitioner

## 2024-04-22 ENCOUNTER — Ambulatory Visit (INDEPENDENT_AMBULATORY_CARE_PROVIDER_SITE_OTHER): Admitting: Nurse Practitioner

## 2024-04-22 VITALS — BP 153/81 | HR 82 | Temp 98.0°F | Ht 64.02 in | Wt 180.2 lb

## 2024-04-22 DIAGNOSIS — E1169 Type 2 diabetes mellitus with other specified complication: Secondary | ICD-10-CM | POA: Diagnosis not present

## 2024-04-22 DIAGNOSIS — E119 Type 2 diabetes mellitus without complications: Secondary | ICD-10-CM

## 2024-04-22 DIAGNOSIS — Z7985 Long-term (current) use of injectable non-insulin antidiabetic drugs: Secondary | ICD-10-CM

## 2024-04-22 DIAGNOSIS — G43709 Chronic migraine without aura, not intractable, without status migrainosus: Secondary | ICD-10-CM

## 2024-04-22 DIAGNOSIS — Z0000 Encounter for general adult medical examination without abnormal findings: Secondary | ICD-10-CM

## 2024-04-22 DIAGNOSIS — E785 Hyperlipidemia, unspecified: Secondary | ICD-10-CM | POA: Diagnosis not present

## 2024-04-22 DIAGNOSIS — E1165 Type 2 diabetes mellitus with hyperglycemia: Secondary | ICD-10-CM | POA: Diagnosis not present

## 2024-04-22 DIAGNOSIS — I152 Hypertension secondary to endocrine disorders: Secondary | ICD-10-CM | POA: Diagnosis not present

## 2024-04-22 DIAGNOSIS — E1159 Type 2 diabetes mellitus with other circulatory complications: Secondary | ICD-10-CM

## 2024-04-22 LAB — MICROALBUMIN, URINE WAIVED
Creatinine, Urine Waived: 50 mg/dL (ref 10–300)
Microalb, Ur Waived: 150 mg/L — ABNORMAL HIGH (ref 0–19)
Microalb/Creat Ratio: >300 mg/g — ABNORMAL HIGH

## 2024-04-22 MED ORDER — BUTALBITAL-APAP-CAFFEINE 50-325-40 MG PO TABS: ORAL_TABLET | ORAL | 0 refills | Status: AC

## 2024-04-22 NOTE — Assessment & Plan Note (Signed)
 Chronic.  Controlled.  Continue with current medication regimen of PRN use of Fioricet.  Labs ordered today.  Return to clinic in 6 months for reevaluation.  Call sooner if concerns arise.

## 2024-04-22 NOTE — Progress Notes (Signed)
 "  BP (!) 153/81 (Cuff Size: Normal)   Pulse 82   Temp 98 F (36.7 C) (Oral)   Ht 5' 4.02 (1.626 m)   Wt 180 lb 3.2 oz (81.7 kg)   SpO2 98%   BMI 30.92 kg/m    Subjective:    Patient ID: Lacey Schaefer, female    DOB: May 20, 1960, 64 y.o.   MRN: [id number]  HPI: Lacey Schaefer is a 64 y.o. female presenting on 04/22/2024 for comprehensive medical examination. Current medical complaints include:none   She currently lives with: Menopausal Symptoms: no  HYPERTENSION / HYPERLIPIDEMIA She is seeing Cardiology- Dr. Florencio. Satisfied with current treatment? yes Duration of hypertension: years BP monitoring frequency: daily BP range: 120-130/70-80 BP medication side effects: no Past BP meds:  hydralazine , amlodipine , carvedilol , and HCTZ Duration of hyperlipidemia: years Cholesterol medication side effects: no Cholesterol supplements: none Past cholesterol medications: pravastatin  (pravachol ) Medication compliance: excellent compliance Aspirin: yes Recent stressors: no Recurrent headaches: no Visual changes: no Palpitations: no Dyspnea: no Chest pain: no Lower extremity edema: no Dizzy/lightheaded: no   DIABETES Followed by Endocrinology.  Last checked in January was 9.7%.  Her Glipizide  was increased which she states is helping her sugars.  She is still going back and forth between here and [place] .  Hypoglycemic episodes:no Polydipsia/polyuria: no Visual disturbance: no Chest pain: no Paresthesias: no Glucose Monitoring: no             Accucheck frequency: every other day             Fasting glucose: 128-140             Post prandial:             Evening:             Before meals: Taking Insulin?: no             Long acting insulin:             Short acting insulin: Blood Pressure Monitoring: daily Retinal Examination: Not up to Date Foot Exam: Up to Date Diabetic Education: Not Completed Pneumovax: Not up to Date Influenza: Not up to Date Aspirin:  no  MIGRAINES Patient states she is more triggered by cold weather, being hungry.  Gets them on the right side of her head.  Uses Fioricet as needed which works well for her.  Needs a refill today.    Depression Screen done today and results listed below:     04/22/2024    9:39 AM 07/30/2023    3:53 PM 02/22/2023   10:14 AM 01/23/2023   11:14 AM 10/10/2022   10:22 AM  Depression screen PHQ 2/9  Decreased Interest 0 0 0 0 0  Down, Depressed, Hopeless 0 0 0 0 0  PHQ - 2 Score 0 0 0 0 0  Altered sleeping 0 0 0 0 0  Tired, decreased energy 0 0 0 0 0  Change in appetite 0 0 0 0 0  Feeling bad or failure about yourself  0 0 0 0 0  Trouble concentrating 0 0 0 0 0  Moving slowly or fidgety/restless 0 0 0 0 0  Suicidal thoughts 0 0 0 0 0  PHQ-9 Score 0 0  0  0  0   Difficult doing work/chores   Not difficult at all Not difficult at all Not difficult at all     Data saved with a previous flowsheet row definition    The patient does  not have a history of falls. I did complete a risk assessment for falls. A plan of care for falls was documented.   Past Medical History:  Past Medical History:  Diagnosis Date   Asthma    Diabetes mellitus without complication (HCC)    Dyspnea    Hypertension     Surgical History:  Past Surgical History:  Procedure Laterality Date   COLONOSCOPY     COLONOSCOPY WITH PROPOFOL  N/A 04/18/2022   Procedure: COLONOSCOPY WITH PROPOFOL ;  Surgeon: Therisa Bi, MD;  Location: [facility] ENDOSCOPY;  Service: Gastroenterology;  Laterality: N/A;   CYSTECTOMY Left    shoulder   TOTAL ABDOMINAL HYSTERECTOMY      Medications:  Current Outpatient Medications on File Prior to Visit  Medication Sig   albuterol  (VENTOLIN  HFA) 108 (90 Base) MCG/ACT inhaler INHALE 2 PUFFS BY MOUTH EVERY 6 HOURS AS NEEDED FOR WHEEZING OR FOR SHORTNESS OF BREATH   albuterol  (VENTOLIN  HFA) 108 (90 Base) MCG/ACT inhaler Inhale 2 puffs into the lungs every 6 (six) hours as needed for wheezing or  shortness of breath.   amLODipine  (NORVASC ) 10 MG tablet Take 1 tablet (10 mg total) by mouth daily.   aspirin 81 MG EC tablet Take by mouth daily.    carvedilol  (COREG ) 3.125 MG tablet TAKE 1 TABLET BY MOUTH TWICE A DAY WITH FOOD   glipiZIDE  (GLUCOTROL  XL) 2.5 MG 24 hr tablet Take 5 mg by mouth daily.   hydrALAZINE  (APRESOLINE ) 50 MG tablet Take 1 tablet (50 mg total) by mouth 2 (two) times daily.   hydrochlorothiazide  (HYDRODIURIL ) 12.5 MG tablet Take 12.5 mg by mouth daily.   potassium chloride  SA (KLOR-CON  M20) 20 MEQ tablet TAKE 1 TABLET BY MOUTH ONCE DAILY   pravastatin  (PRAVACHOL ) 40 MG tablet TAKE 1 TABLET BY MOUTH EVERY DAY   TRULICITY  4.5 MG/0.5ML SOAJ Inject 4.5 mg into the skin once a week.   VENTOLIN  HFA 108 (90 Base) MCG/ACT inhaler INHALE 2 PUFFS BY MOUTH EVERY 6 HOURS AS NEEDED FOR WHEEZING OR SHORTNESS OF BREATH   triamcinolone  ointment (KENALOG ) 0.1 % Apply 1 gram twice daily to affected areas of skin. Stop once resolved and restart as needed for flares. Avoid use on face, armpits, groin unless otherwise indicated. (Patient not taking: Reported on 04/22/2024)   No current facility-administered medications on file prior to visit.    Allergies:  Allergies  Allergen Reactions   Sulfa Antibiotics Hives   Sulfasalazine Hives and Other (See Comments)   Elemental Sulfur Other (See Comments)   Metformin  And Related Diarrhea   Metoprolol  Other (See Comments) and Cough   Lisinopril Cough    Social History:  Social History   Socioeconomic History   Marital status: Married    Spouse name: Not on file   Number of children: Not on file   Years of education: Not on file   Highest education level: Not on file  Occupational History   Not on file  Tobacco Use   Smoking status: Some Days    Types: Cigarettes   Smokeless tobacco: Never  Vaping Use   Vaping status: Never Used  Substance and Sexual Activity   Alcohol use: No   Drug use: No   Sexual activity: Yes  Other  Topics Concern   Not on file  Social History Narrative   Not on file   Social Drivers of Health   Tobacco Use: High Risk (04/22/2024)   Patient History    Smoking Tobacco Use: Some Days  Smokeless Tobacco Use: Never    Passive Exposure: Not on file  Financial Resource Strain: Low Risk  (03/23/2024)   Received from [facility]   Overall Financial Resource Strain (CARDIA)    Difficulty of Paying Living Expenses: Not very hard  Food Insecurity: No Food Insecurity (03/23/2024)   Received from [facility]   Epic    Within the past 12 months, you worried that your food would run out before you got the money to buy more.: Never true    Within the past 12 months, the food you bought just didn't last and you didn't have money to get more.: Never true  Transportation Needs: No Transportation Needs (03/23/2024)   Received from [facility] - Transportation    In the past 12 months, has lack of transportation kept you from medical appointments or from getting medications?: No    Lack of Transportation (Non-Medical): No  Physical Activity: Not on file  Stress: Not on file  Social Connections: Not on file  Intimate Partner Violence: Not on file  Depression (PHQ2-9): Low Risk (04/22/2024)   Depression (PHQ2-9)    PHQ-2 Score: 0  Alcohol Screen: Not on file  Housing: Low Risk  (03/23/2024)   Received from [facility]   Epic    In the last 12 months, was there a time when you were not able to pay the mortgage or rent on time?: No    In the past 12 months, how many times have you moved where you were living?: 0    At any time in the past 12 months, were you homeless or living in a shelter (including now)?: No  Utilities: Not At Risk (03/23/2024)   Received from [facility]   Epic    In the past 12 months has the electric, gas, oil, or water  company threatened to shut off services in your home?: No  Health  Literacy: Not on file   Social History   Tobacco Use  Smoking Status Some Days   Types: Cigarettes  Smokeless Tobacco Never   Social History   Substance and Sexual Activity  Alcohol Use No    Family History:  Family History  Problem Relation Age of Onset   Diabetes Mother    Prostate cancer Father    Heart disease Sister    Prostate cancer Brother    Prostate cancer Brother    Prostate cancer Maternal Grandfather    Breast cancer Neg Hx     Past medical history, surgical history, medications, allergies, family history and social history reviewed with patient today and changes made to appropriate areas of the chart.   Review of Systems  HENT:         Denies vision changes.  Eyes:  Negative for blurred vision and double vision.  Respiratory:  Negative for shortness of breath.   Cardiovascular:  Negative for chest pain, palpitations and leg swelling.  Neurological:  Positive for headaches. Negative for dizziness and tingling.  Endo/Heme/Allergies:  Negative for polydipsia.       Denies Polyuria   All other ROS negative except what is listed above and in the HPI.      Objective:    BP (!) 153/81 (Cuff Size: Normal)   Pulse 82   Temp 98 F (36.7 C) (Oral)   Ht 5' 4.02 (1.626 m)   Wt 180 lb 3.2 oz (81.7 kg)   SpO2 98%  BMI 30.92 kg/m   Wt Readings from Last 3 Encounters:  04/22/24 180 lb 3.2 oz (81.7 kg)  02/09/24 175 lb (79.4 kg)  11/01/23 182 lb 9.6 oz (82.8 kg)    Physical Exam Vitals and nursing note reviewed.  Constitutional:      General: She is not in acute distress.    Appearance: Normal appearance. She is not ill-appearing, toxic-appearing or diaphoretic.  HENT:     Head: Normocephalic.     Right Ear: Tympanic membrane and external ear normal.     Left Ear: Tympanic membrane and external ear normal.     Nose: Nose normal.     Mouth/Throat:     Mouth: Mucous membranes are moist.     Pharynx: Oropharynx is clear.  Eyes:     General:         Right eye: No discharge.        Left eye: No discharge.     Extraocular Movements: Extraocular movements intact.     Conjunctiva/sclera: Conjunctivae normal.     Pupils: Pupils are equal, round, and reactive to light.  Cardiovascular:     Rate and Rhythm: Normal rate and regular rhythm.     Heart sounds: No murmur heard. Pulmonary:     Effort: Pulmonary effort is normal. No respiratory distress.     Breath sounds: Normal breath sounds. No wheezing or rales.  Musculoskeletal:     Cervical back: Normal range of motion and neck supple.  Skin:    General: Skin is warm and dry.     Capillary Refill: Capillary refill takes less than 2 seconds.  Neurological:     General: No focal deficit present.     Mental Status: She is alert and oriented to person, place, and time. Mental status is at baseline.  Psychiatric:        Mood and Affect: Mood normal.        Behavior: Behavior normal.        Thought Content: Thought content normal.        Judgment: Judgment normal.     Results for orders placed or performed during the hospital encounter of 02/09/24  Urinalysis, Routine w reflex microscopic -Urine, Random   Collection Time: 02/09/24 11:03 AM  Result Value Ref Range   Color, Urine YELLOW (A) YELLOW   APPearance HAZY (A) CLEAR   Specific Gravity, Urine 1.017 1.005 - 1.030   pH 5.0 5.0 - 8.0   Glucose, UA NEGATIVE NEGATIVE mg/dL   Hgb urine dipstick NEGATIVE NEGATIVE   Bilirubin Urine NEGATIVE NEGATIVE   Ketones, ur NEGATIVE NEGATIVE mg/dL   Protein, ur 30 (A) NEGATIVE mg/dL   Nitrite NEGATIVE NEGATIVE   Leukocytes,Ua NEGATIVE NEGATIVE   RBC / HPF 0-5 0 - 5 RBC/hpf   WBC, UA 0-5 0 - 5 WBC/hpf   Bacteria, UA NONE SEEN NONE SEEN   Squamous Epithelial / HPF 0-5 0 - 5 /HPF   Mucus PRESENT       Assessment & Plan:   Problem List Items Addressed This Visit       Cardiovascular and Mediastinum   Hypertension associated with diabetes (HCC)   Chronic.  Controlled.  Continue with  current medication regimen of amlodipine , HCTZ, hydralazine  and carvedilol .  Followed by cardiology who is managing her medications.  Next appt is in 2 weeks. Labs ordered at visit today.  Return to clinic in 6 months for reevaluation.  Call sooner if concerns arise.  Chronic migraine without aura without status migrainosus, not intractable   Chronic.  Controlled.  Continue with current medication regimen of PRN use of Fioricet.  Labs ordered today.  Return to clinic in 6 months for reevaluation.  Call sooner if concerns arise.        Relevant Medications   butalbital -acetaminophen -caffeine  (FIORICET) 50-325-40 MG tablet     Endocrine   Type 2 diabetes mellitus with hyperglycemia (HCC)   Chronic. Not well controlled.  Last A1c was 9.7% with Endocrinology.  Currently taking Metformin , Glipizide  and Trulicity .  Labs ordered at visit today. Patient is now followed by Endocrinology.  Reports that she is taking Trulicity  4.5mg  weekly.  Reviewed last note from Endocrinology.  Continue to collaborate with specialist.  Follow up in 3 months.  Call sooner if concerns arise.   Patient has new insurance. Would like to get her on Mounjaro if covered.  Referral placed for pharmacy team.      Relevant Orders   Hemoglobin A1c   Microalbumin, Urine Waived   Hyperlipidemia associated with type 2 diabetes mellitus (HCC)   Chronic.  Controlled.  Continue with current medication regimen of Pravastatin  daily.  Labs ordered at visit today.  Return to clinic in 6 months for reevaluation.  Call sooner if concerns arise.       Relevant Orders   Lipid panel   Diabetes mellitus treated with injections of non-insulin medication (HCC)   Relevant Orders   AMB Referral VBCI Care Management   Other Visit Diagnoses       Annual physical exam    -  Primary   Health maintenance reviewed during visit today.  Labs ordered.  Vaccines reviewed.  COlonoscopy and Mammogram up to date.   Relevant Orders   CBC with  Differential/Platelet   Comprehensive metabolic panel with GFR   Lipid panel   TSH   Hemoglobin A1c   Microalbumin, Urine Waived        Follow up plan: Return in about 3 months (around 07/20/2024) for HTN, HLD, DM2 FU and MWV.   LABORATORY TESTING:  - Pap smear: not applicable  IMMUNIZATIONS:   - Tdap: Tetanus vaccination status reviewed: last tetanus booster within 10 years. - Influenza: Up to date - Pneumovax: Up to date - Prevnar: Up to date - COVID: Not applicable - HPV: Not applicable - Shingrix  vaccine: Refused  SCREENING: -Mammogram: Ordered today  - Colonoscopy: Ordered today  - Bone Density: Not applicable  -Hearing Test: Not applicable  -Spirometry: Not applicable   PATIENT COUNSELING:   Advised to take 1 mg of folate supplement per day if capable of pregnancy.   Sexuality: Discussed sexually transmitted diseases, partner selection, use of condoms, avoidance of unintended pregnancy  and contraceptive alternatives.   Advised to avoid cigarette smoking.  I discussed with the patient that most people either abstain from alcohol or drink within safe limits (<=14/week and <=4 drinks/occasion for males, <=7/weeks and <= 3 drinks/occasion for females) and that the risk for alcohol disorders and other health effects rises proportionally with the number of drinks per week and how often a drinker exceeds daily limits.  Discussed cessation/primary prevention of drug use and availability of treatment for abuse.   Diet: Encouraged to adjust caloric intake to maintain  or achieve ideal body weight, to reduce intake of dietary saturated fat and total fat, to limit sodium intake by avoiding high sodium foods and not adding table salt, and to maintain adequate dietary potassium and calcium preferably from  fresh fruits, vegetables, and low-fat dairy products.    stressed the importance of regular exercise  Injury prevention: Discussed safety belts, safety helmets, smoke  detector, smoking near bedding or upholstery.   Dental health: Discussed importance of regular tooth brushing, flossing, and dental visits.    NEXT PREVENTATIVE PHYSICAL DUE IN 1 YEAR. Return in about 3 months (around 07/20/2024) for HTN, HLD, DM2 FU and MWV.          "

## 2024-04-22 NOTE — Assessment & Plan Note (Signed)
 Chronic. Not well controlled.  Last A1c was 9.7% with Endocrinology.  Currently taking Metformin , Glipizide  and Trulicity .  Labs ordered at visit today. Patient is now followed by Endocrinology.  Reports that she is taking Trulicity  4.5mg  weekly.  Reviewed last note from Endocrinology.  Continue to collaborate with specialist.  Follow up in 3 months.  Call sooner if concerns arise.   Patient has new insurance. Would like to get her on Mounjaro if covered.  Referral placed for pharmacy team.

## 2024-04-22 NOTE — Assessment & Plan Note (Addendum)
 Chronic.  Controlled.  Continue with current medication regimen of amlodipine , HCTZ, hydralazine  and carvedilol .  Followed by cardiology who is managing her medications.  Next appt is in 2 weeks. Labs ordered at visit today.  Return to clinic in 6 months for reevaluation.  Call sooner if concerns arise.

## 2024-04-22 NOTE — Assessment & Plan Note (Signed)
 Chronic.  Controlled.  Continue with current medication regimen of Pravastatin  daily.  Labs ordered at visit today.  Return to clinic in 6 months for reevaluation.  Call sooner if concerns arise.

## 2024-04-23 ENCOUNTER — Telehealth: Payer: Self-pay

## 2024-04-23 ENCOUNTER — Ambulatory Visit: Payer: Self-pay | Admitting: Nurse Practitioner

## 2024-04-23 LAB — COMPREHENSIVE METABOLIC PANEL WITH GFR
ALT: 14 [IU]/L (ref 0–32)
AST: 12 [IU]/L (ref 0–40)
Albumin: 4.3 g/dL (ref 3.9–4.9)
Alkaline Phosphatase: 87 [IU]/L (ref 49–135)
BUN/Creatinine Ratio: 21 (ref 12–28)
BUN: 15 mg/dL (ref 8–27)
Bilirubin Total: 0.4 mg/dL (ref 0.0–1.2)
CO2: 25 mmol/L (ref 20–29)
Calcium: 9.7 mg/dL (ref 8.7–10.3)
Chloride: 100 mmol/L (ref 96–106)
Creatinine, Ser: 0.7 mg/dL (ref 0.57–1.00)
Globulin, Total: 2.6 g/dL (ref 1.5–4.5)
Glucose: 162 mg/dL — ABNORMAL HIGH (ref 70–99)
Potassium: 3.3 mmol/L — ABNORMAL LOW (ref 3.5–5.2)
Sodium: 141 mmol/L (ref 134–144)
Total Protein: 6.9 g/dL (ref 6.0–8.5)
eGFR: 96 mL/min/{1.73_m2}

## 2024-04-23 LAB — CBC WITH DIFFERENTIAL/PLATELET
Basophils Absolute: 0.1 10*3/uL (ref 0.0–0.2)
Basos: 1 %
EOS (ABSOLUTE): 0.1 10*3/uL (ref 0.0–0.4)
Eos: 2 %
Hematocrit: 42.5 % (ref 34.0–46.6)
Hemoglobin: 13.2 g/dL (ref 11.1–15.9)
Immature Grans (Abs): 0 10*3/uL (ref 0.0–0.1)
Immature Granulocytes: 0 %
Lymphocytes Absolute: 1.8 10*3/uL (ref 0.7–3.1)
Lymphs: 28 %
MCH: 27.8 pg (ref 26.6–33.0)
MCHC: 31.1 g/dL — ABNORMAL LOW (ref 31.5–35.7)
MCV: 90 fL (ref 79–97)
Monocytes Absolute: 0.4 10*3/uL (ref 0.1–0.9)
Monocytes: 6 %
Neutrophils Absolute: 4 10*3/uL (ref 1.4–7.0)
Neutrophils: 63 %
Platelets: 323 10*3/uL (ref 150–450)
RBC: 4.75 x10E6/uL (ref 3.77–5.28)
RDW: 14 % (ref 11.7–15.4)
WBC: 6.4 10*3/uL (ref 3.4–10.8)

## 2024-04-23 LAB — LIPID PANEL
Chol/HDL Ratio: 3.5 ratio (ref 0.0–4.4)
Cholesterol, Total: 199 mg/dL (ref 100–199)
HDL: 57 mg/dL
LDL Chol Calc (NIH): 125 mg/dL — ABNORMAL HIGH (ref 0–99)
Triglycerides: 95 mg/dL (ref 0–149)
VLDL Cholesterol Cal: 17 mg/dL (ref 5–40)

## 2024-04-23 LAB — HEMOGLOBIN A1C
Est. average glucose Bld gHb Est-mCnc: 203 mg/dL
Hgb A1c MFr Bld: 8.7 % — ABNORMAL HIGH (ref 4.8–5.6)

## 2024-04-23 LAB — TSH: TSH: 0.549 u[IU]/mL (ref 0.450–4.500)

## 2024-04-23 NOTE — Progress Notes (Unsigned)
 Complex Care Management Note Care Guide Note  04/23/2024 Name: Lacey Schaefer MRN: [id number] DOB: 19-Sep-1960   Complex Care Management Outreach Attempts: An unsuccessful telephone outreach was attempted today to offer the patient information about available complex care management services.  Follow Up Plan:  Additional outreach attempts will be made to offer the patient complex care management information and services.   Encounter Outcome:  No Answer  Dreama [facility]  [facility], [facility] VBCI Assistant Direct Dial: [phone]  Fax: [phone]

## 2024-04-30 ENCOUNTER — Ambulatory Visit: Payer: Self-pay | Admitting: Occupational Therapy

## 2024-07-21 ENCOUNTER — Ambulatory Visit: Admitting: Nurse Practitioner
